# Patient Record
Sex: Male | Born: 1970 | Race: Black or African American | Hispanic: No | Marital: Married | Smoking: Current every day smoker
Health system: Southern US, Community
[De-identification: ages and names within clinical notes are randomized; demographics above are authoritative.]

## PROBLEM LIST (undated history)

## (undated) ENCOUNTER — Emergency Department (HOSPITAL_BASED_OUTPATIENT_CLINIC_OR_DEPARTMENT_OTHER): Admission: EM | Payer: BC Managed Care – PPO | Source: Home / Self Care

## (undated) DIAGNOSIS — E119 Type 2 diabetes mellitus without complications: Secondary | ICD-10-CM

## (undated) DIAGNOSIS — I10 Essential (primary) hypertension: Secondary | ICD-10-CM

## (undated) HISTORY — PX: TONSILLECTOMY: SUR1361

## (undated) HISTORY — PX: ANKLE SURGERY: SHX546

## (undated) HISTORY — PX: CHOLECYSTECTOMY: SHX55

---

## 2013-02-07 ENCOUNTER — Emergency Department (HOSPITAL_COMMUNITY)
Admission: EM | Admit: 2013-02-07 | Discharge: 2013-02-07 | Disposition: A | Discharge status: Home / Self Care | Payer: Self-pay | Attending: Emergency Medicine | Admitting: Emergency Medicine

## 2013-02-07 ENCOUNTER — Encounter (HOSPITAL_COMMUNITY): Payer: Self-pay | Admitting: Emergency Medicine

## 2013-02-07 DIAGNOSIS — L84 Corns and callosities: Secondary | ICD-10-CM | POA: Insufficient documentation

## 2013-02-07 DIAGNOSIS — L600 Ingrowing nail: Secondary | ICD-10-CM | POA: Insufficient documentation

## 2013-02-07 DIAGNOSIS — F17200 Nicotine dependence, unspecified, uncomplicated: Secondary | ICD-10-CM | POA: Insufficient documentation

## 2013-02-07 DIAGNOSIS — W57XXXA Bitten or stung by nonvenomous insect and other nonvenomous arthropods, initial encounter: Secondary | ICD-10-CM

## 2013-02-07 DIAGNOSIS — I10 Essential (primary) hypertension: Secondary | ICD-10-CM | POA: Insufficient documentation

## 2013-02-07 HISTORY — DX: Essential (primary) hypertension: I10

## 2013-02-07 NOTE — ED Provider Notes (Signed)
Medical screening examination/treatment/procedure(s) were performed by non-physician practitioner and as supervising physician I was immediately available for consultation/collaboration.  Keiran Sias M Aaryn Sermon, MD 02/07/13 0410 

## 2013-02-07 NOTE — ED Provider Notes (Signed)
History     CSN: [id number]  Arrival date & time 02/07/13  0151   First MD Initiated Contact with Patient 02/07/13 0242      Chief Complaint  Patient presents with  . Ingrown Toenail  . Insect Bite    (Consider location/radiation/quality/duration/timing/severity/associated sxs/prior treatment) HPI Comments: Patient presents to the emergency room tonight, at 3 AM because he has a callus on the lateral aspect of his right fifth toe.  That has been painful for the past 2, states she's tried soaking it and the callus.  Has not fallen off.  He has not trimmed or file.  This area at all nor cut his toenail because it is "too painful."  He states while he was in prison.  He had an ingrown, toenail on that toe, and they supposedly burned.  The nail off, but it has regrown.  He also has an area on his left flank.  There is a small, punctate area.  That is open.  He, states he had a pimple-like appearance, that he scratched.  Now the head is open.  There is no drainage, pus, or surrounding erythema  The history is provided by the patient.    Past Medical History  Diagnosis Date  . Hypertension     Past Surgical History  Procedure Laterality Date  . Ankle surgery      No family history on file.  History  Substance Use Topics  . Smoking status: Current Every Day Smoker  . Smokeless tobacco: Not on file  . Alcohol Use: Yes     Comment: occ      Review of Systems  Constitutional: Negative for fever.  Respiratory: Negative for shortness of breath.   Cardiovascular: Negative for chest pain.  Musculoskeletal: Negative for joint swelling.  Skin: Positive for wound.  All other systems reviewed and are negative.    Allergies  Review of patient's allergies indicates no known allergies.  Home Medications  No current outpatient prescriptions on file.  BP 149/95  Pulse 96  Temp(Src) 98.2 F (36.8 C) (Oral)  Resp 17  Ht 6\' 6"  (1.981 m)  Wt 346 lb (156.945 kg)  BMI 39.99 kg/m2   SpO2 97%  Physical Exam  Nursing note and vitals reviewed. Constitutional: He is oriented to person, place, and time. He appears well-developed and well-nourished.  Obese  HENT:  Head: Normocephalic and atraumatic.  Eyes: Pupils are equal, round, and reactive to light.  Cardiovascular: Normal rate.   Pulmonary/Chest: He is in respiratory distress.    Musculoskeletal: Normal range of motion. He exhibits tenderness. He exhibits no edema.       Feet:  Neurological: He is alert and oriented to person, place, and time.  Skin: Skin is warm and dry. No rash noted. No erythema. No pallor.    ED Course  Procedures (including critical care time)  Labs Reviewed - No data to display No results found.   1. Callus of foot   2. Insect bite       MDM   Patient was instructed in care of cutting his toenails and how to care for thickened callus on his toes and feet Is no indication of ingrown toenails.  There is no erythema, swelling, pus, in the cuticle area or around the toenail      Arman Filter, NP 02/07/13 [id number]  Arman Filter, NP 02/07/13 [phone]

## 2013-02-07 NOTE — ED Notes (Signed)
Pt c/o painful ingrown toenail to R 5th toe x 2 weeks and ?insect bite to L side.

## 2014-08-10 ENCOUNTER — Encounter (HOSPITAL_COMMUNITY): Payer: Self-pay | Admitting: Emergency Medicine

## 2014-08-10 ENCOUNTER — Emergency Department (HOSPITAL_COMMUNITY)
Admission: EM | Admit: 2014-08-10 | Discharge: 2014-08-11 | Disposition: A | Discharge status: Home / Self Care | Payer: Medicaid Other | Attending: Emergency Medicine | Admitting: Emergency Medicine

## 2014-08-10 DIAGNOSIS — Z720 Tobacco use: Secondary | ICD-10-CM | POA: Diagnosis not present

## 2014-08-10 DIAGNOSIS — E1165 Type 2 diabetes mellitus with hyperglycemia: Secondary | ICD-10-CM | POA: Diagnosis not present

## 2014-08-10 DIAGNOSIS — L739 Follicular disorder, unspecified: Secondary | ICD-10-CM | POA: Diagnosis not present

## 2014-08-10 DIAGNOSIS — L02412 Cutaneous abscess of left axilla: Secondary | ICD-10-CM | POA: Diagnosis present

## 2014-08-10 DIAGNOSIS — I10 Essential (primary) hypertension: Secondary | ICD-10-CM | POA: Diagnosis not present

## 2014-08-10 DIAGNOSIS — R739 Hyperglycemia, unspecified: Secondary | ICD-10-CM

## 2014-08-10 HISTORY — DX: Type 2 diabetes mellitus without complications: E11.9

## 2014-08-10 LAB — CBG MONITORING, ED: Glucose-Capillary: 267 mg/dL — ABNORMAL HIGH (ref 70–99)

## 2014-08-10 NOTE — ED Notes (Signed)
Pt c/o abscesses to abdomen, legs, back, axillary area. Some draining. Pt is diabetic has not had meds in over a month. Pt also states he has BM's 6-7 x a day.

## 2014-08-11 LAB — BASIC METABOLIC PANEL
Anion gap: 15 (ref 5–15)
BUN: 14 mg/dL (ref 6–23)
CHLORIDE: 96 meq/L (ref 96–112)
CO2: 23 meq/L (ref 19–32)
Calcium: 9.3 mg/dL (ref 8.4–10.5)
Creatinine, Ser: 1.05 mg/dL (ref 0.50–1.35)
GFR calc Af Amer: >90 mL/min (ref >90)
GFR calc non Af Amer: 86 mL/min — ABNORMAL LOW (ref >90)
GLUCOSE: 294 mg/dL — AB (ref 70–99)
POTASSIUM: 3.9 meq/L (ref 3.7–5.3)
Sodium: 134 mEq/L — ABNORMAL LOW (ref 137–147)

## 2014-08-11 MED ORDER — METFORMIN HCL 500 MG PO TABS: 500.0000 mg | ORAL_TABLET | Freq: Two times a day (BID) | ORAL | Status: DC

## 2014-08-11 MED ORDER — DOXYCYCLINE HYCLATE 100 MG PO TABS
100.0000 mg | ORAL_TABLET | Freq: Once | ORAL | Status: AC
  Administered 2014-08-11: 100 mg via ORAL
  Filled 2014-08-11: qty 1

## 2014-08-11 MED ORDER — OXYCODONE-ACETAMINOPHEN 5-325 MG PO TABS
1.0000 | ORAL_TABLET | Freq: Once | ORAL | Status: AC
  Administered 2014-08-11: 1 via ORAL
  Filled 2014-08-11: qty 1

## 2014-08-11 MED ORDER — DOXYCYCLINE HYCLATE 100 MG PO TABS: 100.0000 mg | ORAL_TABLET | Freq: Two times a day (BID) | ORAL | Status: DC

## 2014-08-11 MED ORDER — METFORMIN HCL 1000 MG PO TABS: 1000.0000 mg | ORAL_TABLET | Freq: Two times a day (BID) | ORAL | Status: DC

## 2014-08-11 MED ORDER — HYDROCODONE-ACETAMINOPHEN 5-325 MG PO TABS: 1.0000 | ORAL_TABLET | ORAL | Status: DC | PRN

## 2014-08-11 NOTE — ED Provider Notes (Signed)
CSN: [id number]     Arrival date & time 08/10/14  2322 History   First MD Initiated Contact with Patient 08/11/14 0007     Chief Complaint  Patient presents with  . Abscess     (Consider location/radiation/quality/duration/timing/severity/associated sxs/prior Treatment) Patient is a 43 y.o. male presenting with abscess. The history is provided by the patient and the spouse. No language interpreter was used.  Abscess Location:  Torso Torso abscess location:  L axilla and R axilla Red streaking: no   Duration:  1 week Associated symptoms: no fever   Associated symptoms comment:  Multiple painful, swollen areas to lateral torso bilaterally. No fever. He reports small blisters associated with some of the areas. He has had similar symptoms in the past. He reports a history of diabetes and that he has not had his Metformin in over one month. No nausea, vomiting. He does report thirst and urinary frequency, as well as multiple well formed stools throughout the day.   Past Medical History  Diagnosis Date  . Hypertension   . Diabetes mellitus without complication    Past Surgical History  Procedure Laterality Date  . Ankle surgery     No family history on file. History  Substance Use Topics  . Smoking status: Current Every Day Smoker  . Smokeless tobacco: Not on file  . Alcohol Use: Yes     Comment: occ    Review of Systems  Constitutional: Negative for fever and chills.  HENT: Negative.   Respiratory: Negative.   Cardiovascular: Negative.   Gastrointestinal: Negative.   Endocrine: Positive for polydipsia and polyuria.  Musculoskeletal: Negative.   Skin: Positive for rash.  Neurological: Negative.       Allergies  Review of patient's allergies indicates no known allergies.  Home Medications   Prior to Admission medications   Medication Sig Start Date End Date Taking? Authorizing Provider  ibuprofen (ADVIL,MOTRIN) 200 MG tablet Take 400 mg by mouth every 6 (six)  hours as needed for moderate pain.   Yes Historical Provider, MD   BP 157/96 mmHg  Pulse 100  Temp(Src) 97.8 F (36.6 C) (Oral)  Resp 20  Ht 6\' 7"  (2.007 m)  Wt 330 lb (149.687 kg)  BMI 37.16 kg/m2  SpO2 98% Physical Exam  Constitutional: He is oriented to person, place, and time. He appears well-developed and well-nourished.  Neck: Normal range of motion.  Pulmonary/Chest: Effort normal.  Musculoskeletal: Normal range of motion.  Neurological: He is alert and oriented to person, place, and time.  Skin: Skin is warm and dry.  Multiple red, tender, swollen areas, many with small pustules that are intact c/w folliculitis. No drainable abscesses.   Psychiatric: He has a normal mood and affect.    ED Course  Procedures (including critical care time) Labs Review Labs Reviewed  BASIC METABOLIC PANEL - Abnormal; Notable for the following:    Sodium 134 (*)    Glucose, Bld 294 (*)    GFR calc non Af Amer 86 (*)    All other components within normal limits  CBG MONITORING, ED - Abnormal; Notable for the following:    Glucose-Capillary 267 (*)    All other components within normal limits    Imaging Review No results found.   EKG Interpretation None      MDM   Final diagnoses:  None    1. Folliculitis 2. Uncontrolled DM w/o acidosis  He is very well appearing, NAD. No evidence of acidosis though hyperglycemia of 294.  He states he drank to regular sodas prior to arrival in the ED. He is stable for discharge home.     Arnoldo HookerShari A Shanvi Moyd, PA-C 08/11/14 [phone]  Ward GivensIva L Knapp, MD 08/11/14 [phone]

## 2014-08-11 NOTE — Discharge Instructions (Signed)
Hyperglycemia °Hyperglycemia occurs when the glucose (sugar) in your blood is too high. Hyperglycemia can happen for many reasons, but it most often happens to people who do not know they have diabetes or are not managing their diabetes properly.  °CAUSES  °Whether you have diabetes or not, there are other causes of hyperglycemia. Hyperglycemia can occur when you have diabetes, but it can also occur in other situations that you might not be as aware of, such as: °Diabetes °· If you have diabetes and are having problems controlling your blood glucose, hyperglycemia could occur because of some of the following reasons: °¨ Not following your meal plan. °¨ Not taking your diabetes medications or not taking it properly. °¨ Exercising less or doing less activity than you normally do. °¨ Being sick. °Pre-diabetes °· This cannot be ignored. Before people develop Type 2 diabetes, they almost always have "pre-diabetes." This is when your blood glucose levels are higher than normal, but not yet high enough to be diagnosed as diabetes. Research has shown that some long-term damage to the body, especially the heart and circulatory system, may already be occurring during pre-diabetes. If you take action to manage your blood glucose when you have pre-diabetes, you may delay or prevent Type 2 diabetes from developing. °Stress °· If you have diabetes, you may be "diet" controlled or on oral medications or insulin to control your diabetes. However, you may find that your blood glucose is higher than usual in the hospital whether you have diabetes or not. This is often referred to as "stress hyperglycemia." Stress can elevate your blood glucose. This happens because of hormones put out by the body during times of stress. If stress has been the cause of your high blood glucose, it can be followed regularly by your caregiver. That way he/she can make sure your hyperglycemia does not continue to get worse or progress to  diabetes. °Steroids °· Steroids are medications that act on the infection fighting system (immune system) to block inflammation or infection. One side effect can be a rise in blood glucose. Most people can produce enough extra insulin to allow for this rise, but for those who cannot, steroids make blood glucose levels go even higher. It is not unusual for steroid treatments to "uncover" diabetes that is developing. It is not always possible to determine if the hyperglycemia will go away after the steroids are stopped. A special blood test called an A1c is sometimes done to determine if your blood glucose was elevated before the steroids were started. °SYMPTOMS °· Thirsty. °· Frequent urination. °· Dry mouth. °· Blurred vision. °· Tired or fatigue. °· Weakness. °· Sleepy. °· Tingling in feet or leg. °DIAGNOSIS  °Diagnosis is made by monitoring blood glucose in one or all of the following ways: °· A1c test. This is a chemical found in your blood. °· Fingerstick blood glucose monitoring. °· Laboratory results. °TREATMENT  °First, knowing the cause of the hyperglycemia is important before the hyperglycemia can be treated. Treatment may include, but is not be limited to: °· Education. °· Change or adjustment in medications. °· Change or adjustment in meal plan. °· Treatment for an illness, infection, etc. °· More frequent blood glucose monitoring. °· Change in exercise plan. °· Decreasing or stopping steroids. °· Lifestyle changes. °HOME CARE INSTRUCTIONS  °· Test your blood glucose as directed. °· Exercise regularly. Your caregiver will give you instructions about exercise. Pre-diabetes or diabetes which comes on with stress is helped by exercising. °· Eat wholesome,   balanced meals. Eat often and at regular, fixed times. Your caregiver or nutritionist will give you a meal plan to guide your sugar intake.  Being at an ideal weight is important. If needed, losing as little as 10 to 15 pounds may help improve blood  glucose levels. SEEK MEDICAL CARE IF:   You have questions about medicine, activity, or diet.  You continue to have symptoms (problems such as increased thirst, urination, or weight gain). SEEK IMMEDIATE MEDICAL CARE IF:   You are vomiting or have diarrhea.  Your breath smells fruity.  You are breathing faster or slower.  You are very sleepy or incoherent.  You have numbness, tingling, or pain in your feet or hands.  You have chest pain.  Your symptoms get worse even though you have been following your caregiver's orders.  If you have any other questions or concerns. Document Released: 02/01/2001 Document Revised: 10/31/2011 Document Reviewed: 12/05/2011 [facility] Patient Information 2015 [place], [place]. This information is not intended to replace advice given to you by your health care provider. Make sure you discuss any questions you have with your health care provider. Folliculitis  Folliculitis is redness, soreness, and swelling (inflammation) of the hair follicles. This condition can occur anywhere on the body. People with weakened immune systems, diabetes, or obesity have a greater risk of getting folliculitis. CAUSES  Bacterial infection. This is the most common cause.  Fungal infection.  Viral infection.  Contact with certain chemicals, especially oils and tars. Long-term folliculitis can result from bacteria that live in the nostrils. The bacteria may trigger multiple outbreaks of folliculitis over time. SYMPTOMS Folliculitis most commonly occurs on the scalp, thighs, legs, back, buttocks, and areas where hair is shaved frequently. An early sign of folliculitis is a small, white or yellow, pus-filled, itchy lesion (pustule). These lesions appear on a red, inflamed follicle. They are usually less than 0.2 inches (5 mm) wide. When there is an infection of the follicle that goes deeper, it becomes a boil or furuncle. A group of closely packed boils creates a larger lesion  (carbuncle). Carbuncles tend to occur in hairy, sweaty areas of the body. DIAGNOSIS  Your caregiver can usually tell what is wrong by doing a physical exam. A sample may be taken from one of the lesions and tested in a lab. This can help determine what is causing your folliculitis. TREATMENT  Treatment may include:  Applying warm compresses to the affected areas.  Taking antibiotic medicines orally or applying them to the skin.  Draining the lesions if they contain a large amount of pus or fluid.  Laser hair removal for cases of long-lasting folliculitis. This helps to prevent regrowth of the hair. HOME CARE INSTRUCTIONS  Apply warm compresses to the affected areas as directed by your caregiver.  If antibiotics are prescribed, take them as directed. Finish them even if you start to feel better.  You may take over-the-counter medicines to relieve itching.  Do not shave irritated skin.  Follow up with your caregiver as directed. SEEK IMMEDIATE MEDICAL CARE IF:   You have increasing redness, swelling, or pain in the affected area.  You have a fever. MAKE SURE YOU:  Understand these instructions.  Will watch your condition.  Will get help right away if you are not doing well or get worse. Document Released: 10/17/2001 Document Revised: 02/07/2012 Document Reviewed: 11/08/2011 [facility] Patient Information 2015 [place], [place]. This information is not intended to replace advice given to you by your health care provider.  Make sure you discuss any questions you have with your health care provider. ° °

## 2014-09-18 ENCOUNTER — Encounter (HOSPITAL_COMMUNITY): Payer: Self-pay | Admitting: *Deleted

## 2014-09-18 ENCOUNTER — Emergency Department (HOSPITAL_COMMUNITY)
Admission: EM | Admit: 2014-09-18 | Discharge: 2014-09-18 | Disposition: A | Discharge status: Home / Self Care | Payer: Medicaid Other | Attending: Emergency Medicine | Admitting: Emergency Medicine

## 2014-09-18 DIAGNOSIS — E119 Type 2 diabetes mellitus without complications: Secondary | ICD-10-CM | POA: Insufficient documentation

## 2014-09-18 DIAGNOSIS — Z79899 Other long term (current) drug therapy: Secondary | ICD-10-CM | POA: Diagnosis not present

## 2014-09-18 DIAGNOSIS — Z792 Long term (current) use of antibiotics: Secondary | ICD-10-CM | POA: Diagnosis not present

## 2014-09-18 DIAGNOSIS — L739 Follicular disorder, unspecified: Secondary | ICD-10-CM | POA: Diagnosis not present

## 2014-09-18 DIAGNOSIS — I10 Essential (primary) hypertension: Secondary | ICD-10-CM | POA: Diagnosis not present

## 2014-09-18 DIAGNOSIS — Z720 Tobacco use: Secondary | ICD-10-CM | POA: Diagnosis not present

## 2014-09-18 DIAGNOSIS — L02412 Cutaneous abscess of left axilla: Secondary | ICD-10-CM | POA: Diagnosis present

## 2014-09-18 MED ORDER — HYDROCODONE-ACETAMINOPHEN 5-325 MG PO TABS: 1.0000 | ORAL_TABLET | Freq: Four times a day (QID) | ORAL | Status: DC | PRN

## 2014-09-18 MED ORDER — DOXYCYCLINE HYCLATE 100 MG PO TABS: 100.0000 mg | ORAL_TABLET | Freq: Two times a day (BID) | ORAL | Status: DC

## 2014-09-18 NOTE — ED Provider Notes (Signed)
CSN: [id number]     Arrival date & time 09/18/14  1810 History   None    This chart was scribed for non-physician practitioner, Fayrene HelperBowie Luv Mish PA-C working with Gwyneth SproutWhitney Plunkett, MD by Arlan OrganAshley Leger, ED Scribe. This patient was seen in room WTR6/WTR6 and the patient's care was started at 6:23 PM.   No chief complaint on file.  HPI  HPI Comments: Lawrence Fisher is a 44 y.o. male with a PMHx of HTN and DM who presents to the Emergency Department complaining of multiple, ongoing, painful boils below axilla bilaterally, hips, buttocks, and L lower leg x 1 month. Discomfort is worsened with palpation and with certain positioning. Pt was seen previously approximately 1 month ago for same and was prescribed a short course of an antibiotic. Mr. Pernell Dupredams states he completed all antibiotic prescribed at last visit without any improvement for symptoms. He admits to sitting in hot jacuzzis once a month. No recent fever or chills. Pt is taking Metformin daily and admits to new onset frequent urination and bowel movements since starting medication. He is not currently established with a PCP as he is having difficulty finding a provider that will accept his form of insurance. No known allergies to medications.  Past Medical History  Diagnosis Date  . Hypertension   . Diabetes mellitus without complication    Past Surgical History  Procedure Laterality Date  . Ankle surgery     No family history on file. History  Substance Use Topics  . Smoking status: Current Every Day Smoker  . Smokeless tobacco: Not on file  . Alcohol Use: Yes     Comment: occ    Review of Systems  Constitutional: Negative for fever and chills.  Skin: Positive for rash.      Allergies  Review of patient's allergies indicates no known allergies.  Home Medications   Prior to Admission medications   Medication Sig Start Date End Date Taking? Authorizing Provider  doxycycline (VIBRA-TABS) 100 MG tablet Take 1 tablet (100 mg total) by  mouth 2 (two) times daily. 08/11/14   Shari A Upstill, PA-C  HYDROcodone-acetaminophen (NORCO/VICODIN) 5-325 MG per tablet Take 1-2 tablets by mouth every 4 (four) hours as needed. 08/11/14   Shari A Upstill, PA-C  ibuprofen (ADVIL,MOTRIN) 200 MG tablet Take 400 mg by mouth every 6 (six) hours as needed for moderate pain.    Historical Provider, MD  metFORMIN (GLUCOPHAGE) 1000 MG tablet Take 1 tablet (1,000 mg total) by mouth 2 (two) times daily. 08/11/14   Shari A Upstill, PA-C  metFORMIN (GLUCOPHAGE) 500 MG tablet Take 1 tablet (500 mg total) by mouth 2 (two) times daily with a meal. 08/11/14   Shari A Upstill, PA-C   Triage Vitals: BP 155/96 mmHg  Pulse 108  Temp(Src) 97.9 F (36.6 C) (Oral)  Resp 18  SpO2 98%   Physical Exam  Constitutional: He is oriented to person, place, and time. He appears well-developed and well-nourished.  HENT:  Head: Normocephalic.  Mouth/Throat: Oropharynx is clear and moist.  Oral mucosa free of lesions  Eyes: EOM are normal.  Neck: Normal range of motion.  Cardiovascular: Normal rate, regular rhythm and normal heart sounds.   Pulmonary/Chest: Effort normal.  Abdominal: He exhibits no distension.  Musculoskeletal: Normal range of motion.  Neurological: He is alert and oriented to person, place, and time.  Skin: Rash noted.  Folliculitis rash noted bilateral axilla region, along trunk, chest, bilateral lower extremities, hips, and buttocks that are tenderness to palpation  Some lesion with pustular discharge  Psychiatric: He has a normal mood and affect.  Nursing note and vitals reviewed.   ED Course  Procedures (including critical care time)  DIAGNOSTIC STUDIES: Oxygen Saturation is 98% on RA, Normal by my interpretation.    COORDINATION OF CARE: 6:23 PM- evidence of folliculitis on exam, no discrete large abscess amenable for drainage. Will prescribe longer course of antibiotic and advised pt to establish with a PCP to manage DM and HTN.  Discussed treatment plan with pt at bedside and pt agreed to plan.     Labs Review Labs Reviewed - No data to display  Imaging Review No results found.   EKG Interpretation None      MDM   Final diagnoses:  Folliculitis    BP 155/96 mmHg  Pulse 103  Temp(Src) 98.5 F (36.9 C) (Oral)  Resp 18  SpO2 96%  I personally performed the services described in this documentation, which was scribed in my presence. The recorded information has been reviewed and is accurate.    [name], PA-C 09/18/14 [id number]  Gwyneth Sprout, MD 09/19/14 0002

## 2014-09-18 NOTE — ED Notes (Signed)
Pt reports abscesses in several areas in his body.  Reports one in bila axillary, L leg, and buttocks x 1 month.  Pt reports they are intermittent.  Pt is also diabetic.

## 2014-09-18 NOTE — Discharge Instructions (Signed)
Folliculitis  Folliculitis is redness, soreness, and swelling (inflammation) of the hair follicles. This condition can occur anywhere on the body. People with weakened immune systems, diabetes, or obesity have a greater risk of getting folliculitis. CAUSES  Bacterial infection. This is the most common cause.  Fungal infection.  Viral infection.  Contact with certain chemicals, especially oils and tars. Long-term folliculitis can result from bacteria that live in the nostrils. The bacteria may trigger multiple outbreaks of folliculitis over time. SYMPTOMS Folliculitis most commonly occurs on the scalp, thighs, legs, back, buttocks, and areas where hair is shaved frequently. An early sign of folliculitis is a small, white or yellow, pus-filled, itchy lesion (pustule). These lesions appear on a red, inflamed follicle. They are usually less than 0.2 inches (5 mm) wide. When there is an infection of the follicle that goes deeper, it becomes a boil or furuncle. A group of closely packed boils creates a larger lesion (carbuncle). Carbuncles tend to occur in hairy, sweaty areas of the body. DIAGNOSIS  Your caregiver can usually tell what is wrong by doing a physical exam. A sample may be taken from one of the lesions and tested in a lab. This can help determine what is causing your folliculitis. TREATMENT  Treatment may include:  Applying warm compresses to the affected areas.  Taking antibiotic medicines orally or applying them to the skin.  Draining the lesions if they contain a large amount of pus or fluid.  Laser hair removal for cases of long-lasting folliculitis. This helps to prevent regrowth of the hair. HOME CARE INSTRUCTIONS  Apply warm compresses to the affected areas as directed by your caregiver.  If antibiotics are prescribed, take them as directed. Finish them even if you start to feel better.  You may take over-the-counter medicines to relieve itching.  Do not shave  irritated skin.  Follow up with your caregiver as directed. SEEK IMMEDIATE MEDICAL CARE IF:   You have increasing redness, swelling, or pain in the affected area.  You have a fever. MAKE SURE YOU:  Understand these instructions.  Will watch your condition.  Will get help right away if you are not doing well or get worse. Document Released: 10/17/2001 Document Revised: 02/07/2012 Document Reviewed: 11/08/2011 [facility] Patient Information 2015 [place], [place]. This information is not intended to replace advice given to you by your health care provider. Make sure you discuss any questions you have with your health care provider.   Emergency Department Resource Guide 1) Find a Doctor and Pay Out of Pocket Although you won't have to find out who is covered by your insurance plan, it is a good idea to ask around and get recommendations. You will then need to call the office and see if the doctor you have chosen will accept you as a new patient and what types of options they offer for patients who are self-pay. Some doctors offer discounts or will set up payment plans for their patients who do not have insurance, but you will need to ask so you aren't surprised when you get to your appointment.  2) Contact Your Local Health Department Not all health departments have doctors that can see patients for sick visits, but many do, so it is worth a call to see if yours does. If you don't know where your local health department is, you can check in your phone book. The CDC also has a tool to help you locate your state's health department, and many state websites also have listings  of all of their local health departments.  3) Find a Walk-in Clinic If your illness is not likely to be very severe or complicated, you may want to try a walk in clinic. These are popping up all over the country in pharmacies, drugstores, and shopping centers. They're usually staffed by nurse practitioners or physician  assistants that have been trained to treat common illnesses and complaints. They're usually fairly quick and inexpensive. However, if you have serious medical issues or chronic medical problems, these are probably not your best option.  No Primary Care Doctor: - Call Health Connect at  [phone] - they can help you locate a primary care doctor that  accepts your insurance, provides certain services, etc. - Physician Referral Service- [phone]  Chronic Pain Problems: Organization         Address  Phone   Notes  [facility]  [phone] Patients need to be referred by their primary care doctor.   Medication Assistance: Organization         Address  Phone   Notes  [facility] Medication [facility] [address] [place], [place] [address] [phone]) [phone] --Must be a resident of [facility] -- Must have NO insurance coverage whatsoever (no Medicaid/ Medicare, etc.) -- The pt. MUST have a primary care doctor that directs their care regularly and follows them in the community   MedAssist  [phone]   [employer]  [phone]    Agencies that provide inexpensive medical care: Organization         Address  Phone   Notes  [facility]  [phone]   [facility]    [phone]   [facility] [address] [place], [place] [address] [phone]   [facility] [place] [address], [place] [phone]) [phone]   Planned Parenthood    [phone]   [facility]    [phone](336) [phone]   [facility]  [address], [place] Phone:  [phone], Fax:  [phone] Hours of Operation:  9 am - 6 pm, M-F.  Also accepts Medicaid/Medicare and self-pay.  [facility]  [address], [place] Phone: [phone], Fax: [phone]. Hours of Operation:  8:30 am - 5:30 pm, M-F.  Also accepts  Medicaid and self-pay.  [facility] [place] [address], [place] Phone: [phone]) [phone]   [facility] [address], [place], [place] [phone], Ext. [phone] Mondays & Thursdays: 7-9 AM.  First 15 patients are seen on a first come, first serve basis.    Medicaid-accepting [facility] Providers:  Organization         Address  Phone   Notes  [facility] [address], [place] [phone] Also accepts self-pay patients.  [facility] [address] [place], [place]  [phone]) [phone]   [facility] [address], [place] [phone]) [phone]   [facility] [address] [place] [address], [place] [phone](336) [phone]   Renaye RakersVeita Bland [address], [place]   [phone] Only accepts [place] Access [place]icaid patients after they have their name applied to their card.   Self-Pay (no insurance) in [facility] [place]:  Retail buyerrganization         Address  Phone   Notes  Sickle Cell Patients, [facility] [address], [place] [phone]) [phone]   [facility] [address], [place] [phone]   [facility] [place]  [address], [place] [phone]   [facility]/Dr. Osei-Bonsu  [address] [place] [address], [place] or [id number], [place] [phone] Phone number for both [place] and [place] locations is the same.  [facility] [address], [place] [phone]) [phone]   [facility] Anthem [address] [place] [address], [place] or [address] [phone](336) [phone]   [facility] [address], [place] [phone]) [phone], phone; [phone]) [phone], fax Sees patients 1st and 3rd Saturday of every month.  Must not qualify for public or private insurance (i.e. Medicaid, Medicare, [place] Health Choice, Veterans' Benefits)  Household  income should be no more than 200% of the poverty level The clinic cannot treat you if you are pregnant or think you are pregnant  Sexually transmitted diseases are not treated at the clinic.    Dental Care: Organization         Address  Phone  Notes  [facility] [address], [place] [phone] Accepts children up to age 43 who are enrolled in [place]icaid or [place] Health Choice; pregnant women with a Medicaid card; and children who have applied for Medicaid or [place] Health Choice, but were declined, whose parents can pay a reduced fee at time of service.  [facility] of [facility] [place]  [address], [place] [phone] Accepts children up to age 43 who are enrolled in [place]icaid or [place] Health Choice; pregnant women with a Medicaid card; and children who have applied for Medicaid or [place] Health Choice, but were declined, whose parents can pay a reduced fee at time of service.  [facility]  [address], [place] [phone]) [phone] Patients are seen by appointment only. Walk-ins are not accepted. [facility] will see patients 43 years of age and older. Monday - Tuesday (8am-5pm) Most Wednesdays (8:30-5pm) $30 per visit, cash only  [facility]  [address], [facility] [phone]) [phone] Patients are seen by appointment only. Walk-ins are not accepted. [facility] will see patients 43 years of age and older. One Wednesday Evening (Monthly: Volunteer Based).  $30 per visit, cash only  [employer] [facility] [facility]  [phone] for adults; Children under age 124, call Graduate Pediatric Dentistry at [phone]. Children aged 734-14, please call [phone] to request a pediatric application.  Dental services are provided in all areas of dental care including fillings, crowns and bridges, complete and partial dentures, implants, gum  treatment, root canals, and extractions. Preventive care is also provided. Treatment is provided to both adults and children. Patients are selected via a lottery and there is often a waiting list.   [facility] [address], [place]  [phone] [email or web address]   [facility] [address], [place], [place] [phone], Ext. [phone] Second and Fourth Thursday of each month, opens at 6:30 AM; Clinic ends at 9 AM.  Patients are seen on a first-come first-served basis, and a limited number are seen during each clinic.   [facility]  [address], [place], [place] [phone]) [phone]   Eligibility  Requirements You must have lived in [place], [place], or [place] counties for at least the last three months.   You cannot be eligible for state or federal sponsored National Cityhealthcare insurance, including CIGNAVeterans Administration, [place]icaid, or Harrah's EntertainmentMedicare.   You generally cannot be eligible for healthcare insurance through your employer.    How to apply: Eligibility screenings are held every Tuesday and Wednesday afternoon from 1:00 pm until 4:00 pm. You do not need an appointment for the interview!  [facility] [address], [place], [place] [phone]   [facility]  [phone]   [facility]  [phone]   [facility]  [phone]    Behavioral Health Resources in the Community: Intensive Outpatient Programs Organization         Address  Phone  Notes  [facility] [address], [place], [place] [phone]   [facility] [address], [place], [place] [phone]   [facility] [address], [place], [place]  [phone]   [facility] [address],  [place], [place] [phone] or [phone]   Substance Abuse Resources Organization         Address  Phone  Notes  Alcohol and  Drug Services  [phone]   [facility]  [phone]   [facility]  [phone]   [facility]  [phone]   Residential & Outpatient Substance Abuse Program  [phone]   Psychological Services Organization         Address  Phone  Notes  [facility]  [phone]- [phone]   [facility]  [phone]- [phone]   [facility] [address], [place] [phone] or [phone]    Mobile Crisis Teams Organization         Address  Phone  Notes  [facility], Mobile Crisis Care Unit  [phone]   [facility]  [address] [place], [place] [phone]   Doristine LocksSharon DeEsch [address] [place] [phone]    Self-Help/Support Groups Organization         Address  Phone             Notes  [facility] [place] - variety of support groups  [phone]- [phone] Call for more information  Narcotics Anonymous (NA), [facility] [address], [place]  2 meetings at this location   Statisticianesidential Treatment Programs Organization         Address  Phone  Notes  [facility] [address],    [place]  [phone]   [facility]  [address], [place] [phone], [place], [place] [phone]   [facility] [address], [place] [phone] Admissions: 8am-3pm M-F  [facility] [address]    [place], [place] [phone]   [facility] [address], [place], [place] [phone]   [facility] [address]  [place], [place] [phone]   [facility] - Intensive Outpatient [address], [place], [place] [phone]   [facility] ([facility] [address]  [place], [place] [phone] or [phone]   [facility] ([facility]) [address] [place], [place] [phone] Accepts Medicaid  Fellowship  [facility] [address]  [place] [phone] Substance Abuse/Addiction Treatment   [facility]al Health Resources Organization  Address  Phone  Notes  °[facility]  (888) [phone]   °Julie Brannon, PhD [address] [place], [place]   (336) [phone] or (336) 951-0000   °[facility]   [address] °[place], [place] (336) 349-4454   °[facility] [address], [place], [place] (336) [phone] Insurance/Medicaid/sponsorship through [facility]  °[facility] [address]                                    [place], [place] ([phone]) [phone] Therapy/tele-psych/case  °[facility] [address]  ° [place], [place] ([phone]) [phone]    °Dr. Arfeen  ([phone]) [phone]   °[facility] [place]  [employer] [place] [facility] 1) [address], [place] °2) [address], [place] °3)  [address], [place] (336) [phone] °(336) [phone] ° °(336) [phone]   °[place] Child Abuse Hotline (336) 342-1394 or (336) 342-3537 (After Hours)    ° ° ° °

## 2015-01-21 ENCOUNTER — Encounter (HOSPITAL_COMMUNITY): Payer: Self-pay | Admitting: Emergency Medicine

## 2015-01-21 ENCOUNTER — Emergency Department (HOSPITAL_COMMUNITY)
Admission: EM | Admit: 2015-01-21 | Discharge: 2015-01-21 | Discharge status: Against Medical Advice | Payer: Medicaid Other | Attending: Emergency Medicine | Admitting: Emergency Medicine

## 2015-01-21 DIAGNOSIS — R197 Diarrhea, unspecified: Secondary | ICD-10-CM | POA: Insufficient documentation

## 2015-01-21 DIAGNOSIS — Z720 Tobacco use: Secondary | ICD-10-CM | POA: Diagnosis not present

## 2015-01-21 DIAGNOSIS — E119 Type 2 diabetes mellitus without complications: Secondary | ICD-10-CM | POA: Insufficient documentation

## 2015-01-21 DIAGNOSIS — I10 Essential (primary) hypertension: Secondary | ICD-10-CM | POA: Diagnosis not present

## 2015-01-21 DIAGNOSIS — R1110 Vomiting, unspecified: Secondary | ICD-10-CM | POA: Diagnosis present

## 2015-01-21 LAB — CBC WITH DIFFERENTIAL/PLATELET
BASOS ABS: 0 10*3/uL (ref 0.0–0.1)
Basophils Relative: 0 % (ref 0–1)
Eosinophils Absolute: 0 10*3/uL (ref 0.0–0.7)
Eosinophils Relative: 0 % (ref 0–5)
HCT: 48.1 % (ref 39.0–52.0)
Hemoglobin: 16.2 g/dL (ref 13.0–17.0)
Lymphocytes Relative: 6 % — ABNORMAL LOW (ref 12–46)
Lymphs Abs: 0.6 10*3/uL — ABNORMAL LOW (ref 0.7–4.0)
MCH: 26.3 pg (ref 26.0–34.0)
MCHC: 33.7 g/dL (ref 30.0–36.0)
MCV: 78.1 fL (ref 78.0–100.0)
Monocytes Absolute: 0.4 10*3/uL (ref 0.1–1.0)
Monocytes Relative: 4 % (ref 3–12)
Neutro Abs: 9 10*3/uL — ABNORMAL HIGH (ref 1.7–7.7)
Neutrophils Relative %: 90 % — ABNORMAL HIGH (ref 43–77)
Platelets: 267 10*3/uL (ref 150–400)
RBC: 6.16 MIL/uL — AB (ref 4.22–5.81)
RDW: 15.6 % — ABNORMAL HIGH (ref 11.5–15.5)
WBC: 9.9 10*3/uL (ref 4.0–10.5)

## 2015-01-21 LAB — COMPREHENSIVE METABOLIC PANEL
ALT: 24 U/L (ref 17–63)
AST: 21 U/L (ref 15–41)
Albumin: 4.3 g/dL (ref 3.5–5.0)
Alkaline Phosphatase: 64 U/L (ref 38–126)
Anion gap: 9 (ref 5–15)
BUN: 14 mg/dL (ref 6–20)
CALCIUM: 8.9 mg/dL (ref 8.9–10.3)
CHLORIDE: 105 mmol/L (ref 101–111)
CO2: 22 mmol/L (ref 22–32)
Creatinine, Ser: 0.94 mg/dL (ref 0.61–1.24)
GFR calc Af Amer: >60 mL/min (ref >60)
GFR calc non Af Amer: >60 mL/min (ref >60)
Glucose, Bld: 155 mg/dL — ABNORMAL HIGH (ref 65–99)
Potassium: 3.9 mmol/L (ref 3.5–5.1)
Sodium: 136 mmol/L (ref 135–145)
Total Bilirubin: 0.9 mg/dL (ref 0.3–1.2)
Total Protein: 8.2 g/dL — ABNORMAL HIGH (ref 6.5–8.1)

## 2015-01-21 NOTE — ED Notes (Signed)
Per family member, vomiting and diarrhea since early this am-groin tenderness as well-probaby from vomiting

## 2015-01-21 NOTE — ED Notes (Signed)
Pt left AMA. Staff explained to pt risks of leaving AMA. Pt states "I am not waiting." Pt refuses to sign upon leaving.

## 2015-06-12 ENCOUNTER — Encounter (HOSPITAL_BASED_OUTPATIENT_CLINIC_OR_DEPARTMENT_OTHER): Payer: Self-pay

## 2015-06-12 ENCOUNTER — Emergency Department (HOSPITAL_BASED_OUTPATIENT_CLINIC_OR_DEPARTMENT_OTHER)
Admission: EM | Admit: 2015-06-12 | Discharge: 2015-06-12 | Disposition: A | Discharge status: Home / Self Care | Payer: BLUE CROSS/BLUE SHIELD | Attending: Emergency Medicine | Admitting: Emergency Medicine

## 2015-06-12 DIAGNOSIS — R5383 Other fatigue: Secondary | ICD-10-CM | POA: Diagnosis not present

## 2015-06-12 DIAGNOSIS — L02214 Cutaneous abscess of groin: Secondary | ICD-10-CM | POA: Diagnosis not present

## 2015-06-12 DIAGNOSIS — I10 Essential (primary) hypertension: Secondary | ICD-10-CM | POA: Insufficient documentation

## 2015-06-12 DIAGNOSIS — Z720 Tobacco use: Secondary | ICD-10-CM | POA: Insufficient documentation

## 2015-06-12 DIAGNOSIS — E1165 Type 2 diabetes mellitus with hyperglycemia: Secondary | ICD-10-CM | POA: Insufficient documentation

## 2015-06-12 DIAGNOSIS — R000 Tachycardia, unspecified: Secondary | ICD-10-CM | POA: Insufficient documentation

## 2015-06-12 DIAGNOSIS — R1011 Right upper quadrant pain: Secondary | ICD-10-CM | POA: Insufficient documentation

## 2015-06-12 DIAGNOSIS — R739 Hyperglycemia, unspecified: Secondary | ICD-10-CM

## 2015-06-12 DIAGNOSIS — R1012 Left upper quadrant pain: Secondary | ICD-10-CM | POA: Diagnosis not present

## 2015-06-12 DIAGNOSIS — H538 Other visual disturbances: Secondary | ICD-10-CM | POA: Insufficient documentation

## 2015-06-12 DIAGNOSIS — Z79899 Other long term (current) drug therapy: Secondary | ICD-10-CM | POA: Diagnosis not present

## 2015-06-12 DIAGNOSIS — E860 Dehydration: Secondary | ICD-10-CM | POA: Diagnosis not present

## 2015-06-12 DIAGNOSIS — Z792 Long term (current) use of antibiotics: Secondary | ICD-10-CM | POA: Insufficient documentation

## 2015-06-12 LAB — CBC WITH DIFFERENTIAL/PLATELET
BASOS PCT: 0 %
Basophils Absolute: 0 10*3/uL (ref 0.0–0.1)
EOS ABS: 0.2 10*3/uL (ref 0.0–0.7)
EOS PCT: 2 %
HCT: 45.9 % (ref 39.0–52.0)
Hemoglobin: 15.6 g/dL (ref 13.0–17.0)
Lymphocytes Relative: 21 %
Lymphs Abs: 1.9 10*3/uL (ref 0.7–4.0)
MCH: 26.1 pg (ref 26.0–34.0)
MCHC: 34 g/dL (ref 30.0–36.0)
MCV: 76.8 fL — AB (ref 78.0–100.0)
Monocytes Absolute: 0.7 10*3/uL (ref 0.1–1.0)
Monocytes Relative: 8 %
Neutro Abs: 6.4 10*3/uL (ref 1.7–7.7)
Neutrophils Relative %: 69 %
Platelets: 244 10*3/uL (ref 150–400)
RBC: 5.98 MIL/uL — ABNORMAL HIGH (ref 4.22–5.81)
RDW: 14.8 % (ref 11.5–15.5)
WBC: 9.1 10*3/uL (ref 4.0–10.5)

## 2015-06-12 LAB — BASIC METABOLIC PANEL
Anion gap: 5 (ref 5–15)
BUN: 17 mg/dL (ref 6–20)
CO2: 25 mmol/L (ref 22–32)
CREATININE: 1.3 mg/dL — AB (ref 0.61–1.24)
Calcium: 9 mg/dL (ref 8.9–10.3)
Chloride: 103 mmol/L (ref 101–111)
GFR calc non Af Amer: >60 mL/min (ref >60)
Glucose, Bld: 313 mg/dL — ABNORMAL HIGH (ref 65–99)
Potassium: 4.4 mmol/L (ref 3.5–5.1)
Sodium: 133 mmol/L — ABNORMAL LOW (ref 135–145)

## 2015-06-12 LAB — CBG MONITORING, ED: Glucose-Capillary: 328 mg/dL — ABNORMAL HIGH (ref 65–99)

## 2015-06-12 MED ORDER — SODIUM CHLORIDE 0.9 % IV BOLUS (SEPSIS)
500.0000 mL | Freq: Once | INTRAVENOUS | Status: AC
  Administered 2015-06-12: 500 mL via INTRAVENOUS

## 2015-06-12 MED ORDER — CLINDAMYCIN PHOSPHATE 600 MG/50ML IV SOLN
600.0000 mg | Freq: Once | INTRAVENOUS | Status: AC
  Administered 2015-06-12: 600 mg via INTRAVENOUS
  Filled 2015-06-12: qty 50

## 2015-06-12 MED ORDER — LISINOPRIL 20 MG PO TABS: 20.0000 mg | ORAL_TABLET | Freq: Every day | ORAL | Status: DC

## 2015-06-12 MED ORDER — SODIUM CHLORIDE 0.9 % IV BOLUS (SEPSIS)
1000.0000 mL | Freq: Once | INTRAVENOUS | Status: AC
  Administered 2015-06-12: 1000 mL via INTRAVENOUS

## 2015-06-12 MED ORDER — METFORMIN HCL 500 MG PO TABS: 500.0000 mg | ORAL_TABLET | Freq: Two times a day (BID) | ORAL | Status: DC

## 2015-06-12 MED ORDER — LISINOPRIL-HYDROCHLOROTHIAZIDE 20-25 MG PO TABS: 1.0000 | ORAL_TABLET | Freq: Every day | ORAL | Status: DC

## 2015-06-12 MED ORDER — GLIPIZIDE 5 MG PO TABS: 5.0000 mg | ORAL_TABLET | Freq: Every day | ORAL | Status: DC

## 2015-06-12 MED ORDER — CLINDAMYCIN HCL 300 MG PO CAPS: 150.0000 mg | ORAL_CAPSULE | Freq: Three times a day (TID) | ORAL | Status: DC

## 2015-06-12 MED ORDER — GLIPIZIDE 10 MG PO TABS: 10.0000 mg | ORAL_TABLET | Freq: Every day | ORAL | Status: DC

## 2015-06-12 NOTE — ED Provider Notes (Signed)
CSN: [id number]     Arrival date & time 06/12/15  1403 History   First MD Initiated Contact with Patient 06/12/15 1412     Chief Complaint  Patient presents with  . Hyperglycemia     (Consider location/radiation/quality/duration/timing/severity/associated sxs/prior Treatment) HPI Comments: [name] is 44 year old man with a history of DM (dx 1.5 years ago) who presents today with complaints of hyperglycemia, blurred vision, fatigue, polyuria and polydispia. Symptoms have been going on for the past 2 weeks. He reports he has been out of his diabetic medications for the past two months. He was seen at [facility] ED last night and left AMA as he was unhappy with the care. CBG was noted to be in the 300s there, treated with IVF and insulin prior to leaving. Today, his CBG was 328.   Patient also notes groin pain with areas draining pus.   Patient is a 44 y.o. male presenting with hyperglycemia. The history is provided by the patient.  Hyperglycemia Severity:  Moderate Chronicity:  Recurrent Diabetes status:  Controlled with oral medications Current diabetic therapy:  Metformin 500 mg bid, glipizide 10 mg daily Time since last antidiabetic medication:  2 months Context: noncompliance (insurance)   Associated symptoms: blurred vision, dehydration, dysuria, fatigue, increased thirst, malaise, polyuria and weakness   Associated symptoms: no abdominal pain, no altered mental status, no chest pain, no confusion, no diaphoresis, no dizziness, no fever, no nausea, no shortness of breath and no vomiting   Dysuria:    Duration:  2 weeks   Timing:  Constant   Progression:  Unchanged   Chronicity:  New Risk factors: family hx of diabetes and obesity   Risk factors: no recent steroid use     Past Medical History  Diagnosis Date  . Hypertension   . Diabetes mellitus without complication [facility](HCC)    Past Surgical History  Procedure Laterality Date  . Ankle surgery    . Tonsillectomy       History reviewed. No pertinent family history. Social History  Substance Use Topics  . Smoking status: Current Every Day Smoker  . Smokeless tobacco: None  . Alcohol Use: Yes     Comment: weekly    Review of Systems  Constitutional: Positive for fatigue. Negative for fever and diaphoresis.  Eyes: Positive for blurred vision and visual disturbance.  Respiratory: Negative for shortness of breath.   Cardiovascular: Negative for chest pain.  Gastrointestinal: Negative for nausea, vomiting and abdominal pain.  Endocrine: Positive for polydipsia and polyuria.  Genitourinary: Positive for dysuria.  Neurological: Positive for weakness. Negative for dizziness.  Psychiatric/Behavioral: Negative for confusion.      Allergies  Review of patient's allergies indicates no known allergies.  Home Medications   Prior to Admission medications   Medication Sig Start Date End Date Taking? Authorizing Provider  ibuprofen (ADVIL,MOTRIN) 200 MG tablet Take 400 mg by mouth every 6 (six) hours as needed for moderate pain.   Yes Historical Provider, MD  metFORMIN (GLUCOPHAGE) 1000 MG tablet Take 500 mg by mouth 2 (two) times daily with a meal.   Yes Historical Provider, MD  clindamycin (CLEOCIN) 300 MG capsule Take 1 capsule (300 mg total) by mouth 3 (three) times daily. 06/12/15   [name], MD  doxycycline (VIBRA-TABS) 100 MG tablet Take 1 tablet (100 mg total) by mouth 2 (two) times daily. 09/18/14   Fayrene HelperBowie Tran, PA-C  glipiZIDE (GLUCOTROL) 10 MG tablet Take 1 tablet (10 mg total) by mouth daily  before breakfast. 06/12/15   [name], MD  glipiZIDE (GLUCOTROL) 5 MG tablet Take 1 tablet (5 mg total) by mouth daily before breakfast. 06/12/15   [name], MD  HYDROcodone-acetaminophen (NORCO/VICODIN) 5-325 MG per tablet Take 1 tablet by mouth every 6 (six) hours as needed for moderate pain or severe pain. 09/18/14   [name], PA-C  lisinopril (PRINIVIL,ZESTRIL) 20 MG tablet Take 1 tablet  (20 mg total) by mouth daily. 06/12/15   [name], MD  lisinopril-hydrochlorothiazide (PRINZIDE,ZESTORETIC) 20-25 MG tablet Take 1 tablet by mouth daily. 06/12/15   [name], MD  metFORMIN (GLUCOPHAGE) 500 MG tablet Take 1 tablet (500 mg total) by mouth 2 (two) times daily with a meal. 06/12/15   [name], MD   BP 117/65 mmHg  Pulse 95  Temp(Src) 98.3 F (36.8 C) (Oral)  Resp 16  Ht  (1.981 m)  Wt 330 lb (149.687 kg)  BMI 38.14 kg/m2  SpO2 99% Physical Exam  Constitutional: He is oriented to person, place, and time. He appears well-developed and well-nourished. No distress.  HENT:  Head: Normocephalic and atraumatic.  Eyes: Conjunctivae and EOM are normal. Pupils are equal, round, and reactive to light.  Neck: Normal range of motion. Neck supple.  Cardiovascular: Regular rhythm, normal heart sounds and intact distal pulses.  Tachycardia present.  Exam reveals no gallop and no friction rub.   No murmur heard. Pulmonary/Chest: Effort normal and breath sounds normal. He has no wheezes.  Abdominal: Soft. Bowel sounds are normal. There is tenderness in the right upper quadrant and left upper quadrant. There is no rebound and no guarding.    Genitourinary:     Musculoskeletal: He exhibits no edema.  Neurological: He is alert and oriented to person, place, and time.  Skin: Skin is warm and dry. He is not diaphoretic.  Psychiatric: He has a normal mood and affect.    ED Course  .Marland KitchenIncision and Drainage Date/Time: 06/12/2015 3:58 PM Performed by: [name] Authorized by: [name] Risks and benefits: risks, benefits and alternatives were discussed Consent given by: patient Patient understanding: patient states understanding of the procedure being performed Patient consent: the patient's understanding of the procedure matches consent given Procedure consent: procedure consent matches procedure scheduled Relevant documents: relevant documents present  and verified Test results: test results available and properly labeled Site marked: the operative site was marked Imaging studies: imaging studies available Type: abscess Body area: lower extremity Location details: right leg Anesthesia: local infiltration Local anesthetic: co-phenylcaine spray Patient sedated: no Scalpel size: 11 Incision depth: dermal Complexity: simple Drainage: purulent and  serosanguinous Drainage amount: moderate Wound treatment: wound left open Packing material: Vaseline gauze Patient tolerance: Patient tolerated the procedure well with no immediate complications   (including critical care time) Labs Review Labs Reviewed  BASIC METABOLIC PANEL - Abnormal; Notable for the following:    Sodium 133 (*)    Glucose, Bld 313 (*)    Creatinine, Ser 1.30 (*)    All other components within normal limits  CBC WITH DIFFERENTIAL/PLATELET - Abnormal; Notable for the following:    RBC 5.98 (*)    MCV 76.8 (*)    All other components within normal limits  CBG MONITORING, ED - Abnormal; Notable for the following:    Glucose-Capillary 328 (*)    All other components within normal limits  WOUND CULTURE    Imaging Review No results found. I have personally reviewed and evaluated these images and lab results as part of  my medical decision-making.   EKG Interpretation None      MDM   Final diagnoses:  Groin abscess  Hyperglycemia  Dehydration   Mr. Haggard presented with hyperglycemia and dehydration secondary to medication non-compliance. He did have an AG of 19 last night at [facility] but gap had closed to 5 today. Did have a rise in his Cr from 1.08 last night to 1.33 today. Likely 2/2 volume depletion. Treated him with 1.5 L NS bolus in the ED. Will restart him on his home dose medications with prescriptions for 30 days and have him establish with a PCP for further care.   He also had a right groin abscess. I&D and sent for culture. Treated with one dose  IV clindamycin. Will send home with prescription of Clindamycin 300 mg PO tid for 7 days.    [name], MD 06/12/15 1606  [name], MD 06/18/15 [phone]

## 2015-06-12 NOTE — ED Notes (Signed)
Patient is ambulatory and stable.  Patient and wife verbalizes understanding of follow-up and medications.

## 2015-06-12 NOTE — Discharge Instructions (Signed)
Lawrence Fisher, your blood sugars have been elevated while you have been off your medications. You also had an abscess in your groin area that we drained which may have been contributing to your elevated sugars. You were a little dehydrated as well.   I am giving you a prescription for an antibiotic called Clindamycin. Please take it three (3) times a day until it runs out. I am also giving you a month supply of your Metformin, Glipizide and Lisinopril. It is very important that you establish with a primary care physician and follow up outpatient. I have given you a list of resources and physicians below.    Emergency Department Resource Guide 1) Find a Doctor and Pay Out of Pocket Although you won't have to find out who is covered by your insurance plan, it is a good idea to ask around and get recommendations. You will then need to call the office and see if the doctor you have chosen will accept you as a new patient and what types of options they offer for patients who are self-pay. Some doctors offer discounts or will set up payment plans for their patients who do not have insurance, but you will need to ask so you aren't surprised when you get to your appointment.  2) Contact Your Local Health Department Not all health departments have doctors that can see patients for sick visits, but many do, so it is worth a call to see if yours does. If you don't know where your local health department is, you can check in your phone book. The CDC also has a tool to help you locate your state's health department, and many state websites also have listings of all of their local health departments.  3) Find a Walk-in Clinic If your illness is not likely to be very severe or complicated, you may want to try a walk in clinic. These are popping up all over the country in pharmacies, drugstores, and shopping centers. They're usually staffed by nurse practitioners or physician assistants that have been trained to treat  common illnesses and complaints. They're usually fairly quick and inexpensive. However, if you have serious medical issues or chronic medical problems, these are probably not your best option.  No Primary Care Doctor: - Call Health Connect at  [phone] - they can help you locate a primary care doctor that  accepts your insurance, provides certain services, etc. - Physician Referral Service- [phone]  Chronic Pain Problems: Organization         Address  Phone   Notes  [facility]  [phone] Patients need to be referred by their primary care doctor.   Medication Assistance: Organization         Address  Phone   Notes  [facility] [address] [place] [place], [place] [address] [phone] --Must be a resident of [facility] -- Must have NO insurance coverage whatsoever (no Medicaid/ Medicare, etc.) -- The pt. MUST have a primary care doctor that directs their care regularly and follows them in the community   MedAssist  [phone]   [employer]  [phone]    Agencies that provide inexpensive medical care: Organization         Address  Phone   Notes  [facility]  [phone]   [facility]    [phone]   [facility] [address] [place], [place] [address] [phone]   [facility] [address], [place] [phone]   Planned Parenthood    [phone]   [facility]    [phone]   [facility]  [address], [place] Phone:  [phone], Fax:  [phone] Hours of Operation:  9 am - 6 pm, M-F.  Also accepts Medicaid/Medicare and self-pay.  [facility]  [address], [place] Phone: [phone], Fax: [phone]. Hours of Operation:  8:30 am - 5:30 pm, M-F.  Also accepts Medicaid and self-pay.  [facility] [place] [address] [place] Phone: [phone]   [facility] [address] Natasha Bence [place], [place] [phone], Ext. [phone] Mondays & Thursdays: 7-9 AM.  First 15 patients are seen on a first come, first serve basis.    Medicaid-accepting [facility] Providers:  Organization         Address  Phone   Notes  [facility] [address], [place] [phone] Also accepts self-pay patients.  [facility] [address] Laurell Josephs [place], [place]  [phone]   [facility] [address], [place] [phone]   [facility] [address], [place] [phone]   Renaye Rakers [address], [place]   [phone] Only accepts [place] Access [place] patients after they have their name applied to their card.   Self-Pay (no insurance) in [facility]:  Organization         Address  Phone   Notes  Sickle Cell Patients, [facility] [address] [place], [place] [phone]   [facility] [address] [place], [place] [phone]   [facility] [place]  [address], [place] [phone]   [facility]/Dr. Osei-Bonsu  [address] [place] or [address], [place] [phone] Phone number for both [place] and [place] locations is the same.  [facility] [address] [place] [phone]   [facility] [address] [place] or [address] [phone]   [facility] [address] [place] [phone], phone; [phone], fax Sees patients 1st and 3rd Saturday of every month.  Must not qualify for public or private insurance (i.e. Medicaid, Medicare, [place] Health Choice, Veterans' Benefits)  Household income should be no more than 200% of the  poverty level The clinic cannot treat you if you are pregnant or think you are pregnant  Sexually transmitted diseases are not treated at the clinic.    Dental Care: Organization         Address  Phone  Notes  [facility] Department of [facility] [address] [place], [place] [phone] Accepts children up to age 88 who are enrolled in [place] or [place] Health Choice; pregnant women with a Medicaid card; and children who have applied for Medicaid or [place] Health Choice, but were declined, whose parents can pay a reduced fee at time of service.  [facility] Department of [facility]  [address], [place] [phone] Accepts children up to age 58 who are enrolled in [place] or  Health Choice; pregnant women with  a Medicaid card; and children who have applied for Medicaid or [place] Health Choice, but were declined, whose parents can pay a reduced fee at time of service.  [facility]  [address], [place] [phone] Patients are seen by appointment only. Walk-ins are not accepted. [facility] will see patients 43 years of age and older. Monday - Tuesday (8am-5pm) Most Wednesdays (8:30-5pm) $30 per visit, cash only  [facility]  [address], [place] [facility] [place] [phone] Patients are seen by appointment only. Walk-ins are not accepted. [facility] will see patients 43 years of age and older. One Wednesday Evening (Monthly: Volunteer Based).  $30 per visit, cash only  [employer] [facility]  [phone] for adults; Children under age 444, call Graduate Pediatric Dentistry at [phone]. Children aged 444-14, please call [phone] to request a pediatric application.  Dental services are provided in all areas of dental care including fillings, crowns and bridges, complete and partial dentures, implants, gum treatment, root canals, and extractions.  Preventive care is also provided. Treatment is provided to both adults and children. Patients are selected via a lottery and there is often a waiting list.   [facility] [address], [place]  [phone] [email or web address]   [facility] [address], [place], [place] [phone], Ext. [phone] Second and Fourth Thursday of each month, opens at 6:30 AM; Clinic ends at 9 AM.  Patients are seen on a first-come first-served basis, and a limited number are seen during each clinic.   [facility]  [address], [place], [place] [phone]   Eligibility Requirements You must have lived in [place], [place], or [place] counties for at least the last three months.   You cannot be eligible for state or federal sponsored National Cityhealthcare insurance, including CIGNAVeterans Administration, [place]icaid, or [employer]icare.   You generally cannot be eligible for healthcare insurance through your employer.    How to apply: Eligibility screenings are held every Tuesday and Wednesday afternoon from 1:00 pm until 4:00 pm. You do not need an appointment for the interview!  [facility] [address], [place], [place] [phone]   [facility]  [phone]   [facility]  [phone]   [facility]  [phone]    Behavioral Health Resources in the Community: Intensive Outpatient Programs Organization         Address  Phone  Notes  [facility] [address], [place], [place] [phone]   [facility] [address], [place], [place] [phone]   [facility] [address], [place], [place]  [phone]   [facility] [address],  [place], [place] [phone] or [phone]   Substance Abuse Resources Organization         Address  Phone  Notes  Al[facility] Drug Services  [phone]   Addiction  [facility]  [phone]   [facility]  [phone]   [facility]  [phone]   Residential & Outpatient Substance Abuse Program  [phone]   Psychological Services Organization         Address  Phone  Notes  [facility]  [phone]- [phone]   [facility]  [phone]- [phone]   [facility] [address], [place] [phone] or [phone]    Mobile Crisis Teams Organization  Address  Phone  Notes  [facility], Mobile Crisis Care Unit  [phone]   [facility]otherapeutic [facility]  [address] [place], [place]   [facility] [address] [place] [phone]    Self-Help/Support Groups Organization         Address  Phone             Notes  [place]. of [place] - variety of support groups  [place] Call for more information  Narcotics Anonymous (NA), [facility] [address], [employer] [place]  2 meetings at this location   Special educational needs teacher         Address  Phone  Notes  ASAP Residential Treatment [place],    [place]  [phone]   [facility]  [address] [place] [address], [place], [place]   [facility] [place], [place] [phone] Admissions: 8am-3pm M-F  Incentives Substance [place] [address]    [place], [place] [phone]   [facility] [address] [place], [place], [place]   [facility] [address]  [place], [place]   Insight Programs - Intensive Outpatient [address], [place], [place]   [facility] ([place].,  [place], [place] [phone] or [phone]   [facility] Services (RTS) [address] [place], [place] Accepts Medicaid  [facility] [address]  [place]  [phone] Substance Abuse/Addiction Treatment   [facility] Organization         Address  Phone  Notes  [facility]  [phone]   Domenic Schwab, PhD [address] Arlis Porta [place], [place]   [phone] or [phone]   Sombrillo Hubbard [place], [place] [phone]   [facility] [address] [place], [place] [phone] Insurance/Medicaid/sponsorship through [facility] and Families [address]                                    [place], [place] [phone] [place] [address], [place] [phone]    Dr. Adele Schilder  [phone]   [facility] Dept. 1) [address], [place] 2) [place] 3)  [place] [address], [place] [phone]  [phone]   [place] [phone] or [phone] (After Hours)

## 2015-06-12 NOTE — ED Notes (Signed)
MD at bedside. 

## 2015-06-12 NOTE — ED Notes (Addendum)
Pt reports his blood sugar has been high - reports feeling dizzy, tired, generalized weakness. [facility] not [facility] with care. CBG 300 last night. Pt reports out of Metformin, Lisinopril, glipizide.

## 2015-06-15 LAB — WOUND CULTURE

## 2015-06-16 ENCOUNTER — Telehealth (HOSPITAL_COMMUNITY): Payer: Self-pay

## 2015-06-16 NOTE — Telephone Encounter (Signed)
Post ED Visit - Positive Culture Follow-up  Culture report reviewed by antimicrobial stewardship pharmacist:  []  Celedonio MiyamotoJeremy Frens, Pharm.D., BCPS [x]  Georgina PillionElizabeth Martin, Pharm.D., BCPS []  Storm LakeMinh Pham, [address] BoulevardPharm.D., BCPS, AAHIVP []  Estella HuskMichelle Turner, Pharm.D., BCPS, AAHIVP []  [place]y Reyes, [address] BoulevardPharm.D. []  Tennis Mustassie Stewart, Pharm.D.  Positive wound culture Treated with clindamycin, organism sensitive to the same and no further patient follow-up is required at this time.  Ashley JacobsFesterman, Densel Kronick C 06/16/2015, 11:55 AM

## 2015-06-30 ENCOUNTER — Emergency Department (HOSPITAL_BASED_OUTPATIENT_CLINIC_OR_DEPARTMENT_OTHER)
Admission: EM | Admit: 2015-06-30 | Discharge: 2015-07-01 | Discharge status: Against Medical Advice | Payer: BLUE CROSS/BLUE SHIELD | Attending: Emergency Medicine | Admitting: Emergency Medicine

## 2015-06-30 ENCOUNTER — Encounter (HOSPITAL_BASED_OUTPATIENT_CLINIC_OR_DEPARTMENT_OTHER): Payer: Self-pay | Admitting: *Deleted

## 2015-06-30 DIAGNOSIS — L02416 Cutaneous abscess of left lower limb: Secondary | ICD-10-CM | POA: Diagnosis not present

## 2015-06-30 DIAGNOSIS — F17200 Nicotine dependence, unspecified, uncomplicated: Secondary | ICD-10-CM | POA: Insufficient documentation

## 2015-06-30 DIAGNOSIS — E119 Type 2 diabetes mellitus without complications: Secondary | ICD-10-CM | POA: Insufficient documentation

## 2015-06-30 DIAGNOSIS — I10 Essential (primary) hypertension: Secondary | ICD-10-CM | POA: Insufficient documentation

## 2015-06-30 NOTE — ED Notes (Signed)
Pt called x1 no answer in the lobby

## 2015-06-30 NOTE — ED Notes (Signed)
Abscess left inner thigh.

## 2015-06-30 NOTE — ED Provider Notes (Signed)
Patient eloped from the emergency department before provider evaluation. I did not see or evaluate this patient. Triage note states abscess to left inner thigh.   Lawrence ClientHannah Daksh Coates, PA-C 06/30/15 [phone]  Leta BaptistEmily Roe Nguyen, MD 07/01/15 [phone]

## 2015-11-21 ENCOUNTER — Encounter (HOSPITAL_BASED_OUTPATIENT_CLINIC_OR_DEPARTMENT_OTHER): Payer: Self-pay | Admitting: *Deleted

## 2015-11-21 ENCOUNTER — Emergency Department (HOSPITAL_BASED_OUTPATIENT_CLINIC_OR_DEPARTMENT_OTHER)
Admission: EM | Admit: 2015-11-21 | Discharge: 2015-11-21 | Disposition: A | Discharge status: Home / Self Care | Payer: BLUE CROSS/BLUE SHIELD | Attending: Emergency Medicine | Admitting: Emergency Medicine

## 2015-11-21 DIAGNOSIS — F17210 Nicotine dependence, cigarettes, uncomplicated: Secondary | ICD-10-CM | POA: Diagnosis not present

## 2015-11-21 DIAGNOSIS — I10 Essential (primary) hypertension: Secondary | ICD-10-CM | POA: Insufficient documentation

## 2015-11-21 DIAGNOSIS — E1165 Type 2 diabetes mellitus with hyperglycemia: Secondary | ICD-10-CM | POA: Diagnosis not present

## 2015-11-21 DIAGNOSIS — Z79899 Other long term (current) drug therapy: Secondary | ICD-10-CM | POA: Insufficient documentation

## 2015-11-21 DIAGNOSIS — Z792 Long term (current) use of antibiotics: Secondary | ICD-10-CM | POA: Diagnosis not present

## 2015-11-21 DIAGNOSIS — Z7984 Long term (current) use of oral hypoglycemic drugs: Secondary | ICD-10-CM | POA: Diagnosis not present

## 2015-11-21 DIAGNOSIS — R739 Hyperglycemia, unspecified: Secondary | ICD-10-CM

## 2015-11-21 LAB — COMPREHENSIVE METABOLIC PANEL
ALK PHOS: 74 U/L (ref 38–126)
ALT: 46 U/L (ref 17–63)
ANION GAP: 11 (ref 5–15)
AST: 22 U/L (ref 15–41)
Albumin: 4.4 g/dL (ref 3.5–5.0)
BILIRUBIN TOTAL: 0.9 mg/dL (ref 0.3–1.2)
BUN: 16 mg/dL (ref 6–20)
CALCIUM: 9.4 mg/dL (ref 8.9–10.3)
CO2: 26 mmol/L (ref 22–32)
Chloride: 95 mmol/L — ABNORMAL LOW (ref 101–111)
Creatinine, Ser: 1.63 mg/dL — ABNORMAL HIGH (ref 0.61–1.24)
GFR calc Af Amer: 58 mL/min — ABNORMAL LOW (ref >60)
GFR, EST NON AFRICAN AMERICAN: 50 mL/min — AB (ref >60)
Glucose, Bld: 415 mg/dL — ABNORMAL HIGH (ref 65–99)
POTASSIUM: 3.9 mmol/L (ref 3.5–5.1)
Sodium: 132 mmol/L — ABNORMAL LOW (ref 135–145)
TOTAL PROTEIN: 8.2 g/dL — AB (ref 6.5–8.1)

## 2015-11-21 LAB — CBC WITH DIFFERENTIAL/PLATELET
Basophils Absolute: 0 10*3/uL (ref 0.0–0.1)
Basophils Relative: 0 %
Eosinophils Absolute: 0.2 10*3/uL (ref 0.0–0.7)
Eosinophils Relative: 2 %
HEMATOCRIT: 47.4 % (ref 39.0–52.0)
HEMOGLOBIN: 16.6 g/dL (ref 13.0–17.0)
LYMPHS ABS: 2.2 10*3/uL (ref 0.7–4.0)
LYMPHS PCT: 26 %
MCH: 26.8 pg (ref 26.0–34.0)
MCHC: 35 g/dL (ref 30.0–36.0)
MCV: 76.6 fL — AB (ref 78.0–100.0)
MONO ABS: 0.7 10*3/uL (ref 0.1–1.0)
MONOS PCT: 8 %
NEUTROS ABS: 5.3 10*3/uL (ref 1.7–7.7)
Neutrophils Relative %: 64 %
Platelets: 278 10*3/uL (ref 150–400)
RBC: 6.19 MIL/uL — ABNORMAL HIGH (ref 4.22–5.81)
RDW: 15 % (ref 11.5–15.5)
WBC: 8.4 10*3/uL (ref 4.0–10.5)

## 2015-11-21 LAB — I-STAT VENOUS BLOOD GAS, ED
ACID-BASE EXCESS: 1 mmol/L (ref 0.0–2.0)
BICARBONATE: 27.6 meq/L — AB (ref 20.0–24.0)
O2 SAT: 57 %
PH VEN: 7.369 — AB (ref 7.250–7.300)
TCO2: 29 mmol/L (ref 0–100)
pCO2, Ven: 48.1 mmHg (ref 45.0–50.0)
pO2, Ven: 31 mmHg (ref 31.0–45.0)

## 2015-11-21 LAB — URINALYSIS, ROUTINE W REFLEX MICROSCOPIC
BILIRUBIN URINE: NEGATIVE
Hgb urine dipstick: NEGATIVE
KETONES UR: 15 mg/dL — AB
Leukocytes, UA: NEGATIVE
Nitrite: NEGATIVE
PH: 6 (ref 5.0–8.0)
Protein, ur: NEGATIVE mg/dL
Specific Gravity, Urine: 1.028 (ref 1.005–1.030)

## 2015-11-21 LAB — CBG MONITORING, ED
Glucose-Capillary: 417 mg/dL — ABNORMAL HIGH (ref 65–99)
Glucose-Capillary: 297 mg/dL — ABNORMAL HIGH (ref 65–99)

## 2015-11-21 LAB — URINE MICROSCOPIC-ADD ON
Bacteria, UA: NONE SEEN
RBC / HPF: NONE SEEN RBC/hpf (ref 0–5)
Squamous Epithelial / LPF: NONE SEEN
WBC, UA: NONE SEEN WBC/hpf (ref 0–5)

## 2015-11-21 MED ORDER — INSULIN REGULAR HUMAN 100 UNIT/ML IJ SOLN
10.0000 [IU] | Freq: Once | INTRAMUSCULAR | Status: AC
  Administered 2015-11-21: 10 [IU] via INTRAVENOUS
  Filled 2015-11-21: qty 1

## 2015-11-21 MED ORDER — SODIUM CHLORIDE 0.9 % IV BOLUS (SEPSIS)
1000.0000 mL | Freq: Once | INTRAVENOUS | Status: AC
  Administered 2015-11-21: 1000 mL via INTRAVENOUS

## 2015-11-21 NOTE — ED Notes (Signed)
CBG >500 at home. Pt c/o fatigue

## 2015-11-21 NOTE — ED Notes (Signed)
Blood Glucose = 417

## 2015-11-21 NOTE — Discharge Instructions (Signed)
INCREASE YOUR METFORMIN TO  (2 pills) in the morning and  (1 pill) at night.  Follow up with your doctor this week.     Hyperglycemia Hyperglycemia occurs when the glucose (sugar) in your blood is too high. Hyperglycemia can happen for many reasons, but it most often happens to people who do not know they have diabetes or are not managing their diabetes properly.  CAUSES  Whether you have diabetes or not, there are other causes of hyperglycemia. Hyperglycemia can occur when you have diabetes, but it can also occur in other situations that you might not be as aware of, such as: Diabetes  If you have diabetes and are having problems controlling your blood glucose, hyperglycemia could occur because of some of the following reasons:  Not following your meal plan.  Not taking your diabetes medications or not taking it properly.  Exercising less or doing less activity than you normally do.  Being sick. Pre-diabetes  This cannot be ignored. Before people develop Type 2 diabetes, they almost always have "pre-diabetes." This is when your blood glucose levels are higher than normal, but not yet high enough to be diagnosed as diabetes. Research has shown that some long-term damage to the body, especially the heart and circulatory system, may already be occurring during pre-diabetes. If you take action to manage your blood glucose when you have pre-diabetes, you may delay or prevent Type 2 diabetes from developing. Stress  If you have diabetes, you may be "diet" controlled or on oral medications or insulin to control your diabetes. However, you may find that your blood glucose is higher than usual in the hospital whether you have diabetes or not. This is often referred to as "stress hyperglycemia." Stress can elevate your blood glucose. This happens because of hormones put out by the body during times of stress. If stress has been the cause of your high blood glucose, it can be followed  regularly by your caregiver. That way he/she can make sure your hyperglycemia does not continue to get worse or progress to diabetes. Steroids  Steroids are medications that act on the infection fighting system (immune system) to block inflammation or infection. One side effect can be a rise in blood glucose. Most people can produce enough extra insulin to allow for this rise, but for those who cannot, steroids make blood glucose levels go even higher. It is not unusual for steroid treatments to "uncover" diabetes that is developing. It is not always possible to determine if the hyperglycemia will go away after the steroids are stopped. A special blood test called an A1c is sometimes done to determine if your blood glucose was elevated before the steroids were started. SYMPTOMS  Thirsty.  Frequent urination.  Dry mouth.  Blurred vision.  Tired or fatigue.  Weakness.  Sleepy.  Tingling in feet or leg. DIAGNOSIS  Diagnosis is made by monitoring blood glucose in one or all of the following ways:  A1c test. This is a chemical found in your blood.  Fingerstick blood glucose monitoring.  Laboratory results. TREATMENT  First, knowing the cause of the hyperglycemia is important before the hyperglycemia can be treated. Treatment may include, but is not be limited to:  Education.  Change or adjustment in medications.  Change or adjustment in meal plan.  Treatment for an illness, infection, etc.  More frequent blood glucose monitoring.  Change in exercise plan.  Decreasing or stopping steroids.  Lifestyle changes. HOME CARE INSTRUCTIONS   Test your blood glucose  as directed.  Exercise regularly. Your caregiver will give you instructions about exercise. Pre-diabetes or diabetes which comes on with stress is helped by exercising.  Eat wholesome, balanced meals. Eat often and at regular, fixed times. Your caregiver or nutritionist will give you a meal plan to guide your sugar  intake.  Being at an ideal weight is important. If needed, losing as little as 10 to 15 pounds may help improve blood glucose levels. SEEK MEDICAL CARE IF:   You have questions about medicine, activity, or diet.  You continue to have symptoms (problems such as increased thirst, urination, or weight gain). SEEK IMMEDIATE MEDICAL CARE IF:   You are vomiting or have diarrhea.  Your breath smells fruity.  You are breathing faster or slower.  You are very sleepy or incoherent.  You have numbness, tingling, or pain in your feet or hands.  You have chest pain.  Your symptoms get worse even though you have been following your caregiver's orders.  If you have any other questions or concerns.   This information is not intended to replace advice given to you by your health care provider. Make sure you discuss any questions you have with your health care provider.   Document Released: 02/01/2001 Document Revised: 10/31/2011 Document Reviewed: 04/14/2015 Elsevier Interactive Patient Education [employer]2016 [employer] Inc.

## 2015-11-21 NOTE — ED Provider Notes (Signed)
CSN: [id number]     Arrival date & time 11/21/15  1441 History  By signing my name below, I, Lawrence Fisher, attest that this documentation has been prepared under the direction and in the presence of [name], MD. Electronically Signed: [name], ED Scribe. 11/21/2015. 3:51 PM.     Chief Complaint  Patient presents with  . Hyperglycemia   The history is provided by the patient. No language interpreter was used.   HPI Comments: Lawrence Fisher is a 45 y.o. male who presents to the Emergency Department complaining of sudden onset of fatigue today PTA. Pt states that his blood sugars have been elevated. Pt reports that for the last 2 weeks has has had urinary frequency and polydipsia. Pt also reports that he would get up several time throughout the night to use the restroom. Pt states that today when he tried to get up he felt very weak. Pt reports Hx of DM and states that he has been compliant with taking his medications. Denies fever, chills, cough, rhinorrhea, vomiting, nausea, diarrhea or abdominal pain.   Past Medical History  Diagnosis Date  . Hypertension   . Diabetes mellitus without complication [facility](HCC)    Past Surgical History  Procedure Laterality Date  . Ankle surgery    . Tonsillectomy     No family history on file. Social History  Substance Use Topics  . Smoking status: Current Every Day Smoker    Types: Cigarettes  . Smokeless tobacco: None  . Alcohol Use: No     Comment: denies current use    Review of Systems  Constitutional: Positive for fatigue. Negative for fever and chills.  HENT: Negative for rhinorrhea and sore throat.   Eyes: Negative for visual disturbance.  Respiratory: Negative for cough and shortness of breath.   Cardiovascular: Negative for chest pain.  Gastrointestinal: Negative for nausea, vomiting, abdominal pain and diarrhea.  Endocrine: Positive for polydipsia.  Genitourinary: Positive for frequency. Negative for difficulty urinating.   Musculoskeletal: Negative for back pain and neck stiffness.  Skin: Negative for rash.  Neurological: Negative for syncope and headaches.  All other systems reviewed and are negative.     Allergies  Review of patient's allergies indicates no known allergies.  Home Medications   Prior to Admission medications   Medication Sig Start Date End Date Taking? Authorizing Provider  buPROPion (WELLBUTRIN XL) 300 MG 24 hr tablet Take 300 mg by mouth daily.   Yes Historical Provider, MD  cyclobenzaprine (FLEXERIL) 10 MG tablet Take 10 mg by mouth 3 (three) times daily as needed for muscle spasms.   Yes Historical Provider, MD  Exenatide (BYDUREON Peter) Inject into the skin.   Yes Historical Provider, MD  gabapentin (NEURONTIN) 300 MG capsule Take 300 mg by mouth 3 (three) times daily.   Yes Historical Provider, MD  ibuprofen (ADVIL,MOTRIN) 200 MG tablet Take 400 mg by mouth every 6 (six) hours as needed for moderate pain.   Yes Historical Provider, MD  lisinopril-hydrochlorothiazide (PRINZIDE,ZESTORETIC) 20-25 MG tablet Take 1 tablet by mouth daily. 06/12/15  Yes Valentino NoseNathan Boswell, MD  metFORMIN (GLUCOPHAGE) 500 MG tablet Take 1 tablet (500 mg total) by mouth 2 (two) times daily with a meal. 06/12/15  Yes [name], MD  Multiple Vitamin (MULTIVITAMIN) capsule Take 1 capsule by mouth daily.   Yes Historical Provider, MD  oxyCODONE-acetaminophen (PERCOCET/ROXICET) 5-325 MG tablet Take by mouth every 4 (four) hours as needed for severe pain.   Yes Historical Provider, MD  ZOLPIDEM TARTRATE  PO Take by mouth.   Yes Historical Provider, MD  clindamycin (CLEOCIN) 300 MG capsule Take 1 capsule (300 mg total) by mouth 3 (three) times daily. 06/12/15   [name], MD  doxycycline (VIBRA-TABS) 100 MG tablet Take 1 tablet (100 mg total) by mouth 2 (two) times daily. 09/18/14   [name], PA-C  glipiZIDE (GLUCOTROL) 10 MG tablet Take 1 tablet (10 mg total) by mouth daily before breakfast. 06/12/15   [name], MD  glipiZIDE (GLUCOTROL) 5 MG tablet Take 1 tablet (5 mg total) by mouth daily before breakfast. 06/12/15   [name], MD  HYDROcodone-acetaminophen (NORCO/VICODIN) 5-325 MG per tablet Take 1 tablet by mouth every 6 (six) hours as needed for moderate pain or severe pain. 09/18/14   [name], PA-C  lisinopril (PRINIVIL,ZESTRIL) 20 MG tablet Take 1 tablet (20 mg total) by mouth daily. 06/12/15   [name], MD  metFORMIN (GLUCOPHAGE) 1000 MG tablet Take 500 mg by mouth 2 (two) times daily with a meal.    Historical Provider, MD   BP 110/70 mmHg  Pulse 80  Temp(Src) 98.3 F (36.8 C) (Oral)  Resp 16  Ht  (1.981 m)  Wt 320 lb (145.151 kg)  BMI 36.99 kg/m2  SpO2 99%   Physical Exam  Constitutional: He is oriented to person, place, and time. He appears well-developed and well-nourished. No distress.  HENT:  Head: Normocephalic and atraumatic.  Eyes: Conjunctivae and EOM are normal. Pupils are equal, round, and reactive to light.  Neck: Normal range of motion. Neck supple. No tracheal deviation present.  Cardiovascular: Normal rate, regular rhythm, normal heart sounds and intact distal pulses.  Exam reveals no gallop and no friction rub.   No murmur heard. Pulmonary/Chest: Effort normal and breath sounds normal. No respiratory distress. He has no wheezes. He has no rales.  Abdominal: Soft. He exhibits no distension. There is no tenderness. There is no guarding.  Musculoskeletal: Normal range of motion. He exhibits no edema.  Neurological: He is alert and oriented to person, place, and time.  Skin: Skin is warm and dry. He is not diaphoretic.  Psychiatric: He has a normal mood and affect. His behavior is normal.  Nursing note and vitals reviewed.   ED Course  Procedures (including critical care time) DIAGNOSTIC STUDIES: Oxygen Saturation is 100% on RA, normal by my interpretation.    COORDINATION OF CARE: 3:48 PM-Discussed treatment plan with pt at bedside and pt  agreed to plan.     Labs Review Labs Reviewed  CBC WITH DIFFERENTIAL/PLATELET - Abnormal; Notable for the following:    RBC 6.19 (*)    MCV 76.6 (*)    All other components within normal limits  COMPREHENSIVE METABOLIC PANEL - Abnormal; Notable for the following:    Sodium 132 (*)    Chloride 95 (*)    Glucose, Bld 415 (*)    Creatinine, Ser 1.63 (*)    Total Protein 8.2 (*)    GFR calc non Af Amer 50 (*)    GFR calc Af Amer 58 (*)    All other components within normal limits  URINALYSIS, ROUTINE W REFLEX MICROSCOPIC (NOT AT [facility]) - Abnormal; Notable for the following:    Glucose, UA >1000 (*)    Ketones, ur 15 (*)    All other components within normal limits  CBG MONITORING, ED - Abnormal; Notable for the following:    Glucose-Capillary 417 (*)    All other components within normal limits  I-STAT VENOUS BLOOD GAS, ED - Abnormal; Notable for the following:    pH, Ven 7.369 (*)    Bicarbonate 27.6 (*)    All other components within normal limits  CBG MONITORING, ED - Abnormal; Notable for the following:    Glucose-Capillary 297 (*)    All other components within normal limits  URINE MICROSCOPIC-ADD ON    Imaging Review No results found.    EKG Interpretation None      MDM   Final diagnoses:  Hyperglycemia    45 year old male with history of hypertension and diabetes presents with concern for polyuria and polydipsia.  Patient with signs of hyperglycemia, however no signs of DKA on laboratory evaluation.  Unclear trigger for worsening hyperglycemia.  Patient reports she's been taking his medications as prescribed. Denies any infectious signs or symptoms. Recommend close PCP follow-up regarding management of diabetes.  He was given IV fluids and insulin with improvement of his glucose from 417 to 297.  Unclear what trigger is for worsening hyperglycemia, and per pt last hgA1C was also elevated--will increase metformin to  in AM and continue  at night and  have patient follow up with PCP this week. Patient discharged in stable condition with understanding of reasons to return. `  I personally performed the services described in this documentation, which was scribed in my presence. The recorded information has been reviewed and is accurate.    Alvira Monday, MD 11/22/15 1119

## 2015-12-29 ENCOUNTER — Ambulatory Visit (INDEPENDENT_AMBULATORY_CARE_PROVIDER_SITE_OTHER): Payer: BLUE CROSS/BLUE SHIELD | Admitting: Endocrinology

## 2015-12-29 ENCOUNTER — Encounter: Payer: BLUE CROSS/BLUE SHIELD | Attending: Endocrinology | Admitting: Nutrition

## 2015-12-29 ENCOUNTER — Encounter: Payer: Self-pay | Admitting: Endocrinology

## 2015-12-29 ENCOUNTER — Other Ambulatory Visit: Payer: Self-pay | Admitting: *Deleted

## 2015-12-29 VITALS — BP 149/98 | HR 90 | Resp 16 | Ht 78.0 in | Wt 298.6 lb

## 2015-12-29 DIAGNOSIS — E1142 Type 2 diabetes mellitus with diabetic polyneuropathy: Secondary | ICD-10-CM | POA: Diagnosis not present

## 2015-12-29 DIAGNOSIS — E1165 Type 2 diabetes mellitus with hyperglycemia: Secondary | ICD-10-CM | POA: Insufficient documentation

## 2015-12-29 DIAGNOSIS — I10 Essential (primary) hypertension: Secondary | ICD-10-CM | POA: Diagnosis not present

## 2015-12-29 DIAGNOSIS — IMO0002 Reserved for concepts with insufficient information to code with codable children: Secondary | ICD-10-CM

## 2015-12-29 DIAGNOSIS — E118 Type 2 diabetes mellitus with unspecified complications: Secondary | ICD-10-CM | POA: Diagnosis not present

## 2015-12-29 DIAGNOSIS — IMO0001 Reserved for inherently not codable concepts without codable children: Secondary | ICD-10-CM

## 2015-12-29 HISTORY — DX: Type 2 diabetes mellitus with hyperglycemia: E11.65

## 2015-12-29 LAB — POCT GLUCOSE (DEVICE FOR HOME USE): Glucose Fasting, POC: 370 mg/dL — AB (ref 70–99)

## 2015-12-29 LAB — POCT GLYCOSYLATED HEMOGLOBIN (HGB A1C): HEMOGLOBIN A1C: HIGH

## 2015-12-29 MED ORDER — GLUCOSE BLOOD VI STRP: ORAL_STRIP | Status: DC

## 2015-12-29 MED ORDER — INSULIN LISPRO PROT & LISPRO (75-25 MIX) 100 UNIT/ML KWIKPEN: PEN_INJECTOR | SUBCUTANEOUS | Status: DC

## 2015-12-29 MED ORDER — BAYER MICROLET LANCETS MISC: Status: AC

## 2015-12-29 MED ORDER — INSULIN PEN NEEDLE 31G X 5 MM MISC: Status: DC

## 2015-12-29 NOTE — Progress Notes (Signed)
Lawrence Fisher and his wife were instructed on how to take the Humalog 75/25 pen.  He was given samples of Nano needles to use, and written instructions were given to take 20u  5-10 min. Before breakfast, and 15u before supper. He does not usually eat lunch, and was strongly encouraged to take 10 min. And eat a sandwich and piece of fruit.  He agreed to do this.  He was encouraged to drink diet drinks, or anything that has 0 calories.   We also reviewed low blood sugars--symptoms and treatment.  He reported good understanding of this.   He was given a bayer meter and shown how to use it.  He was intructed to test per Dr. Remus BlakeKumar's instruction, or at least  Before breakfast and supper.  He agreed to do this.  Goals for readings: less than 140.  They had no final questions.

## 2015-12-29 NOTE — Patient Instructions (Signed)
Check blood sugars on waking up at least every other day  Check blood sugar before lunch and supper by rotation  Also check blood sugars about 2 hours after evening meal at least every other day  Please bring your blood sugar monitor to each visit, thank you  INSULIN: This has to be taken before breakfast and supper, 5-10 minutes before eating preferably  Start taking 20 units before breakfast and 15 units before supper  Cut back on fried foods  GABAPENTIN: Take 1 capsule with each meal and 1-2 capsules at bedtime for pains  Start OTC MAGNESIUM oxide 400 mg or equivalent twice a day with food for cramps.  Please ask pharmacist for the best preparation available

## 2015-12-29 NOTE — Patient Instructions (Signed)
Take insulin-20u before breakfast, and 15u before supper--5-10 min. Before meals. Call if blood sugars drop below 70.

## 2015-12-29 NOTE — Progress Notes (Signed)
Patient ID: Lawrence Fisher, male   DOB: 11/17/1973, 45 y.o.   MRN: [id number]           Reason for Appointment: Consultation for Type 2 Diabetes  Referring physician: None   History of Present Illness:          Date of diagnosis of type 2 diabetes mellitus: ?  2015        Background history:   In 2015 or so he was having symptoms of weakness and increased thirst and was found to have glucose of 500 or so in an emergency room. He was initially treated with metformin 500 mg twice a day only Although his blood sugars improved significantly initially they were higher subsequently and have been persistently high He was taking Bydureon also for probably one year but his last A1c was 11% in 12/16  Recent history:   He is coming here as a self-referral for significantly high blood sugars  Current management, blood sugar patterns and problems identified:  He has been using a family member's monitor to check his blood sugars but not regularly and these have been ranging from about 270 up to 540 more recently  He has been feeling excessively thirsty and he drinks a case of water a day.  He gets up at night about every hour to go to the bathroom  Also at times is getting blurred vision  He has more recently has been losing weight  He has not taken his Bydureon this month because of lack of insurance coverage for this     Non-insulin hypoglycemic drugs the patient is taking are: Metformin 3     Side effects from medications have been:  Compliance with the medical regimen: Fair  Glucose monitoring:  done 0-1  times a day         Glucometer: One Touch.     Readings as above    Self-care: The diet that the patient has been following is: tries to limit sugars, Carb .     Typical meal intake: Breakfast is cereal, eggs, sausage.  Lunch is a sandwich.  Dinner is meat or fried food, vegetables and salad.  For snacks he will have half sandwich, yogurt or granola bar Eating out about 3 times a  week                Dietician visit, most recent: Never               Exercise:  none   Weight history:  Wt Readings from Last 3 Encounters:  12/29/15 298 lb 9.6 oz (135.444 kg)    Glycemic control:    Lab Results  Component Value Date   HGBA1C HIGH 12/29/2015   No results found for: GLUF, MICROALBUR, LDLCALC, CREATININE No results found for: MICRALBCREAT        Medication List       This list is accurate as of: 12/29/15  3:34 PM.  Always use your most recent med list.               BAYER MICROLET LANCETS lancets  Use as instructed to check blood sugar 3 times per day dx code E11.65     glucose blood test strip  Commonly known as:  BAYER CONTOUR NEXT TEST  Use as instructed to check blood sugar 3 times per day Dx code E11.65     Insulin Lispro Prot & Lispro (75-25) 100 UNIT/ML Kwikpen  Commonly known as:  HUMALOG MIX 75/25  KWIKPEN  Inject 20 units in the morning and 15 units in the evening.     Insulin Pen Needle 31G X 5 MM Misc  Use 2 per day to inject insulin     lisinopril 10 MG tablet  Commonly known as:  PRINIVIL,ZESTRIL  Take 10 mg by mouth daily.     metFORMIN 500 MG 24 hr tablet  Commonly known as:  GLUCOPHAGE-XR  Take 500 mg by mouth daily with breakfast. Takes 2 tablets in am and 1 tablet in pm        Allergies: No Known Allergies  No past medical history on file.  No past surgical history on file.  No family history on file.  Social History:  reports that he has been smoking.  He has never used smokeless tobacco. His alcohol and drug histories are not on file.    Review of Systems    Lipid history: Unknown   No results found for: CHOL, HDL, LDLCALC, LDLDIRECT, TRIG, CHOLHDL         Hypertension:Treated for 10-15 years  Most recent eye exam was: None recently   Most recent foot exam: 5/17  Review of Systems  Constitutional: Positive for weight loss and malaise.  HENT: Negative for trouble swallowing.   Eyes: Positive for  blurred vision.  Respiratory: Negative for shortness of breath.   Cardiovascular: Negative for leg swelling and claudication.  Gastrointestinal: Negative for nausea.  Endocrine: Positive for polydipsia and erectile dysfunction.       He has had erectile dysfunction previously, not as much recently and he thinks it was from a previous medication  Musculoskeletal: Positive for muscle cramps.       He has cramps, mostly at night in his legs  Skin: Negative for itching.  Neurological: Positive for weakness, numbness and balance difficulty.       His legs have sharp pains and pins and needles especially lower leg and thighs.  The small 2 toes of his right foot get numb periodically     LABS:  Office Visit on 12/29/2015  Component Date Value Ref Range Status  . Hemoglobin A1C 12/29/2015 HIGH   Final  . Glucose Fasting, POC 12/29/2015 370* 70 - 99 mg/dL Final    Physical Examination:  BP 149/98 mmHg  Pulse 90  Resp 16  Ht  (1.981 m)  Wt 298 lb 9.6 oz (135.444 kg)  BMI 34.51 kg/m2  SpO2 99%  GENERAL:    He is heavyset, mild obesity HEENT:         Eye exam shows normal external appearance. Fundus exam shows no retinopathy.  Oral exam shows normal mucosa without dryness .  NECK:   There is no lymphadenopathy Thyroid is not enlarged and no nodules felt.  Carotids are normal to palpation and no bruit heard LUNGS:         Chest is symmetrical. Lungs are clear to auscultation.Marland Kitchen   HEART:         Heart sounds:  S1 and S2 are normal. No murmur or click heard., no S3 or S4.   ABDOMEN:   There is no distention present. Liver and spleen are not palpable. No other mass or tenderness present.   NEUROLOGICAL:   Ankle and biceps jerks are absent bilaterally.    Diabetic Foot Exam - Simple   Simple Foot Form  Diabetic Foot exam was performed with the following findings:  Yes   Visual Inspection  No deformities, no ulcerations, no other skin  breakdown bilaterally:  Yes  Sensation Testing    See comments:  Yes  Pulse Check  Posterior Tibialis and Dorsalis pulse intact bilaterally:  Yes  Comments  Decreased monofilament sensation in toes distally             Vibration sense is markedly reduced in distal first toes especially right. MUSCULOSKELETAL:  There is no swelling or deformity of the peripheral joints. Spine is normal to inspection.   EXTREMITIES:     There is no edema. No skin lesions present.Marland Kitchen. SKIN:       No rash or lesions of concern.        ASSESSMENT:  Diabetes type 2, uncontrolled    He has had significant hyperglycemia since his diagnosis was made in 2015 and appears to have had persistently poor control He had been managed with Bydureon and metformin until recently and now on metformin alone However his blood sugars from the current records available at been poorly controlled for several months A1c appears to be higher at over 14% compared to 11% about 5 months ago indicating progressive insulin deficiency  He is significantly symptomatic from hyperglycemia and will need to start on insulin  Complications of diabetes: Peripheral neuropathy, symptomatic  Muscle cramps: May be related to hypomagnesemia  Hypertension: Long-standing and blood pressure is relatively high today  PLAN:     Start insulin.  With his reluctance to take multiple injections and for simplicity will start him on premixed insulin twice a day at breakfast and supper.  He will be instructed on insulin injection technique and use of the insulin pen by the nurse educator in detail today.  Discussed timing of insulin injection, injection sites, actions of short and long-acting insulins.  He will start using the Bayer Contour meter to check his blood sugars at least twice a day at various times.  Discussed blood sugar targets  Since he has had significant hyperglycemia and asymptomatic we'll try to get his blood sugars down with progressive titration to at least under 200 in the next 2-3  weeks  Will also check his urine ketones today along with baseline renal function  Advised him to follow a low-fat diet with some protein at each meal  He will continue metformin 1500 mg a day for now  Most likely will need formal consultation with dietitian for meal planning  Empirically start magnesium supplements for muscle cramps  For his neuropathy can start using gabapentin that he has had before but increase the dose to 4-5 capsules a day as needed  Follow-up in 2 weeks  Patient Instructions  Check blood sugars on waking up at least every other day  Check blood sugar before lunch and supper by rotation  Also check blood sugars about 2 hours after evening meal at least every other day  Please bring your blood sugar monitor to each visit, thank you  INSULIN: This has to be taken before breakfast and supper, 5-10 minutes before eating preferably  Start taking 20 units before breakfast and 15 units before supper  Cut back on fried foods  GABAPENTIN: Take 1 capsule with each meal and 1-2 capsules at bedtime for pains  Start OTC MAGNESIUM oxide 400 mg or equivalent twice a day with food for cramps.  Please ask pharmacist for the best preparation available   Counseling time on subjects discussed above is over 50% of today's 60 minute visit  Calaya Gildner 12/29/2015, 3:34 PM   Note: This office note was prepared with Reubin [employer]ragon  voice recognition system technology. Any transcriptional errors that result from this process are unintentional.

## 2015-12-31 ENCOUNTER — Telehealth: Payer: Self-pay | Admitting: Endocrinology

## 2015-12-31 NOTE — Telephone Encounter (Signed)
PT wife is extremely concerned wanting to talk to you or the Doctor ASAP because PT sugar was 480 this morning, she gave him his insulin as directed and now his sugar is 588.  He is not feeling well and she just needs to speak to someone as to know what to do.

## 2015-12-31 NOTE — Telephone Encounter (Signed)
Patients sugar this morning and it was 420, she gave him his insulin, she checked 15 minutes ago and it was 588, she said he had just eaten and drank a soda.  She wants to know what she should do, should she take him to the ED, she's very concerned.

## 2015-12-31 NOTE — Telephone Encounter (Signed)
Noted, patients wife is aware. 

## 2015-12-31 NOTE — Telephone Encounter (Signed)
He needs to stop drinking regular soft drinks Increase INSULIN dose to 30 units twice a day before breakfast and supper

## 2016-01-15 ENCOUNTER — Ambulatory Visit: Payer: BLUE CROSS/BLUE SHIELD | Admitting: Endocrinology

## 2016-01-22 ENCOUNTER — Ambulatory Visit: Payer: BLUE CROSS/BLUE SHIELD

## 2016-01-22 ENCOUNTER — Other Ambulatory Visit: Payer: BLUE CROSS/BLUE SHIELD

## 2016-01-22 ENCOUNTER — Other Ambulatory Visit: Payer: Self-pay | Admitting: *Deleted

## 2016-01-22 DIAGNOSIS — R739 Hyperglycemia, unspecified: Secondary | ICD-10-CM

## 2016-01-22 LAB — COMPREHENSIVE METABOLIC PANEL
ALBUMIN: 4.1 g/dL (ref 3.5–5.2)
ALT: 22 U/L (ref 0–53)
AST: 13 U/L (ref 0–37)
Alkaline Phosphatase: 62 U/L (ref 39–117)
BUN: 15 mg/dL (ref 6–23)
CHLORIDE: 101 meq/L (ref 96–112)
CO2: 30 mEq/L (ref 19–32)
Calcium: 9.7 mg/dL (ref 8.4–10.5)
Creatinine, Ser: 1.07 mg/dL (ref 0.40–1.50)
GFR: 96.59 mL/min (ref >60.00)
Glucose, Bld: 256 mg/dL — ABNORMAL HIGH (ref 70–99)
POTASSIUM: 4.2 meq/L (ref 3.5–5.1)
SODIUM: 138 meq/L (ref 135–145)
Total Bilirubin: 0.4 mg/dL (ref 0.2–1.2)
Total Protein: 7.5 g/dL (ref 6.0–8.3)

## 2016-01-22 LAB — HEMOGLOBIN A1C
HEMOGLOBIN A1C: 13.1 % — AB (ref <5.7)
Mean Plasma Glucose: 329 mg/dL

## 2016-01-25 ENCOUNTER — Ambulatory Visit: Payer: BLUE CROSS/BLUE SHIELD | Admitting: Endocrinology

## 2016-01-25 DIAGNOSIS — Z0289 Encounter for other administrative examinations: Secondary | ICD-10-CM

## 2016-01-25 NOTE — Progress Notes (Signed)
Quick Note:  Please let patient know that blood sugars are poorly controlled, needs to be seen as soon as possible ______

## 2016-02-01 ENCOUNTER — Telehealth: Payer: Self-pay | Admitting: General Practice

## 2016-02-01 ENCOUNTER — Telehealth: Payer: Self-pay | Admitting: Endocrinology

## 2016-02-01 NOTE — Telephone Encounter (Signed)
Patient need a PA for medication Humalog pens.

## 2016-02-01 NOTE — Telephone Encounter (Signed)
Patient's wife said the pharmacist will not fill the patient's Homolog prescription.  They said they need a prior authorization, and he has been out of insulin for two days now.

## 2016-02-02 ENCOUNTER — Other Ambulatory Visit: Payer: Self-pay

## 2016-02-02 ENCOUNTER — Encounter: Payer: Self-pay | Admitting: Endocrinology

## 2016-02-02 ENCOUNTER — Encounter (HOSPITAL_BASED_OUTPATIENT_CLINIC_OR_DEPARTMENT_OTHER): Payer: Self-pay | Admitting: *Deleted

## 2016-02-02 ENCOUNTER — Ambulatory Visit: Payer: BLUE CROSS/BLUE SHIELD | Admitting: Endocrinology

## 2016-02-02 ENCOUNTER — Ambulatory Visit (INDEPENDENT_AMBULATORY_CARE_PROVIDER_SITE_OTHER): Payer: BLUE CROSS/BLUE SHIELD | Admitting: Endocrinology

## 2016-02-02 VITALS — BP 132/88 | HR 95 | Ht 78.0 in | Wt 302.0 lb

## 2016-02-02 DIAGNOSIS — E1165 Type 2 diabetes mellitus with hyperglycemia: Secondary | ICD-10-CM

## 2016-02-02 DIAGNOSIS — Z794 Long term (current) use of insulin: Secondary | ICD-10-CM | POA: Diagnosis not present

## 2016-02-02 DIAGNOSIS — L739 Follicular disorder, unspecified: Secondary | ICD-10-CM

## 2016-02-02 MED ORDER — CANAGLIFLOZIN 100 MG PO TABS: ORAL_TABLET | ORAL | Status: DC

## 2016-02-02 MED ORDER — INSULIN ASPART PROT & ASPART (70-30 MIX) 100 UNIT/ML PEN: PEN_INJECTOR | SUBCUTANEOUS | Status: DC

## 2016-02-02 MED ORDER — CEPHALEXIN 500 MG PO CAPS: 500.0000 mg | ORAL_CAPSULE | Freq: Two times a day (BID) | ORAL | Status: DC

## 2016-02-02 NOTE — Telephone Encounter (Signed)
See note below and please advise. I could not locate the medication in the med list.  

## 2016-02-02 NOTE — Patient Instructions (Signed)
Check blood sugars on waking up 3-4  times a week Also check blood sugars about 2 hours after a meal and do this after different meals by rotation  Recommended blood sugar levels on waking up is 90-130 and about 2 hours after meal is 130-160  Please bring your blood sugar monitor to each visit, thank you  Invokana in am  Insulin 36 at supper and 30 in am

## 2016-02-02 NOTE — Progress Notes (Signed)
Patient ID: Lawrence Fisher, male   DOB: 11/17/1973, 45 y.o.   MRN: [id number]           Reason for Appointment: Follow-up for Type 2 Diabetes  Referring physician: None   History of Present Illness:          Date of diagnosis of type 2 diabetes mellitus: ?  2015        Background history:   In 2015 or so he was having symptoms of weakness and increased thirst and was found to have glucose of 500 or so in an emergency room. He was initially treated with metformin 500 mg twice a day only Although his blood sugars improved significantly initially they were higher subsequently and have been persistently high He was taking Bydureon also for probably one year but his last A1c was 11% in 12/16  Recent history:   INSULIN regimen: Humalog mix, 30 units bid  Current management, blood sugar patterns and problems identified:  He has been using insulin since his initial consultation in 5/17 when his blood sugars were going up to as much as 540  He was started on 30 units of day but he has now been taking 30 units twice a day  He is checking his blood sugars probably mostly in the morning and did not bring any records for review; his lab glucose was 256 recently  Subjectively he is feeling much better with less fatigue and thirst  He has not been following any particular  diet and may sometimes eat high fat meats especially breakfast     Non-insulin hypoglycemic drugs the patient is taking are: Metformin 1000 mg a.m., 500 mg p.m.      Side effects from medications have been: None  Compliance with the medical regimen: Fair  Glucose monitoring:  done 0-1  times a day         Glucometer:  Contour Readings from recall:   Mean values apply above for all meters except median for One Touch  PRE-MEAL Fasting Lunch Dinner Bedtime Overall  Glucose range: 219-300  190-200    Mean/median:           Self-care: The diet that the patient has been following is: tries to limit sugars, Carb .       Typical meal intake: Breakfast is cereal, eggs, sausage.  Lunch is a sandwich.  Dinner is meat or fried food, vegetables and salad.  For snacks he will have half sandwich, yogurt or granola bar Eating out about 3 times a week                Dietician visit, most recent: Never               Exercise:  trying to do a little walking   Weight history:  Wt Readings from Last 3 Encounters:  02/02/16 302 lb (136.986 kg)  12/29/15 298 lb 9.6 oz (135.444 kg)    Glycemic control:    Lab Results  Component Value Date   HGBA1C HIGH 12/29/2015   No results found for: GLUF, MICROALBUR, LDLCALC, CREATININE No results found for: MICRALBCREAT        Medication List       This list is accurate as of: 02/02/16 11:54 AM.  Always use your most recent med list.               BAYER MICROLET LANCETS lancets  Use as instructed to check blood sugar 3 times per day dx code  E11.65     canagliflozin 100 MG Tabs tablet  Commonly known as:  INVOKANA  1 tablet before breakfast     cephALEXin 500 MG capsule  Commonly known as:  KEFLEX  Take 1 capsule (500 mg total) by mouth 2 (two) times daily.     glucose blood test strip  Commonly known as:  BAYER CONTOUR NEXT TEST  Use as instructed to check blood sugar 3 times per day Dx code E11.65     insulin aspart protamine - aspart (70-30) 100 UNIT/ML FlexPen  Commonly known as:  NOVOLOG MIX 70/30 FLEXPEN  Inject 30 units in the morning and 36 units in the evening.     Insulin Pen Needle 31G X 5 MM Misc  Use 2 per day to inject insulin     lisinopril 10 MG tablet  Commonly known as:  PRINIVIL,ZESTRIL  Take 10 mg by mouth daily.     metFORMIN 500 MG 24 hr tablet  Commonly known as:  GLUCOPHAGE-XR  Take 500 mg by mouth daily with breakfast. Takes 2 tablets in am and 1 tablet in pm        Allergies: No Known Allergies  No past medical history on file.  No past surgical history on file.  No family history on file.  Social History:   reports that he has been smoking.  He has never used smokeless tobacco. His alcohol and drug histories are not on file.    Review of Systems    Lipid history: Unknown   No results found for: CHOL, HDL, LDLCALC, LDLDIRECT, TRIG, CHOLHDL         Hypertension:Treated for 10-15 years, now on lisinopril  Most recent eye exam was: None recently   Most recent foot exam: 5/17  Review of Systems  Skin:       He gets boils in his legs and arms, recently having more in the back of his left upper chest, previously this had resolved with antibiotics given by PCP but no records available  Neurological:       Pains in his legs and feet, controlled with gabapentin    LABS:  No visits with results within 1 Week(s) from this visit. Latest known visit with results is:  Office Visit on 12/29/2015  Component Date Value Ref Range Status  . Hemoglobin A1C 12/29/2015 HIGH   Final  . Glucose Fasting, POC 12/29/2015 370* 70 - 99 mg/dL Final    Physical Examination:  BP 132/88 mmHg  Pulse 95  Ht 6\' 6"  (1.981 m)  Wt 302 lb (136.986 kg)  BMI 34.91 kg/m2  SpO2 96%  He has areas of folliculitis on his left upper chest posteriorly without any significant redness or discharge  ASSESSMENT:  Diabetes type 2, uncontrolled    See history of present illness for detailed discussion of current diabetes management, blood sugar patterns and problems identified Currently he has mild improvement in his blood sugars and not a symptomatic when his blood sugars were completely out of control on metformin alone His regimen of premixed insulin 60 units a day is not controlling his blood sugars with readings usually over 200 although not checking after meals as much Still needs significant amount of diabetes education especially meal planning  Hypertension: Long-standing and blood pressure is still high today despite taking lisinopril  PLAN:    Start Invokana 100 mg daily. Discussed action of SGLT 2 drugs on  lowering glucose by decreasing kidney absorption of glucose, benefits of weight loss  and lower blood pressure, possible side effects including candidiasis, potential for dehydration in special situations and discussed morning dosage  Start checking blood sugars more consistently including after meals especially supper  Bring blood sugar monitor for download on each visit  Given information on blood sugar targets at various times  May switch to Novolog Mix since Humalog may not be covered.  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, may try to stop after 5 days  Patient Instructions  Check blood sugars on waking up 3-4  times a week Also check blood sugars about 2 hours after a meal and do this after different meals by rotation  Recommended blood sugar levels on waking up is 90-130 and about 2 hours after meal is 130-160  Please bring your blood sugar monitor to each visit, thank you  Invokana in am  Insulin 36 at supper and 30 in am   Counseling time on subjects discussed above is over 50% of today's 25 minute visit    Omarius Grantham 02/02/2016, 11:54 AM   Note: This office note was prepared with Insurance underwriter. Any transcriptional errors that result from this process are unintentional.

## 2016-02-02 NOTE — Telephone Encounter (Signed)
See note below and please advise. I could not locate the medication in the med list.

## 2016-03-08 ENCOUNTER — Encounter: Payer: Self-pay | Admitting: Endocrinology

## 2016-03-08 ENCOUNTER — Ambulatory Visit (INDEPENDENT_AMBULATORY_CARE_PROVIDER_SITE_OTHER): Payer: BLUE CROSS/BLUE SHIELD | Admitting: Endocrinology

## 2016-03-08 VITALS — BP 126/84 | HR 82 | Ht 78.0 in | Wt 311.0 lb

## 2016-03-08 DIAGNOSIS — E1165 Type 2 diabetes mellitus with hyperglycemia: Secondary | ICD-10-CM

## 2016-03-08 DIAGNOSIS — Z794 Long term (current) use of insulin: Secondary | ICD-10-CM

## 2016-03-08 LAB — BASIC METABOLIC PANEL
BUN: 18 mg/dL (ref 6–23)
CALCIUM: 10.2 mg/dL (ref 8.4–10.5)
CO2: 30 meq/L (ref 19–32)
CREATININE: 1.23 mg/dL (ref 0.40–1.50)
Chloride: 103 mEq/L (ref 96–112)
GFR: 82.19 mL/min (ref >60.00)
GLUCOSE: 110 mg/dL — AB (ref 70–99)
Potassium: 4.5 mEq/L (ref 3.5–5.1)
SODIUM: 140 meq/L (ref 135–145)

## 2016-03-08 LAB — MAGNESIUM: Magnesium: 2.2 mg/dL (ref 1.5–2.5)

## 2016-03-08 LAB — LIPID PANEL
CHOL/HDL RATIO: 3
Cholesterol: 162 mg/dL (ref 0–200)
HDL: 50 mg/dL (ref >39.00)
LDL Cholesterol: 89 mg/dL (ref 0–99)
NONHDL: 111.98
Triglycerides: 115 mg/dL (ref 0.0–149.0)
VLDL: 23 mg/dL (ref 0.0–40.0)

## 2016-03-08 MED ORDER — CANAGLIFLOZIN-METFORMIN HCL ER 150-1000 MG PO TB24: 2.0000 | ORAL_TABLET | Freq: Every day | ORAL | Status: DC

## 2016-03-08 NOTE — Addendum Note (Signed)
Addended by: Reather LittlerKUMAR, Ellis Mehaffey on: 03/08/2016 05:30 PM   Modules accepted: Orders, Medications

## 2016-03-08 NOTE — Patient Instructions (Signed)
Please follow the instructions  Please check with your insurance company about the preferred brand of the glucose monitor and we can send in a prescription for this accordingly  Alternate checking your blood sugar before and after dinner and sometimes check midday also  Change Invokana to Invokamet.  This will be a combination of a higher dose of Invokana with metformin extended release and to be taken 2 tablets at breakfast daily  INSULIN: Continue the same doses for now.  If the blood sugars are below 100 reduce the insulin as follows:  If the blood sugar is below 100 at lunch or dinner time reduce the morning dose by 4 units If the blood sugar is low waking up below 100 may reduce evening dose by 4 units  Limit portions of high-fat foods and carbohydrates  Walk 10 miles a week at least

## 2016-03-08 NOTE — Progress Notes (Signed)
Patient ID: Lawrence Fisher, male   DOB: 1970/10/16, 45 y.o.   MRN: [id number]           Reason for Appointment: Follow-up for Type 2 Diabetes  Referring physician: None   History of Present Illness:          Date of diagnosis of type 2 diabetes mellitus: ?  2015        Background history:   In 2015 or so he was having symptoms of weakness and increased thirst and was found to have glucose of 500 or so in an emergency room. He was initially treated with metformin 500 mg twice a day only Although his blood sugars improved significantly initially they were higher subsequently and have been persistently high He was taking Bydureon also for probably one year but his last A1c was 11% in 12/16  Recent history:   INSULIN regimen: Humalog mix, 20 am,  15 units pm Non-insulin hypoglycemic drugs the patient is taking are Invokana 100 mg daily  Current management, blood sugar patterns and problems identified:  He has been using insulin since his initial consultation in 5/17 when his blood sugars were going up to as much as 540  He was started on Invokana on his last visit because of inadequate control even with 60 units of insulin a day and need for weight loss  He claims that his blood sugars started getting low after starting Invokana but these are not documented.  He is using 2 different glucose monitors and he thinks his blood sugar has been as low as 56 before he started reducing insulin on his own  Previously was taking 30 units twice a day and now has cut back the dose to about 45 units daily  He is not checking blood sugars on the current monitor recently he thinks his blood sugars are near normal both before breakfast and supper  Because of tendency to low sugars he stopped metformin on his own and did not notify us.  Previously on 5900 mg a day       Side effects from medications have been: None  Compliance with the medical regimen: Fair  Glucose monitoring:  done 0-1  times a  day         Glucometer:  Contour Readings from recall:   Mean values apply above for all meters except median for One Touch  PRE-MEAL Fasting Lunch Dinner Bedtime Overall  Glucose range: 112-120  130    Mean/median:           Self-care: The diet that the patient has been following is: tries to limit sugars, Carb .     Typical meal intake: Breakfast is cereal, eggs, sausage.  Lunch is a sandwich.  Dinner is 7 pm,  meat or fried food, vegetables and salad.  For snacks he will have half sandwich, yogurt or granola bar Eating out about 3 times a week                Dietician visit, most recent: Never               Exercise:  trying to do more walking   Weight history:  Wt Readings from Last 3 Encounters:  03/08/16 311 lb (141.069 kg)  02/02/16 302 lb (136.986 kg)  12/29/15 298 lb 9.6 oz (135.444 kg)    Glycemic control:    Lab Results  Component Value Date   HGBA1C 13.1* 01/22/2016   HGBA1C HIGH 12/29/2015   Lab  Results  Component Value Date   LDLCALC 89 03/08/2016   CREATININE 1.23 03/08/2016   No results found for: MICRALBCREAT        Medication List       This list is accurate as of: 03/08/16  4:55 PM.  Always use your most recent med list.               BAYER MICROLET LANCETS lancets  Use as instructed to check blood sugar 3 times per day dx code E11.65     buPROPion 300 MG 24 hr tablet  Commonly known as:  WELLBUTRIN XL  Take 300 mg by mouth daily.     BYDUREON [place]  Inject into the skin. Reported on 03/08/2016     canagliflozin 100 MG Tabs tablet  Commonly known as:  INVOKANA  1 tablet before breakfast     cyclobenzaprine 10 MG tablet  Commonly known as:  FLEXERIL  Take 10 mg by mouth 3 (three) times daily as needed for muscle spasms. Reported on 03/08/2016     gabapentin 300 MG capsule  Commonly known as:  NEURONTIN  Take 300 mg by mouth 3 (three) times daily.     glipiZIDE 5 MG tablet  Commonly known as:  GLUCOTROL  Take 1 tablet (5 mg  total) by mouth daily before breakfast.     glipiZIDE 10 MG tablet  Commonly known as:  GLUCOTROL  Take 1 tablet (10 mg total) by mouth daily before breakfast.     glucose blood test strip  Commonly known as:  BAYER CONTOUR NEXT TEST  Use as instructed to check blood sugar 3 times per day Dx code E11.65     HYDROcodone-acetaminophen 5-325 MG tablet  Commonly known as:  NORCO/VICODIN  Take 1 tablet by mouth every 6 (six) hours as needed for moderate pain or severe pain.     ibuprofen 200 MG tablet  Commonly known as:  ADVIL,MOTRIN  Take 400 mg by mouth every 6 (six) hours as needed for moderate pain.     insulin aspart protamine - aspart (70-30) 100 UNIT/ML FlexPen  Commonly known as:  NOVOLOG MIX 70/30 FLEXPEN  Inject 30 units in the morning and 36 units in the evening.     Insulin Pen Needle 31G X 5 MM Misc  Use 2 per day to inject insulin     lisinopril 10 MG tablet  Commonly known as:  PRINIVIL,ZESTRIL  Take 10 mg by mouth daily. Reported on 03/08/2016     lisinopril 20 MG tablet  Commonly known as:  PRINIVIL,ZESTRIL  Take 1 tablet (20 mg total) by mouth daily.     lisinopril-hydrochlorothiazide 20-25 MG tablet  Commonly known as:  PRINZIDE,ZESTORETIC  Take 1 tablet by mouth daily.     metFORMIN 1000 MG tablet  Commonly known as:  GLUCOPHAGE  Take 500 mg by mouth 2 (two) times daily with a meal. Reported on 03/08/2016     metFORMIN 500 MG 24 hr tablet  Commonly known as:  GLUCOPHAGE-XR  Take 500 mg by mouth daily with breakfast. Takes 2 tablets in am and 1 tablet in pm     metFORMIN 500 MG tablet  Commonly known as:  GLUCOPHAGE  Take 1 tablet (500 mg total) by mouth 2 (two) times daily with a meal.     multivitamin capsule  Take 1 capsule by mouth daily.     oxyCODONE-acetaminophen 5-325 MG tablet  Commonly known as:  PERCOCET/ROXICET  Take by mouth every 4 (  four) hours as needed for severe pain. Reported on 03/08/2016     ZOLPIDEM TARTRATE PO  Take by mouth.          Allergies: No Known Allergies  Past Medical History  Diagnosis Date  . Hypertension   . Diabetes mellitus without complication [facility])     Past Surgical History  Procedure Laterality Date  . Ankle surgery    . Tonsillectomy      No family history on file.  Social History:  reports that he has been smoking Cigarettes.  He has never used smokeless tobacco. He reports that he does not drink alcohol or use illicit drugs.    Review of Systems    Lipid history: Unknown    Lab Results  Component Value Date   CHOL 162 03/08/2016   HDL 50.00 03/08/2016   LDLCALC 89 03/08/2016   TRIG 115.0 03/08/2016   CHOLHDL 3 03/08/2016           Hypertension:Treated for 10-15 years, now on lisinopril  Most recent eye exam was: None recently   Most recent foot exam: 5/17  Review of Systems  Skin:       He gets boils in his legs and arms, recently less with around of Keflex given on his last visit  Neurological:       Pains in his legs and feet, controlled with gabapentin    LABS:  Office Visit on 03/08/2016  Component Date Value Ref Range Status  . Magnesium 03/08/2016 2.2  1.5 - 2.5 mg/dL Final  . Sodium 16/05/9603 140  135 - 145 mEq/L Final  . Potassium 03/08/2016 4.5  3.5 - 5.1 mEq/L Final  . Chloride 03/08/2016 103  96 - 112 mEq/L Final  . CO2 03/08/2016 30  19 - 32 mEq/L Final  . Glucose, Bld 03/08/2016 110* 70 - 99 mg/dL Final  . BUN 54/04/8118 18  6 - 23 mg/dL Final  . Creatinine, Ser 03/08/2016 1.23  0.40 - 1.50 mg/dL Final  . Calcium 14/78/2956 10.2  8.4 - 10.5 mg/dL Final  . GFR 21/30/8657 82.19  >60.00 mL/min Final  . Cholesterol 03/08/2016 162  0 - 200 mg/dL Final   ATP III Classification       Desirable:  < 200 mg/dL               Borderline High:  200 - 239 mg/dL          High:  > = 846 mg/dL  . Triglycerides 03/08/2016 115.0  0.0 - 149.0 mg/dL Final   Normal:  <962 mg/dLBorderline High:  150 - 199 mg/dL  . HDL 03/08/2016 50.00  >39.00 mg/dL Final  .  VLDL 95/28/4132 23.0  0.0 - 40.0 mg/dL Final  . LDL Cholesterol 03/08/2016 89  0 - 99 mg/dL Final  . Total CHOL/HDL Ratio 03/08/2016 3   Final                  Men          Women1/2 Average Risk     3.4          3.3Average Risk          5.0          4.42X Average Risk          9.6          7.13X Average Risk          15.0  11.0                      . NonHDL 03/08/2016 111.98   Final   NOTE:  Non-HDL goal should be 30 mg/dL higher than patient's LDL goal (i.e. LDL goal of < 70 mg/dL, would have non-HDL goal of < 100 mg/dL)    Physical Examination:  BP 126/84 mmHg  Pulse 82  Ht 6\' 6"  (1.981 m)  Wt 311 lb (141.069 kg)  BMI 35.95 kg/m2  SpO2 95%  He has areas of Healed folliculitis on his left upper chest posteriorly without any  redness or discharge  ASSESSMENT:  Diabetes type 2, uncontrolled    See history of present illness for detailed discussion of current diabetes management, blood sugar patterns and problems identified Currently he has marked improvement in his blood sugars with adding Invokana to his insulin regimen He has reduced his insulin by 15 units in the evening and 10 units in the morning He reports relatively good readings but not able to correlate his recall with actual numbers He is not clear what monitor will be covered by his insurance and is not able to use a brand name meter  Also instead of losing weight but Invokana he has gained weight He is going to see the dietitian this week Does need to increase exercise also  Hypertension: Long-standing and blood pressure is slightly better with adding Invokana  PLAN:     Start Invokamet instead of Invokana and metformin separately, will also increase the dose to 300 mg Invokana  Need to check labs today for renal function/potassium  For now he will continue the same doses of insulin  Discussed adjusting morning insulin based on evening blood sugars and evening glucose based on morning blood sugars  He  will check with his insurance about the coverage for preferred monitor  Increase walking  Discussed timing and targets of glucose testing  Follow-up with dietitian, does need to moderate high-fat intake  Discussed need for weight loss  Patient Instructions  Please follow the instructions  Please check with your insurance company about the preferred brand of the glucose monitor and we can send in a prescription for this accordingly  Alternate checking your blood sugar before and after dinner and sometimes check midday also  Change Invokana to Invokamet.  This will be a combination of a higher dose of Invokana with metformin extended release and to be taken 2 tablets at breakfast daily  INSULIN: Continue the same doses for now.  If the blood sugars are below 100 reduce the insulin as follows:  If the blood sugar is below 100 at lunch or dinner time reduce the morning dose by 4 units If the blood sugar is low waking up below 100 may reduce evening dose by 4 units  Limit portions of high-fat foods and carbohydrates  Walk 10 miles a week at least     Counseling time on subjects discussed above is over 50% of today's 25 minute visit    Darenda Fike 03/08/2016, 4:55 PM   Note: This office note was prepared with Dragon voice recognition system technology. Any transcriptional errors that result from this process are unintentional.

## 2016-03-09 LAB — FRUCTOSAMINE: Fructosamine: 287 umol/L — ABNORMAL HIGH (ref 0–285)

## 2016-03-11 ENCOUNTER — Ambulatory Visit: Payer: Self-pay | Admitting: Dietician

## 2016-03-17 ENCOUNTER — Ambulatory Visit: Payer: Self-pay | Admitting: Dietician

## 2016-04-14 ENCOUNTER — Ambulatory Visit: Payer: BLUE CROSS/BLUE SHIELD | Admitting: Endocrinology

## 2016-04-20 ENCOUNTER — Ambulatory Visit (INDEPENDENT_AMBULATORY_CARE_PROVIDER_SITE_OTHER): Payer: BLUE CROSS/BLUE SHIELD | Admitting: Endocrinology

## 2016-04-20 ENCOUNTER — Encounter: Payer: Self-pay | Admitting: Endocrinology

## 2016-04-20 VITALS — BP 114/80 | HR 100 | Ht 78.0 in | Wt 316.0 lb

## 2016-04-20 DIAGNOSIS — E1165 Type 2 diabetes mellitus with hyperglycemia: Secondary | ICD-10-CM

## 2016-04-20 DIAGNOSIS — Z794 Long term (current) use of insulin: Secondary | ICD-10-CM

## 2016-04-20 MED ORDER — EXENATIDE ER 2 MG ~~LOC~~ PEN: PEN_INJECTOR | SUBCUTANEOUS | 2 refills | Status: DC

## 2016-04-20 MED ORDER — CANAGLIFLOZIN 300 MG PO TABS: 300.0000 mg | ORAL_TABLET | Freq: Every day | ORAL | 3 refills | Status: DC

## 2016-04-20 MED ORDER — METFORMIN HCL ER 500 MG PO TB24: 1000.0000 mg | ORAL_TABLET | Freq: Every day | ORAL | 3 refills | Status: DC

## 2016-04-20 NOTE — Patient Instructions (Addendum)
Check blood sugars on waking up  3x per week  Also check blood sugars about 2 hours after a meal and do this after different meals by rotation  Recommended blood sugar levels on waking up is 90-130 and about 2 hours after meal is 130-160  Please bring your blood sugar monitor to each visit, thank you  Stop Invokamet and glipizide  Start Bydureon again once a week  After 1 week of the Bydureon reduce the morning insulin to 15 units  Start Invokana 1 tablet up 300 mg in the morning and metformin ER 500 mg, 2 tablets at dinnertime daily  Avoid high-fat foods, fried foods and a lot of carbohydrates and sugar

## 2016-04-20 NOTE — Progress Notes (Signed)
Patient ID: Lawrence Fisher, male   DOB: 08-15-1971, 45 y.o.   MRN: [id number]           Reason for Appointment: Follow-up for Type 2 Diabetes  Referring physician: None   History of Present Illness:          Date of diagnosis of type 2 diabetes mellitus: ?  2015        Background history:   In 2015 or so he was having symptoms of weakness and increased thirst and was found to have glucose of 500 or so in an emergency room. He was initially treated with metformin 500 mg twice a day only Although his blood sugars improved significantly initially they were higher subsequently and have been persistently high He was taking Bydureon also for probably one year but his last A1c was 11% in 12/16  Recent history:   INSULIN regimen: Humalog mix, 20 am,  15 units pm  Non-insulin hypoglycemic drugs the patient is taking are Invokamet 150/1000, 2 tablets daily   Current management, blood sugar patterns and problems identified:  He has been using insulin since his initial consultation in 5/17 when his blood sugars were going up to as much as 540  He was started on Invokana and this was changed to Invokamet maximum doses on the last visit  Again he did not bring his blood sugar monitor for download  He has gained some more weight  He claims that his blood sugars may sometimes be in the 80s and 90s and he will feel weak and tired at that time and this happens randomly about 2 or 3 times a week  He is checking readings mostly in the mornings and before supper  He thinks his fasting readings are occasionally high but he is usually eating a meal late at night.  Also does not check readings after supper  He is starting to get diarrhea with using high dose metformin  Still has not seen a dietitian as requested, not always cutting back on high-fat foods       Side effects from medications have been: None  Compliance with the medical regimen: Fair  Glucose monitoring:  done 0-1  times a day          Glucometer:  Contour Readings from recall:   Mean values apply above for all meters except median for One Touch  PRE-MEAL Fasting Lunch Dinner Bedtime Overall  Glucose range: 110-174 90 140    Mean/median:           Self-care: The diet that the patient has been following is: tries to limit sugars, Carb .     Typical meal intake: Breakfast is cereal, eggs, sausage.  Lunch is a sandwich.  Dinner is 7 pm,  meat or fried food, vegetables and salad.  For snacks he will have half sandwich, yogurt or granola bar Eating out about 3 times a week                Dietician visit, most recent: Never               Exercise: Has been walking   Weight history:  Wt Readings from Last 3 Encounters:  04/20/16 (!) 316 lb (143.3 kg)  03/08/16 (!) 311 lb (141.1 kg)  02/02/16 (!) 302 lb (137 kg)    Glycemic control:    Lab Results  Component Value Date   HGBA1C 13.1 (H) 01/22/2016   HGBA1C HIGH 12/29/2015   Lab Results  Component Value Date   LDLCALC 89 03/08/2016   CREATININE 1.23 03/08/2016   No results found for: [facility]        Medication List       Accurate as of 04/20/16  5:07 PM. Always use your most recent med list.          BAYER MICROLET LANCETS lancets Use as instructed to check blood sugar 3 times per day dx code E11.65   buPROPion 300 MG 24 hr tablet Commonly known as:  WELLBUTRIN XL Take 300 mg by mouth daily.   canagliflozin 300 MG Tabs tablet Commonly known as:  INVOKANA Take 1 tablet (300 mg total) by mouth daily before breakfast.   cyclobenzaprine 10 MG tablet Commonly known as:  FLEXERIL Take 10 mg by mouth 3 (three) times daily as needed for muscle spasms. Reported on 03/08/2016   Exenatide ER 2 MG Pen Commonly known as:  BYDUREON Inject weekly   gabapentin 300 MG capsule Commonly known as:  NEURONTIN Take 300 mg by mouth 3 (three) times daily.   glucose blood test strip Commonly known as:  BAYER CONTOUR NEXT TEST Use as instructed  to check blood sugar 3 times per day Dx code E11.65   HYDROcodone-acetaminophen 5-325 MG tablet Commonly known as:  NORCO/VICODIN Take 1 tablet by mouth every 6 (six) hours as needed for moderate pain or severe pain.   ibuprofen 200 MG tablet Commonly known as:  ADVIL,MOTRIN Take 400 mg by mouth every 6 (six) hours as needed for moderate pain.   insulin aspart protamine - aspart (70-30) 100 UNIT/ML FlexPen Commonly known as:  NOVOLOG MIX 70/30 FLEXPEN Inject 30 units in the morning and 36 units in the evening.   Insulin Pen Needle 31G X 5 MM Misc Use 2 per day to inject insulin   lisinopril 10 MG tablet Commonly known as:  PRINIVIL,ZESTRIL Take 10 mg by mouth daily. Reported on 03/08/2016   lisinopril 20 MG tablet Commonly known as:  PRINIVIL,ZESTRIL Take 1 tablet (20 mg total) by mouth daily.   lisinopril-hydrochlorothiazide 20-25 MG tablet Commonly known as:  PRINZIDE,ZESTORETIC Take 1 tablet by mouth daily.   metFORMIN 500 MG 24 hr tablet Commonly known as:  GLUCOPHAGE-XR Take 2 tablets (1,000 mg total) by mouth daily with supper.   multivitamin capsule Take 1 capsule by mouth daily.   oxyCODONE-acetaminophen 5-325 MG tablet Commonly known as:  PERCOCET/ROXICET Take by mouth every 4 (four) hours as needed for severe pain. Reported on 03/08/2016   ZOLPIDEM TARTRATE PO Take by mouth.       Allergies: No Known Allergies  Past Medical History:  Diagnosis Date  . Diabetes mellitus without complication (HCC)   . Hypertension     Past Surgical History:  Procedure Laterality Date  . ANKLE SURGERY    . TONSILLECTOMY      No family history on file.  Social History:  reports that he has been smoking Cigarettes.  He has never used smokeless tobacco. He reports that he does not drink alcohol or use drugs.    Review of Systems    Lipid history: Not on treatment    Lab Results  Component Value Date   CHOL 162 03/08/2016   HDL 50.00 03/08/2016   LDLCALC 89  03/08/2016   TRIG 115.0 03/08/2016   CHOLHDL 3 03/08/2016           Hypertension:Treated for 10-15 years,  on lisinopril  Most recent eye exam was: None recently   Most  recent foot exam: 5/17  Review of Systems    He still thinks he is getting problems with the folliculitis Taking gabapentin with relief of neuropathic pain   LABS:  No visits with results within 1 Week(s) from this visit.  Latest known visit with results is:  Office Visit on 03/08/2016  Component Date Value Ref Range Status  . Magnesium 03/08/2016 2.2  1.5 - 2.5 mg/dL Final  . Sodium 16/05/9603 140  135 - 145 mEq/L Final  . Potassium 03/08/2016 4.5  3.5 - 5.1 mEq/L Final  . Chloride 03/08/2016 103  96 - 112 mEq/L Final  . CO2 03/08/2016 30  19 - 32 mEq/L Final  . Glucose, Bld 03/08/2016 110* 70 - 99 mg/dL Final  . BUN 54/04/8118 18  6 - 23 mg/dL Final  . Creatinine, Ser 03/08/2016 1.23  0.40 - 1.50 mg/dL Final  . Calcium 14/78/2956 10.2  8.4 - 10.5 mg/dL Final  . GFR 21/30/8657 82.19  >60.00 mL/min Final  . Fructosamine 03/09/2016 287* 0 - 285 umol/L Final   Comment: Published reference interval for apparently healthy subjects between age 30 and 44 is 48 - 285 umol/L and in a poorly controlled diabetic population is 228 - 563 umol/L with a mean of 396 umol/L.   Marland Kitchen Cholesterol 03/08/2016 162  0 - 200 mg/dL Final  . Triglycerides 03/08/2016 115.0  0.0 - 149.0 mg/dL Final  . HDL 84/69/6295 50.00  >39.00 mg/dL Final  . VLDL 28/41/3244 23.0  0.0 - 40.0 mg/dL Final  . LDL Cholesterol 03/08/2016 89  0 - 99 mg/dL Final  . Total CHOL/HDL Ratio 03/08/2016 3   Final  . NonHDL 03/08/2016 111.98   Final    Physical Examination:  BP 114/80   Pulse 100   Ht 6\' 6"  (1.981 m)   Wt (!) 316 lb (143.3 kg)   BMI 36.52 kg/m      ASSESSMENT:  Diabetes type 2, uncontrolled    See history of present illness for detailed discussion of current diabetes management, blood sugar patterns and problems  identified Currently His blood sugars are appearing to be improved However without his glucose monitor not clear what his blood sugar patterns are Also not checking readings after his evening meal Higher readings fasting may be related to high fat meals He does need to see the dietitian and has not still done this yet  Despite using 300 mg Invokana he is gaining weight even with taking smaller doses of insulin He did previously take Bydureon although this was not effective without insulin, he knows how to do this and for simplicity will try this again  Hypertension: Long-standing and blood pressure is controlled  PLAN:     Start metformin ER 1000 mg and Invokana 300 mg separately  Need to check blood sugars more consistently including after meals and bring monitor for download on each visit  Trial of Bydureon again.  This may enable him to lose weight and possibly reduce or eliminate insulin  Scheduled consultation with dietitian  Increase walking  Check urine microalbumin, no reports available currently  He can try to reduce his morning insulin by 5 units after 1 week of taking Bydureon, discussed that this may take up to 6 weeks to produce maximum effect.  Co-pay card given for Bydureon  Discussed need for weight loss  Patient Instructions  Check blood sugars on waking up  3x per week  Also check blood sugars about 2 hours after a meal and do  this after different meals by rotation  Recommended blood sugar levels on waking up is 90-130 and about 2 hours after meal is 130-160  Please bring your blood sugar monitor to each visit, thank you  Stop Invokamet and glipizide  Start Bydureon again once a week  After 1 week of the Bydureon reduce the morning insulin to 15 units  Start Invokana 1 tablet up 300 mg in the morning and metformin ER 500 mg, 2 tablets at dinnertime daily  Avoid high-fat foods, fried foods and a lot of carbohydrates and sugar       Counseling time  on subjects discussed above is over 50% of today's 25 minute visit    Zyliah Schier 04/20/2016, 5:07 PM   Note: This office note was prepared with Insurance underwriter. Any transcriptional errors that result from this process are unintentional.

## 2016-04-21 LAB — URINALYSIS, ROUTINE W REFLEX MICROSCOPIC
BILIRUBIN URINE: NEGATIVE
Ketones, ur: NEGATIVE
LEUKOCYTES UA: NEGATIVE
NITRITE: NEGATIVE
Specific Gravity, Urine: 1.02 (ref 1.000–1.030)
Total Protein, Urine: NEGATIVE
Urine Glucose: >=1000 — AB
Urobilinogen, UA: 0.2 (ref 0.0–1.0)
pH: 5 (ref 5.0–8.0)

## 2016-04-21 LAB — MICROALBUMIN / CREATININE URINE RATIO
Creatinine,U: 88.1 mg/dL
Microalb Creat Ratio: 5.7 mg/g (ref 0.0–30.0)
Microalb, Ur: 5 mg/dL — ABNORMAL HIGH (ref 0.0–1.9)

## 2016-04-22 ENCOUNTER — Encounter: Payer: BLUE CROSS/BLUE SHIELD | Admitting: Dietician

## 2016-04-22 ENCOUNTER — Ambulatory Visit: Payer: BLUE CROSS/BLUE SHIELD | Admitting: Dietician

## 2016-05-19 ENCOUNTER — Emergency Department (HOSPITAL_BASED_OUTPATIENT_CLINIC_OR_DEPARTMENT_OTHER)
Admission: EM | Admit: 2016-05-19 | Discharge: 2016-05-19 | Disposition: A | Discharge status: Home / Self Care | Payer: BLUE CROSS/BLUE SHIELD | Attending: Dermatology | Admitting: Dermatology

## 2016-05-19 ENCOUNTER — Encounter (HOSPITAL_BASED_OUTPATIENT_CLINIC_OR_DEPARTMENT_OTHER): Payer: Self-pay

## 2016-05-19 DIAGNOSIS — Z5321 Procedure and treatment not carried out due to patient leaving prior to being seen by health care provider: Secondary | ICD-10-CM | POA: Insufficient documentation

## 2016-05-19 DIAGNOSIS — L02411 Cutaneous abscess of right axilla: Secondary | ICD-10-CM | POA: Diagnosis not present

## 2016-05-19 DIAGNOSIS — I10 Essential (primary) hypertension: Secondary | ICD-10-CM | POA: Diagnosis not present

## 2016-05-19 DIAGNOSIS — E119 Type 2 diabetes mellitus without complications: Secondary | ICD-10-CM | POA: Diagnosis not present

## 2016-05-19 DIAGNOSIS — Z7984 Long term (current) use of oral hypoglycemic drugs: Secondary | ICD-10-CM | POA: Insufficient documentation

## 2016-05-19 DIAGNOSIS — Z794 Long term (current) use of insulin: Secondary | ICD-10-CM | POA: Diagnosis not present

## 2016-05-19 DIAGNOSIS — F17210 Nicotine dependence, cigarettes, uncomplicated: Secondary | ICD-10-CM | POA: Diagnosis not present

## 2016-05-19 NOTE — ED Triage Notes (Signed)
Abscess to right axilla x 3 days-NAD-steady gait

## 2016-05-19 NOTE — ED Triage Notes (Signed)
Before triage complete, pt asked/advised of wait to be seen-states he is leaving and will follow up with PCP in the am-NAD

## 2016-05-20 ENCOUNTER — Emergency Department (HOSPITAL_BASED_OUTPATIENT_CLINIC_OR_DEPARTMENT_OTHER): Payer: BLUE CROSS/BLUE SHIELD

## 2016-05-20 ENCOUNTER — Emergency Department (HOSPITAL_BASED_OUTPATIENT_CLINIC_OR_DEPARTMENT_OTHER)
Admission: EM | Admit: 2016-05-20 | Discharge: 2016-05-20 | Disposition: A | Discharge status: Home / Self Care | Payer: BLUE CROSS/BLUE SHIELD | Attending: Emergency Medicine | Admitting: Emergency Medicine

## 2016-05-20 ENCOUNTER — Encounter (HOSPITAL_BASED_OUTPATIENT_CLINIC_OR_DEPARTMENT_OTHER): Payer: Self-pay | Admitting: *Deleted

## 2016-05-20 DIAGNOSIS — Y929 Unspecified place or not applicable: Secondary | ICD-10-CM | POA: Diagnosis not present

## 2016-05-20 DIAGNOSIS — Z79899 Other long term (current) drug therapy: Secondary | ICD-10-CM | POA: Diagnosis not present

## 2016-05-20 DIAGNOSIS — Z7984 Long term (current) use of oral hypoglycemic drugs: Secondary | ICD-10-CM | POA: Insufficient documentation

## 2016-05-20 DIAGNOSIS — S62314A Displaced fracture of base of fourth metacarpal bone, right hand, initial encounter for closed fracture: Secondary | ICD-10-CM | POA: Insufficient documentation

## 2016-05-20 DIAGNOSIS — Y939 Activity, unspecified: Secondary | ICD-10-CM | POA: Insufficient documentation

## 2016-05-20 DIAGNOSIS — Y999 Unspecified external cause status: Secondary | ICD-10-CM | POA: Insufficient documentation

## 2016-05-20 DIAGNOSIS — F17210 Nicotine dependence, cigarettes, uncomplicated: Secondary | ICD-10-CM | POA: Diagnosis not present

## 2016-05-20 DIAGNOSIS — S6991XA Unspecified injury of right wrist, hand and finger(s), initial encounter: Secondary | ICD-10-CM | POA: Diagnosis present

## 2016-05-20 DIAGNOSIS — L02411 Cutaneous abscess of right axilla: Secondary | ICD-10-CM | POA: Insufficient documentation

## 2016-05-20 DIAGNOSIS — Z794 Long term (current) use of insulin: Secondary | ICD-10-CM | POA: Insufficient documentation

## 2016-05-20 DIAGNOSIS — E119 Type 2 diabetes mellitus without complications: Secondary | ICD-10-CM | POA: Insufficient documentation

## 2016-05-20 DIAGNOSIS — I10 Essential (primary) hypertension: Secondary | ICD-10-CM | POA: Insufficient documentation

## 2016-05-20 DIAGNOSIS — S6291XA Unspecified fracture of right wrist and hand, initial encounter for closed fracture: Secondary | ICD-10-CM

## 2016-05-20 MED ORDER — OXYCODONE-ACETAMINOPHEN 5-325 MG PO TABS: 1.0000 | ORAL_TABLET | ORAL | 0 refills | Status: DC | PRN

## 2016-05-20 MED ORDER — OXYCODONE-ACETAMINOPHEN 5-325 MG PO TABS
2.0000 | ORAL_TABLET | Freq: Once | ORAL | Status: AC
  Administered 2016-05-20: 2 via ORAL
  Filled 2016-05-20: qty 2

## 2016-05-20 MED ORDER — LIDOCAINE-EPINEPHRINE (PF) 2 %-1:200000 IJ SOLN
10.0000 mL | Freq: Once | INTRAMUSCULAR | Status: AC
  Administered 2016-05-20: 10 mL
  Filled 2016-05-20: qty 10

## 2016-05-20 MED ORDER — SULFAMETHOXAZOLE-TRIMETHOPRIM 800-160 MG PO TABS: 1.0000 | ORAL_TABLET | Freq: Two times a day (BID) | ORAL | 0 refills | Status: AC

## 2016-05-20 NOTE — ED Triage Notes (Signed)
Pt reports that he fell onto his hand x one hour ago presents with swelling and pain to right hand.

## 2016-05-20 NOTE — ED Notes (Signed)
Pt has large abscess under his right arm pit along with other small abscesses all over his body. Pt states the abscess is tender to the touch.

## 2016-05-20 NOTE — ED Notes (Signed)
Pt stating he wants to leave because "nobody's doing nothing for me." PA made aware and to room to talk to pt now.
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
## 2016-05-20 NOTE — ED Notes (Signed)
MD performed I&D of abscess under right armpit. Wound cleaned and dry dressing applied.

## 2016-05-20 NOTE — Discharge Instructions (Signed)
Medications: Bactrim, Percocet  Treatment: Take Bactrim as prescribed for 7 days. Take 1-2 Percocet every 4-6 hours as needed for your pain. Leave bandage on until tomorrow and wash wound with warm soapy water. Apply clean bandage. Do this daily. Wear your splint at all times and keep clean and dry.   Follow-up: Please follow-up with Dr. Merlyn LotKuzma by calling his office on Monday for further evaluation of your hand fracture. Please follow-up with your primary care provider and 2-3 days for wound check. Please return to emergency department if you develop any worsening signs of infection including fever, increasing pain, redness, swelling, streaking from the wound that was drained today.

## 2016-05-20 NOTE — ED Notes (Signed)
PA at bedside at this time.  

## 2016-05-20 NOTE — ED Notes (Signed)
Patient transported to CT 

## 2016-05-26 ENCOUNTER — Other Ambulatory Visit: Payer: BLUE CROSS/BLUE SHIELD

## 2016-05-27 ENCOUNTER — Other Ambulatory Visit: Payer: Self-pay | Admitting: *Deleted

## 2016-05-27 MED ORDER — LIRAGLUTIDE 18 MG/3ML ~~LOC~~ SOPN: PEN_INJECTOR | SUBCUTANEOUS | 1 refills | Status: DC

## 2016-05-30 ENCOUNTER — Other Ambulatory Visit: Payer: Self-pay | Admitting: Orthopedic Surgery

## 2016-05-31 ENCOUNTER — Ambulatory Visit: Payer: BLUE CROSS/BLUE SHIELD | Admitting: Endocrinology

## 2016-05-31 ENCOUNTER — Encounter (HOSPITAL_BASED_OUTPATIENT_CLINIC_OR_DEPARTMENT_OTHER): Payer: Self-pay | Admitting: *Deleted

## 2016-06-02 ENCOUNTER — Encounter (HOSPITAL_BASED_OUTPATIENT_CLINIC_OR_DEPARTMENT_OTHER): Payer: Self-pay | Admitting: Certified Registered"

## 2016-06-02 ENCOUNTER — Ambulatory Visit (HOSPITAL_BASED_OUTPATIENT_CLINIC_OR_DEPARTMENT_OTHER): Payer: BLUE CROSS/BLUE SHIELD | Admitting: Anesthesiology

## 2016-06-02 ENCOUNTER — Ambulatory Visit (HOSPITAL_BASED_OUTPATIENT_CLINIC_OR_DEPARTMENT_OTHER)
Admission: RE | Admit: 2016-06-02 | Discharge: 2016-06-02 | Disposition: A | Discharge status: Home / Self Care | Payer: BLUE CROSS/BLUE SHIELD | Source: Ambulatory Visit | Attending: Orthopedic Surgery | Admitting: Orthopedic Surgery

## 2016-06-02 ENCOUNTER — Encounter (HOSPITAL_BASED_OUTPATIENT_CLINIC_OR_DEPARTMENT_OTHER)
Admission: RE | Disposition: A | Discharge status: Home / Self Care | Payer: Self-pay | Source: Ambulatory Visit | Attending: Orthopedic Surgery

## 2016-06-02 DIAGNOSIS — Z79899 Other long term (current) drug therapy: Secondary | ICD-10-CM | POA: Diagnosis not present

## 2016-06-02 DIAGNOSIS — I10 Essential (primary) hypertension: Secondary | ICD-10-CM | POA: Insufficient documentation

## 2016-06-02 DIAGNOSIS — S62394A Other fracture of fourth metacarpal bone, right hand, initial encounter for closed fracture: Secondary | ICD-10-CM | POA: Insufficient documentation

## 2016-06-02 DIAGNOSIS — E119 Type 2 diabetes mellitus without complications: Secondary | ICD-10-CM | POA: Diagnosis not present

## 2016-06-02 DIAGNOSIS — S62614A Displaced fracture of proximal phalanx of right ring finger, initial encounter for closed fracture: Secondary | ICD-10-CM | POA: Insufficient documentation

## 2016-06-02 DIAGNOSIS — Z794 Long term (current) use of insulin: Secondary | ICD-10-CM | POA: Insufficient documentation

## 2016-06-02 DIAGNOSIS — Z6836 Body mass index (BMI) 36.0-36.9, adult: Secondary | ICD-10-CM | POA: Insufficient documentation

## 2016-06-02 DIAGNOSIS — F17210 Nicotine dependence, cigarettes, uncomplicated: Secondary | ICD-10-CM | POA: Diagnosis not present

## 2016-06-02 HISTORY — PX: OPEN REDUCTION INTERNAL FIXATION (ORIF) METACARPAL: SHX6234

## 2016-06-02 LAB — POCT I-STAT, CHEM 8
BUN: 14 mg/dL (ref 6–20)
CHLORIDE: 102 mmol/L (ref 101–111)
Calcium, Ion: 1.24 mmol/L (ref 1.15–1.40)
Creatinine, Ser: 0.9 mg/dL (ref 0.61–1.24)
Glucose, Bld: 145 mg/dL — ABNORMAL HIGH (ref 65–99)
HEMATOCRIT: 54 % — AB (ref 39.0–52.0)
Hemoglobin: 18.4 g/dL — ABNORMAL HIGH (ref 13.0–17.0)
POTASSIUM: 4.1 mmol/L (ref 3.5–5.1)
Sodium: 140 mmol/L (ref 135–145)
TCO2: 22 mmol/L (ref 0–100)

## 2016-06-02 LAB — GLUCOSE, CAPILLARY: Glucose-Capillary: 151 mg/dL — ABNORMAL HIGH (ref 65–99)

## 2016-06-02 SURGERY — OPEN REDUCTION INTERNAL FIXATION (ORIF) METACARPAL
Anesthesia: Regional | Site: Hand | Laterality: Right

## 2016-06-02 MED ORDER — LIDOCAINE 2% (20 MG/ML) 5 ML SYRINGE
INTRAMUSCULAR | Status: AC
Start: 2016-06-02 — End: 2016-06-02
  Filled 2016-06-02: qty 5

## 2016-06-02 MED ORDER — OXYCODONE HCL 5 MG/5ML PO SOLN: 5.0000 mg | Freq: Once | ORAL | Status: AC | PRN

## 2016-06-02 MED ORDER — OXYCODONE HCL 5 MG PO TABS
5.0000 mg | ORAL_TABLET | Freq: Once | ORAL | Status: AC | PRN
  Administered 2016-06-02: 5 mg via ORAL

## 2016-06-02 MED ORDER — ONDANSETRON HCL 4 MG/2ML IJ SOLN
INTRAMUSCULAR | Status: DC | PRN
  Administered 2016-06-02: 4 mg via INTRAVENOUS

## 2016-06-02 MED ORDER — MIDAZOLAM HCL 2 MG/2ML IJ SOLN
1.0000 mg | INTRAMUSCULAR | Status: DC | PRN
  Administered 2016-06-02 (×2): 2 mg via INTRAVENOUS

## 2016-06-02 MED ORDER — LACTATED RINGERS IV SOLN
INTRAVENOUS | Status: DC
  Administered 2016-06-02: 11:00:00 via INTRAVENOUS

## 2016-06-02 MED ORDER — OXYCODONE HCL 5 MG PO TABS
ORAL_TABLET | ORAL | Status: AC
  Filled 2016-06-02: qty 1

## 2016-06-02 MED ORDER — DEXAMETHASONE SODIUM PHOSPHATE 10 MG/ML IJ SOLN
INTRAMUSCULAR | Status: AC
  Filled 2016-06-02: qty 1

## 2016-06-02 MED ORDER — MIDAZOLAM HCL 2 MG/2ML IJ SOLN
INTRAMUSCULAR | Status: AC
  Filled 2016-06-02: qty 2

## 2016-06-02 MED ORDER — SCOPOLAMINE 1 MG/3DAYS TD PT72
1.0000 | MEDICATED_PATCH | Freq: Once | TRANSDERMAL | Status: DC | PRN
Start: 2016-06-02 — End: 2016-06-02

## 2016-06-02 MED ORDER — PROPOFOL 500 MG/50ML IV EMUL
INTRAVENOUS | Status: AC
Start: 2016-06-02 — End: 2016-06-02
  Filled 2016-06-02: qty 50

## 2016-06-02 MED ORDER — HYDROMORPHONE HCL 1 MG/ML IJ SOLN
INTRAMUSCULAR | Status: AC
  Filled 2016-06-02: qty 1

## 2016-06-02 MED ORDER — BUPIVACAINE-EPINEPHRINE (PF) 0.5% -1:200000 IJ SOLN
INTRAMUSCULAR | Status: DC | PRN
  Administered 2016-06-02: 30 mL via PERINEURAL

## 2016-06-02 MED ORDER — FENTANYL CITRATE (PF) 100 MCG/2ML IJ SOLN
50.0000 ug | INTRAMUSCULAR | Status: AC | PRN
  Administered 2016-06-02: 50 ug via INTRAVENOUS
  Administered 2016-06-02: 100 ug via INTRAVENOUS
  Administered 2016-06-02: 50 ug via INTRAVENOUS
  Administered 2016-06-02: 100 ug via INTRAVENOUS

## 2016-06-02 MED ORDER — FENTANYL CITRATE (PF) 100 MCG/2ML IJ SOLN
INTRAMUSCULAR | Status: AC
  Filled 2016-06-02: qty 2

## 2016-06-02 MED ORDER — LIDOCAINE 2% (20 MG/ML) 5 ML SYRINGE
INTRAMUSCULAR | Status: DC | PRN
  Administered 2016-06-02: 100 mg via INTRAVENOUS

## 2016-06-02 MED ORDER — CHLORHEXIDINE GLUCONATE 4 % EX LIQD: 60.0000 mL | Freq: Once | CUTANEOUS | Status: DC

## 2016-06-02 MED ORDER — HYDROMORPHONE HCL 1 MG/ML IJ SOLN
0.2500 mg | INTRAMUSCULAR | Status: DC | PRN
  Administered 2016-06-02 (×4): 0.5 mg via INTRAVENOUS

## 2016-06-02 MED ORDER — MEPERIDINE HCL 25 MG/ML IJ SOLN: 6.2500 mg | INTRAMUSCULAR | Status: DC | PRN

## 2016-06-02 MED ORDER — PROPOFOL 10 MG/ML IV BOLUS
INTRAVENOUS | Status: DC | PRN
  Administered 2016-06-02: 300 mg via INTRAVENOUS

## 2016-06-02 MED ORDER — OXYCODONE-ACETAMINOPHEN 5-325 MG PO TABS: ORAL_TABLET | ORAL | 0 refills | Status: DC

## 2016-06-02 MED ORDER — GLYCOPYRROLATE 0.2 MG/ML IJ SOLN: 0.2000 mg | Freq: Once | INTRAMUSCULAR | Status: DC | PRN

## 2016-06-02 MED ORDER — ONDANSETRON HCL 4 MG/2ML IJ SOLN: 4.0000 mg | Freq: Once | INTRAMUSCULAR | Status: DC | PRN

## 2016-06-02 MED ORDER — CEFAZOLIN SODIUM-DEXTROSE 2-4 GM/100ML-% IV SOLN
INTRAVENOUS | Status: AC
  Filled 2016-06-02: qty 100

## 2016-06-02 MED ORDER — ONDANSETRON HCL 4 MG/2ML IJ SOLN
INTRAMUSCULAR | Status: AC
  Filled 2016-06-02: qty 2

## 2016-06-02 MED ORDER — CEFAZOLIN IN D5W 1 GM/50ML IV SOLN
INTRAVENOUS | Status: AC
  Filled 2016-06-02: qty 50

## 2016-06-02 MED ORDER — CEFAZOLIN SODIUM-DEXTROSE 2-4 GM/100ML-% IV SOLN
2.0000 g | INTRAVENOUS | Status: AC
  Administered 2016-06-02: 3 g via INTRAVENOUS

## 2016-06-02 SURGICAL SUPPLY — 47 items
BANDAGE ACE 3X5.8 VEL STRL LF (GAUZE/BANDAGES/DRESSINGS) ×3 IMPLANT
BLADE SURG 15 STRL LF DISP TIS (BLADE) ×2 IMPLANT
BLADE SURG 15 STRL SS (BLADE) ×4
BNDG ESMARK 4X9 LF (GAUZE/BANDAGES/DRESSINGS) ×3 IMPLANT
BNDG GAUZE ELAST 4 BULKY (GAUZE/BANDAGES/DRESSINGS) ×3 IMPLANT
CHLORAPREP W/TINT 26ML (MISCELLANEOUS) ×3 IMPLANT
CORDS BIPOLAR (ELECTRODE) ×3 IMPLANT
COVER BACK TABLE 60X90IN (DRAPES) ×3 IMPLANT
COVER MAYO STAND STRL (DRAPES) ×3 IMPLANT
CUFF TOURNIQUET SINGLE 18IN (TOURNIQUET CUFF) ×3 IMPLANT
DRAPE EXTREMITY T 121X128X90 (DRAPE) ×3 IMPLANT
DRAPE OEC MINIVIEW 54X84 (DRAPES) ×3 IMPLANT
DRAPE SURG 17X23 STRL (DRAPES) ×3 IMPLANT
GAUZE SPONGE 4X4 12PLY STRL (GAUZE/BANDAGES/DRESSINGS) ×3 IMPLANT
GAUZE XEROFORM 1X8 LF (GAUZE/BANDAGES/DRESSINGS) ×3 IMPLANT
GLOVE BIO SURGEON STRL SZ7.5 (GLOVE) ×3 IMPLANT
GLOVE BIOGEL PI IND STRL 8 (GLOVE) ×1 IMPLANT
GLOVE BIOGEL PI INDICATOR 8 (GLOVE) ×2
GOWN STRL REUS W/ TWL LRG LVL3 (GOWN DISPOSABLE) ×1 IMPLANT
GOWN STRL REUS W/TWL LRG LVL3 (GOWN DISPOSABLE) ×2
GOWN STRL REUS W/TWL XL LVL3 (GOWN DISPOSABLE) ×3 IMPLANT
GUIDEWIRE ORTH 6X062XTROC NS (WIRE) ×1 IMPLANT
K-WIRE .035X4 (WIRE) ×6 IMPLANT
K-WIRE .062 (WIRE) ×2
NEEDLE HYPO 25X1 1.5 SAFETY (NEEDLE) IMPLANT
NS IRRIG 1000ML POUR BTL (IV SOLUTION) ×3 IMPLANT
PACK BASIN DAY SURGERY FS (CUSTOM PROCEDURE TRAY) ×3 IMPLANT
PAD CAST 4YDX4 CTTN HI CHSV (CAST SUPPLIES) ×1 IMPLANT
PADDING CAST COTTON 4X4 STRL (CAST SUPPLIES) ×2
SLEEVE SCD COMPRESS KNEE MED (MISCELLANEOUS) IMPLANT
SPLINT PLASTER CAST XFAST 3X15 (CAST SUPPLIES) IMPLANT
SPLINT PLASTER CAST XFAST 4X15 (CAST SUPPLIES) IMPLANT
SPLINT PLASTER XTRA FAST SET 4 (CAST SUPPLIES)
SPLINT PLASTER XTRA FASTSET 3X (CAST SUPPLIES)
STOCKINETTE 4X48 STRL (DRAPES) ×3 IMPLANT
SUT CHROMIC 3 0 PS 2 (SUTURE) ×3 IMPLANT
SUT CHROMIC 4 0 PS 2 18 (SUTURE) ×3 IMPLANT
SUT ETHILON 3 0 PS 1 (SUTURE) IMPLANT
SUT ETHILON 4 0 PS 2 18 (SUTURE) ×3 IMPLANT
SUT MERSILENE 4 0 P 3 (SUTURE) ×3 IMPLANT
SUT VIC AB 3-0 PS1 18 (SUTURE)
SUT VIC AB 3-0 PS1 18XBRD (SUTURE) IMPLANT
SUT VICRYL 4-0 PS2 18IN ABS (SUTURE) ×3 IMPLANT
SYR BULB 3OZ (MISCELLANEOUS) ×3 IMPLANT
SYR CONTROL 10ML LL (SYRINGE) IMPLANT
TOWEL OR 17X24 6PK STRL BLUE (TOWEL DISPOSABLE) ×6 IMPLANT
UNDERPAD 30X30 (UNDERPADS AND DIAPERS) ×3 IMPLANT
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
## 2016-06-02 NOTE — Anesthesia Procedure Notes (Signed)
Procedure Name: LMA Insertion Date/Time: 06/02/2016 11:28 AM Performed by: Curly ShoresRAFT, Mace Weinberg W Pre-anesthesia Checklist: Patient identified, Emergency Drugs available, Suction available and Patient being monitored Patient Re-evaluated:Patient Re-evaluated prior to inductionOxygen Delivery Method: Circle system utilized Preoxygenation: Pre-oxygenation with 100% oxygen Intubation Type: IV induction Ventilation: Mask ventilation without difficulty LMA: LMA inserted LMA Size: 5.0 Number of attempts: 1 Airway Equipment and Method: Bite block Placement Confirmation: positive ETCO2 and breath sounds checked- equal and bilateral Tube secured with: Tape Dental Injury: Teeth and Oropharynx as per pre-operative assessment

## 2016-06-02 NOTE — Anesthesia Procedure Notes (Addendum)
Anesthesia Regional Block:  Infraclavicular brachial plexus block  Pre-Anesthetic Checklist: ,, timeout performed, Correct Patient, Correct Site, Correct Laterality, Correct Procedure, Correct Position, site marked, Risks and benefits discussed,  Surgical consent,  Pre-op evaluation,  At surgeon's request and post-op pain management  Laterality: Right and Upper  Prep: chloraprep       Needles:  Injection technique: Single-shot  Needle Type: Echogenic Stimulator Needle     Needle Length: 5cm 5 cm Needle Gauge: 21 and 21 G    Additional Needles:  Procedures: ultrasound guided (picture in chart) Infraclavicular brachial plexus block Narrative:  Start time: 06/02/2016 11:08 AM End time: 06/02/2016 11:13 AM Injection made incrementally with aspirations every 5 mL.  Performed by: Personally  Anesthesiologist: Talissa Apple      Right infraclavicular block image

## 2016-06-02 NOTE — Progress Notes (Signed)
Assisted Dr. Crews with right, ultrasound guided, infraclavicular block. Side rails up, monitors on throughout procedure. See vital signs in flow sheet. Tolerated Procedure well. 

## 2016-06-02 NOTE — Anesthesia Preprocedure Evaluation (Addendum)
Anesthesia Evaluation  Patient identified by MRN, date of birth, ID band Patient awake    Reviewed: Allergy & Precautions, NPO status , Patient's Chart, lab work & pertinent test results  Airway Mallampati: I  TM Distance: >3 FB Neck ROM: Full    Dental  (+) Teeth Intact, Dental Advisory Given   Pulmonary Current Smoker,    breath sounds clear to auscultation       Cardiovascular hypertension, Pt. on medications  Rhythm:Regular Rate:Normal     Neuro/Psych    GI/Hepatic   Endo/Other  diabetes, Well Controlled, Type 2, Insulin DependentMorbid obesity  Renal/GU      Musculoskeletal   Abdominal   Peds  Hematology   Anesthesia Other Findings   Reproductive/Obstetrics                            Anesthesia Physical Anesthesia Plan  ASA: III  Anesthesia Plan: General   Post-op Pain Management: GA combined w/ Regional for post-op pain   Induction: Intravenous  Airway Management Planned: LMA  Additional Equipment:   Intra-op Plan:   Post-operative Plan: Extubation in OR  Informed Consent: I have reviewed the patients History and Physical, chart, labs and discussed the procedure including the risks, benefits and alternatives for the proposed anesthesia with the patient or authorized representative who has indicated his/her understanding and acceptance.   Dental advisory given  Plan Discussed with: CRNA and Anesthesiologist  Anesthesia Plan Comments:         Anesthesia Quick Evaluation

## 2016-06-02 NOTE — Brief Op Note (Signed)
06/02/2016  12:29 PM  PATIENT:  Lawrence Fisher  45 y.o. male  PRE-OPERATIVE DIAGNOSIS:  right ring phalanx and metacarpal fractures  POST-OPERATIVE DIAGNOSIS:  right ring phalanx and metacarpal fractures  PROCEDURE:  Procedure(s) with comments: OPEN REDUCTION INTERNAL FIXATION (ORIF) right ring phalanx and  METACARPAL fractures (Right) - OPEN REDUCTION INTERNAL FIXATION (ORIF) right ring phalanx and  METACARPAL fractures  SURGEON:  Surgeon(s) and Role:    * Betha LoaKevin Chadwin Fury, MD - Primary  PHYSICIAN ASSISTANT:   ASSISTANTS: Cindee SaltGary Ross Bender, MD   ANESTHESIA:   regional and general  EBL:  Total I/O In: 800 [I.V.:800] Out: 5 [Blood:5]  BLOOD ADMINISTERED:none  DRAINS: none   LOCAL MEDICATIONS USED:  NONE  SPECIMEN:  No Specimen  DISPOSITION OF SPECIMEN:  N/A  COUNTS:  YES  TOURNIQUET:  * Missing tourniquet times found for documented tourniquets in log:  [id number] *  DICTATION: .Other Dictation: Dictation Number no confirmation number  PLAN OF CARE: Discharge to home after PACU  PATIENT DISPOSITION:  PACU - hemodynamically stable.

## 2016-06-02 NOTE — Transfer of Care (Signed)
Immediate Anesthesia Transfer of Care Note  Patient: Lawrence Fisher  Procedure(s) Performed: Procedure(s) with comments: OPEN REDUCTION INTERNAL FIXATION (ORIF) right ring phalanx and  METACARPAL fractures (Right) - OPEN REDUCTION INTERNAL FIXATION (ORIF) right ring phalanx and  METACARPAL fractures  Patient Location: PACU  Anesthesia Type:GA combined with regional for post-op pain  Level of Consciousness: awake, alert  and oriented  Airway & Oxygen Therapy: Patient Spontanous Breathing and Patient connected to face mask oxygen  Post-op Assessment: Report given to RN, Post -op Vital signs reviewed and stable and Patient moving all extremities  Post vital signs: Reviewed and stable  Last Vitals:  Vitals:   06/02/16 1114 06/02/16 1115  BP:    Pulse: 74 78  Resp: 13 12  Temp:      Last Pain:  Vitals:   06/02/16 1016  TempSrc: Oral  PainSc: 8       Patients Stated Pain Goal: 4 (06/02/16 1016)  Complications: No apparent anesthesia complications
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
## 2016-06-02 NOTE — Discharge Instructions (Addendum)

## 2016-06-03 ENCOUNTER — Encounter (HOSPITAL_BASED_OUTPATIENT_CLINIC_OR_DEPARTMENT_OTHER): Payer: Self-pay | Admitting: Orthopedic Surgery

## 2016-06-03 NOTE — Anesthesia Postprocedure Evaluation (Signed)
Anesthesia Post Note  Patient: Lawrence Fisher  Procedure(s) Performed: Procedure(s) (LRB): OPEN REDUCTION INTERNAL FIXATION (ORIF) right ring phalanx and  METACARPAL fractures (Right)  Patient location during evaluation: PACU Anesthesia Type: General Level of consciousness: awake and alert Pain management: pain level controlled Vital Signs Assessment: post-procedure vital signs reviewed and stable Respiratory status: spontaneous breathing, nonlabored ventilation and respiratory function stable Cardiovascular status: blood pressure returned to baseline and stable Postop Assessment: no signs of nausea or vomiting Anesthetic complications: no    Last Vitals:  Vitals:   06/02/16 1345 06/02/16 1445  BP: (!) 163/105 (!) 160/89  Pulse: 74 87  Resp: (!) 21 16  Temp:  36.6 C    Last Pain:  Vitals:   06/03/16 0935  TempSrc:   PainSc: 10-Worst pain ever                 Janmarie Smoot A
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
## 2016-06-06 ENCOUNTER — Ambulatory Visit: Payer: BLUE CROSS/BLUE SHIELD | Admitting: Endocrinology

## 2016-06-06 DIAGNOSIS — Z0289 Encounter for other administrative examinations: Secondary | ICD-10-CM

## 2016-06-09 ENCOUNTER — Encounter: Payer: Self-pay | Admitting: *Deleted

## 2016-06-22 DIAGNOSIS — S62334D Displaced fracture of neck of fourth metacarpal bone, right hand, subsequent encounter for fracture with routine healing: Secondary | ICD-10-CM | POA: Insufficient documentation

## 2016-06-22 DIAGNOSIS — S62614D Displaced fracture of proximal phalanx of right ring finger, subsequent encounter for fracture with routine healing: Secondary | ICD-10-CM | POA: Insufficient documentation

## 2016-06-22 HISTORY — DX: Displaced fracture of neck of fourth metacarpal bone, right hand, subsequent encounter for fracture with routine healing: S62.334D

## 2016-06-22 HISTORY — DX: Displaced fracture of proximal phalanx of right ring finger, subsequent encounter for fracture with routine healing: S62.614D

## 2016-08-01 DIAGNOSIS — F17200 Nicotine dependence, unspecified, uncomplicated: Secondary | ICD-10-CM

## 2016-08-01 DIAGNOSIS — F331 Major depressive disorder, recurrent, moderate: Secondary | ICD-10-CM | POA: Insufficient documentation

## 2016-08-01 DIAGNOSIS — I152 Hypertension secondary to endocrine disorders: Secondary | ICD-10-CM

## 2016-08-01 DIAGNOSIS — K529 Noninfective gastroenteritis and colitis, unspecified: Secondary | ICD-10-CM | POA: Insufficient documentation

## 2016-08-01 DIAGNOSIS — E1159 Type 2 diabetes mellitus with other circulatory complications: Secondary | ICD-10-CM | POA: Insufficient documentation

## 2016-08-01 DIAGNOSIS — R1084 Generalized abdominal pain: Secondary | ICD-10-CM

## 2016-08-01 DIAGNOSIS — R159 Full incontinence of feces: Secondary | ICD-10-CM | POA: Insufficient documentation

## 2016-08-01 HISTORY — DX: Major depressive disorder, recurrent, moderate: F33.1

## 2016-08-01 HISTORY — DX: Generalized abdominal pain: R10.84

## 2016-08-01 HISTORY — DX: Nicotine dependence, unspecified, uncomplicated: F17.200

## 2016-08-01 HISTORY — DX: Full incontinence of feces: R15.9

## 2016-08-01 HISTORY — DX: Noninfective gastroenteritis and colitis, unspecified: K52.9

## 2016-08-01 HISTORY — DX: Hypertension secondary to endocrine disorders: I15.2

## 2016-08-01 HISTORY — DX: Type 2 diabetes mellitus with other circulatory complications: E11.59

## 2016-09-13 ENCOUNTER — Other Ambulatory Visit: Payer: BLUE CROSS/BLUE SHIELD

## 2016-09-15 ENCOUNTER — Other Ambulatory Visit: Payer: BLUE CROSS/BLUE SHIELD

## 2016-09-16 ENCOUNTER — Ambulatory Visit: Payer: BLUE CROSS/BLUE SHIELD | Admitting: Endocrinology

## 2016-09-19 ENCOUNTER — Telehealth: Payer: Self-pay | Admitting: Endocrinology

## 2016-09-19 ENCOUNTER — Encounter: Payer: Self-pay | Admitting: Endocrinology

## 2016-09-19 NOTE — Telephone Encounter (Signed)
He has had 3 no shows, will need to let him know that if he cannot keep his appointments we will need to dismiss him, needs to be seen for diabetes

## 2016-09-19 NOTE — Telephone Encounter (Signed)
Patient no showed today's appt. Please advise on how to follow up. °A. No follow up necessary. °B. Follow up urgent. Contact patient immediately. °C. Follow up necessary. Contact patient and schedule visit in ___ days. °D. Follow up advised. Contact patient and schedule visit in ____weeks. ° °

## 2016-10-21 ENCOUNTER — Other Ambulatory Visit: Payer: BLUE CROSS/BLUE SHIELD

## 2016-10-27 ENCOUNTER — Ambulatory Visit: Payer: BLUE CROSS/BLUE SHIELD | Admitting: Endocrinology

## 2016-10-27 ENCOUNTER — Telehealth: Payer: Self-pay | Admitting: Endocrinology

## 2016-10-27 NOTE — Telephone Encounter (Signed)
Patient no showed today's appt. Please advise on how to follow up. °A. No follow up necessary. °B. Follow up urgent. Contact patient immediately. °C. Follow up necessary. Contact patient and schedule visit in ___ days. °D. Follow up advised. Contact patient and schedule visit in ____weeks. ° °

## 2016-10-27 NOTE — Telephone Encounter (Signed)
This is the third no-show, will need to process dismissal

## 2016-11-02 NOTE — Telephone Encounter (Signed)
Letter and form to be completed please

## 2016-11-08 ENCOUNTER — Other Ambulatory Visit (INDEPENDENT_AMBULATORY_CARE_PROVIDER_SITE_OTHER): Payer: BLUE CROSS/BLUE SHIELD

## 2016-11-08 ENCOUNTER — Other Ambulatory Visit: Payer: BLUE CROSS/BLUE SHIELD

## 2016-11-08 DIAGNOSIS — Z794 Long term (current) use of insulin: Secondary | ICD-10-CM | POA: Diagnosis not present

## 2016-11-08 DIAGNOSIS — E1165 Type 2 diabetes mellitus with hyperglycemia: Secondary | ICD-10-CM

## 2016-11-08 DIAGNOSIS — E1169 Type 2 diabetes mellitus with other specified complication: Principal | ICD-10-CM

## 2016-11-08 DIAGNOSIS — IMO0002 Reserved for concepts with insufficient information to code with codable children: Secondary | ICD-10-CM

## 2016-11-08 LAB — COMPREHENSIVE METABOLIC PANEL
ALBUMIN: 4.5 g/dL (ref 3.5–5.2)
ALT: 28 U/L (ref 0–53)
AST: 15 U/L (ref 0–37)
Alkaline Phosphatase: 69 U/L (ref 39–117)
BUN: 14 mg/dL (ref 6–23)
CHLORIDE: 100 meq/L (ref 96–112)
CO2: 26 meq/L (ref 19–32)
Calcium: 10.2 mg/dL (ref 8.4–10.5)
Creatinine, Ser: 1.14 mg/dL (ref 0.40–1.50)
GFR: 89.45 mL/min (ref >60.00)
Glucose, Bld: 365 mg/dL — ABNORMAL HIGH (ref 70–99)
Potassium: 4.2 mEq/L (ref 3.5–5.1)
SODIUM: 134 meq/L — AB (ref 135–145)
Total Bilirubin: 0.4 mg/dL (ref 0.2–1.2)
Total Protein: 8 g/dL (ref 6.0–8.3)

## 2016-11-09 LAB — HEMOGLOBIN A1C
Hgb A1c MFr Bld: 11.5 % — ABNORMAL HIGH (ref <5.7)
MEAN PLASMA GLUCOSE: 283 mg/dL

## 2016-11-09 NOTE — Telephone Encounter (Signed)
Appt 3/22

## 2016-11-10 ENCOUNTER — Ambulatory Visit (INDEPENDENT_AMBULATORY_CARE_PROVIDER_SITE_OTHER): Payer: BLUE CROSS/BLUE SHIELD | Admitting: Endocrinology

## 2016-11-10 ENCOUNTER — Other Ambulatory Visit: Payer: Self-pay

## 2016-11-10 ENCOUNTER — Encounter: Payer: Self-pay | Admitting: Endocrinology

## 2016-11-10 VITALS — BP 130/86 | HR 104 | Ht 78.0 in | Wt 315.0 lb

## 2016-11-10 DIAGNOSIS — Z794 Long term (current) use of insulin: Secondary | ICD-10-CM

## 2016-11-10 DIAGNOSIS — E1165 Type 2 diabetes mellitus with hyperglycemia: Secondary | ICD-10-CM | POA: Diagnosis not present

## 2016-11-10 DIAGNOSIS — I10 Essential (primary) hypertension: Secondary | ICD-10-CM

## 2016-11-10 MED ORDER — INSULIN PEN NEEDLE 31G X 5 MM MISC: 3 refills | Status: DC

## 2016-11-10 MED ORDER — GLUCOSE BLOOD VI STRP: ORAL_STRIP | 3 refills | Status: DC

## 2016-11-10 MED ORDER — CANAGLIFLOZIN 300 MG PO TABS: 300.0000 mg | ORAL_TABLET | Freq: Every day | ORAL | 3 refills | Status: DC

## 2016-11-10 NOTE — Progress Notes (Signed)
Patient ID: Lawrence MessierJerry P Fisher, male   DOB: [birth year], 46 y.o.   MRN: [id number]           Reason for Appointment: Follow-up for Type 2 Diabetes  Referring physician: None   History of Present Illness:          Date of diagnosis of type 2 diabetes mellitus: ?  2015        Background history:   In 2015 or so Lawrence Fisher was having symptoms of weakness and increased thirst and was found to have glucose of 500 or so in an emergency room. Lawrence Fisher was initially treated with metformin 500 mg twice a day only Although his blood sugars improved significantly initially they were higher subsequently and have been persistently high Lawrence Fisher was taking Bydureon also for probably one year but his last A1c was 11% in 12/16  Recent history:   INSULIN regimen: Humalog mix, 20 am,  15 units pm  Non-insulin hypoglycemic drugs the patient is taking are none  Lawrence Fisher has not been seen in follow-up since 03/2016 His A1c is still significantly high at 11.5   Current management, blood sugar patterns and problems identified:  Lawrence Fisher previously was taking Invokana but Lawrence Fisher ran out of this a few months ago and until recently has been taking metformin  Lawrence Fisher thinks that his blood sugars were much better when Lawrence Fisher was taking Invokana and Lawrence Fisher was told to gradually cut back on insulin also  Lawrence Fisher stopped metformin  also about a month ago because of diarrhea  Lawrence Fisher was asked to try Bydureon on his previous visit because of poor control and Lawrence Fisher had taken this previously but apparently did not get this possibly because of insurance difficulties  Lawrence Fisher was told to try Victoza instead and was told to increase the dose up to 1.2 mg daily; however Lawrence Fisher and his wife did not understand the instructions and Lawrence Fisher is taking only 0.6 mg weekly  Because of his right hand injury has not checked her sugars much except in the last few days and last Sunday with most readings over 300  Lawrence Fisher is not able to do his insulin injection consistently since Lawrence Fisher is not able to use his right  hand so Lawrence Fisher will usually miss the shot at suppertime when his wife is not home  Has not been very active recently  Still has not seen a dietitian as requested, not always cutting back on high-fat foods       Side effects from medications have been: ?  Diarrhea from metformin  Compliance with the medical regimen: Fair  Glucose monitoring:  done 0-1  times a day         Glucometer:  Contour Readings from download show recent average 336, range 187-402 readings mostly the last week in February     Self-care: The diet that the patient has been following is: tries to limit sugars .     Typical meal intake: Breakfast is cereal, eggs, sausage.  Lunch is a sandwich.  Dinner is 7 pm,  meat or fried food, vegetables and salad.  For snacks Lawrence Fisher will have half sandwich, yogurt or granola bar Eating out about 3 times a week                Dietician visit, most recent: Never               Exercise: Has Not been walking   Weight history:  Wt Readings from Last 3 Encounters:  11/10/16 Marland Kitchen(!)  315 lb (142.9 kg)  06/02/16 (!) 315 lb (142.9 kg)  05/20/16 300 lb (136.1 kg)    Glycemic control:    Lab Results  Component Value Date   HGBA1C 11.5 (H) 11/08/2016   HGBA1C 13.1 (H) 01/22/2016   HGBA1C HIGH 12/29/2015   Lab Results  Component Value Date   MICROALBUR 5.0 (H) 04/20/2016   LDLCALC 89 03/08/2016   CREATININE 1.14 11/08/2016   Lab Results  Component Value Date   MICRALBCREAT 5.7 04/20/2016    Lab on 11/08/2016  Component Date Value Ref Range Status  . Hgb A1c MFr Bld 11/08/2016 11.5* <5.7 % Final   Comment:   For someone without known diabetes, a hemoglobin A1c value of 6.5% or greater indicates that they may have diabetes and this should be confirmed with a follow-up test.   For someone with known diabetes, a value <7% indicates that their diabetes is well controlled and a value greater than or equal to 7% indicates suboptimal control. A1c targets should be  individualized based on duration of diabetes, age, comorbid conditions, and other considerations.   Currently, no consensus exists for use of hemoglobin A1c for diagnosis of diabetes for children.     . Mean Plasma Glucose 11/08/2016 283  mg/dL Final  Lab on 40/98/1191  Component Date Value Ref Range Status  . Sodium 11/08/2016 134* 135 - 145 mEq/L Final  . Potassium 11/08/2016 4.2  3.5 - 5.1 mEq/L Final  . Chloride 11/08/2016 100  96 - 112 mEq/L Final  . CO2 11/08/2016 26  19 - 32 mEq/L Final  . Glucose, Bld 11/08/2016 365* 70 - 99 mg/dL Final  . BUN 47/82/9562 14  6 - 23 mg/dL Final  . Creatinine, Ser 11/08/2016 1.14  0.40 - 1.50 mg/dL Final  . Total Bilirubin 11/08/2016 0.4  0.2 - 1.2 mg/dL Final  . Alkaline Phosphatase 11/08/2016 69  39 - 117 U/L Final  . AST 11/08/2016 15  0 - 37 U/L Final  . ALT 11/08/2016 28  0 - 53 U/L Final  . Total Protein 11/08/2016 8.0  6.0 - 8.3 g/dL Final  . Albumin 13/03/6577 4.5  3.5 - 5.2 g/dL Final  . Calcium 46/96/2952 10.2  8.4 - 10.5 mg/dL Final  . GFR 84/13/2440 89.45  >60.00 mL/min Final       Allergies as of 11/10/2016   No Known Allergies     Medication List       Accurate as of 11/10/16  4:10 PM. Always use your most recent med list.          BAYER MICROLET LANCETS lancets Use as instructed to check blood sugar 3 times per day dx code E11.65   buPROPion 300 MG 24 hr tablet Commonly known as:  WELLBUTRIN XL Take 300 mg by mouth daily.   gabapentin 300 MG capsule Commonly known as:  NEURONTIN Take 300 mg by mouth 3 (three) times daily.   glucose blood test strip Commonly known as:  BAYER CONTOUR NEXT TEST Use as instructed to check blood sugar 3 times per day Dx code E11.65   insulin aspart protamine - aspart (70-30) 100 UNIT/ML FlexPen Commonly known as:  NOVOLOG MIX 70/30 FLEXPEN Inject 30 units in the morning and 36 units in the evening.   Insulin Pen Needle 31G X 5 MM Misc Use 3 per day to inject insulin    liraglutide 18 MG/3ML Sopn Commonly known as:  VICTOZA Inject 0.6 mg for the first week, then increase  to 1.2 mg the second week.   lisinopril 10 MG tablet Commonly known as:  PRINIVIL,ZESTRIL Take 10 mg by mouth daily. Reported on 03/08/2016   metFORMIN 500 MG 24 hr tablet Commonly known as:  GLUCOPHAGE-XR Take 2 tablets (1,000 mg total) by mouth daily with supper.   multivitamin capsule Take 1 capsule by mouth daily.   oxyCODONE-acetaminophen 5-325 MG tablet Commonly known as:  PERCOCET/ROXICET Take 1-2 tablets by mouth every 4 (four) hours as needed for severe pain.   oxyCODONE-acetaminophen 5-325 MG tablet Commonly known as:  PERCOCET 1-2 tabs po q6 hours prn pain       Allergies: No Known Allergies  Past Medical History:  Diagnosis Date  . Diabetes mellitus without complication (HCC)   . Hypertension     Past Surgical History:  Procedure Laterality Date  . ANKLE SURGERY    . OPEN REDUCTION INTERNAL FIXATION (ORIF) METACARPAL Right 06/02/2016   Procedure: OPEN REDUCTION INTERNAL FIXATION (ORIF) right ring phalanx and  METACARPAL fractures;  Surgeon: Betha Loa, MD;  Location: [facility];  Service: Orthopedics;  Laterality: Right;  OPEN REDUCTION INTERNAL FIXATION (ORIF) right ring phalanx and  METACARPAL fractures  . TONSILLECTOMY      No family history on file.  Social History:  reports that Lawrence Fisher has been smoking Cigarettes.  Lawrence Fisher has been smoking about 0.50 packs per day. Lawrence Fisher has never used smokeless tobacco. Lawrence Fisher reports that Lawrence Fisher drinks alcohol. Lawrence Fisher reports that Lawrence Fisher uses drugs, including Marijuana.    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 Hgb A1c MFr Bld 11/08/2016 11.5* <5.7 % Final   Comment:   For someone without known diabetes, a hemoglobin A1c value of 6.5% or greater indicates that they may have diabetes and this should be confirmed with a follow-up test.   For someone with known diabetes, a value <7% indicates that their diabetes is well controlled and a value greater than or equal to 7% indicates suboptimal control. A1c targets should be individualized based on duration of diabetes, age, comorbid conditions, and other considerations.   Currently, no consensus exists for use of hemoglobin A1c for diagnosis of diabetes for children.     . Mean Plasma Glucose 11/08/2016 283  mg/dL Final  Lab on 16/05/9603  Component Date Value Ref Range Status  . Sodium 11/08/2016 134* 135 - 145 mEq/L Final  . Potassium 11/08/2016 4.2  3.5 - 5.1 mEq/L Final  . Chloride 11/08/2016 100  96 - 112 mEq/L Final  . CO2 11/08/2016 26  19 - 32 mEq/L Final  . Glucose, Bld 11/08/2016 365* 70 - 99 mg/dL Final  . BUN 54/04/8118 14  6 - 23 mg/dL Final  . Creatinine, Ser 11/08/2016 1.14  0.40 - 1.50 mg/dL Final  . Total Bilirubin 11/08/2016 0.4  0.2 - 1.2 mg/dL Final  . Alkaline Phosphatase 11/08/2016 69  39 - 117 U/L Final  . AST 11/08/2016 15  0 - 37 U/L Final  . ALT 11/08/2016 28  0 - 53 U/L Final  . Total Protein 11/08/2016 8.0  6.0 - 8.3 g/dL Final  . Albumin 11/91/4782 4.5  3.5 - 5.2 g/dL Final  . Calcium 95/62/1308 10.2  8.4 - 10.5 mg/dL Final  . GFR 65/78/4696 89.45  >60.00 mL/min Final    Physical Examination:  BP 130/86   Pulse (!) 104   Ht 6\' 6"  (1.981 m)   Wt (!) 315 lb (142.9 kg)   SpO2 98%   BMI 36.40 kg/m      ASSESSMENT:  Diabetes type 2, uncontrolled    See history of present illness for detailed  discussion of current diabetes management, blood sugar patterns and problems identified  A1c is 11.5 Although previously had done well with Invokana and requiring only low-dose insulin his blood sugars are controlled now Also has not been taking Novolog Mix insulin irregularly Although Lawrence Fisher has had diarrhea since last year not clear if this is from metformin alone since Lawrence Fisher has persistent diarrhea even a month after stopping this Lawrence Fisher has had some limitations of his ability to check his sugar and give insulin because of right hand injury  Hypertension: Blood pressure relatively high today, may have been better previously when taking Invokana also   PLAN:     Start Invokana 300 mg again  Need to check blood sugars  consistently including after meals and bring monitor for download on each visit  Discussed how to use the Victoza pen and the dosage titration  She had taken Bydureon without side effects previously can go up to 1.2 mg now and take it daily morning this daily, his wife can do this  Also discussed with his wife the need for taking insulin twice a day consistently before eating  May be able to taper down his insulin dose and this was discussed  Start walking for exercise  Discussed need for weight loss, consider consultation with dietitian.  Lawrence Fisher needs to follow-up with his gastroenterologist for diarrhea  Patient Instructions  Take Victoza 1.2mg  daily  Check blood sugars on waking up 3x weekly   Also check blood sugars about 2 hours after a meal and do this after different meals by rotation  Recommended blood sugar levels on waking up is 90-130 and about 2 hours after meal is 130-160  Please bring your blood sugar monitor to each visit, thank you  When sugars below 120 reduce insulin to 15 twice daily, now 20 U before meals      Counseling time on subjects discussed above is over 50% of today's 25 minute visit    Aalyah Mansouri 11/10/2016, 4:10 PM   Note: This office  note was prepared with Dragon voice recognition system technology. Any transcriptional errors that result from this process are unintentional.

## 2016-11-10 NOTE — Patient Instructions (Addendum)
Take Victoza 1.2mg  daily  Check blood sugars on waking up 3x weekly   Also check blood sugars about 2 hours after a meal and do this after different meals by rotation  Recommended blood sugar levels on waking up is 90-130 and about 2 hours after meal is 130-160  Please bring your blood sugar monitor to each visit, thank you  When sugars below 120 reduce insulin to 15 twice daily, now 20 U before meals

## 2016-12-08 ENCOUNTER — Ambulatory Visit: Payer: BLUE CROSS/BLUE SHIELD | Admitting: Endocrinology

## 2016-12-08 ENCOUNTER — Other Ambulatory Visit: Payer: Self-pay | Admitting: Endocrinology

## 2016-12-08 DIAGNOSIS — Z794 Long term (current) use of insulin: Principal | ICD-10-CM

## 2016-12-08 DIAGNOSIS — E1165 Type 2 diabetes mellitus with hyperglycemia: Secondary | ICD-10-CM

## 2016-12-12 ENCOUNTER — Other Ambulatory Visit: Payer: BLUE CROSS/BLUE SHIELD

## 2016-12-14 ENCOUNTER — Ambulatory Visit: Payer: BLUE CROSS/BLUE SHIELD | Admitting: Endocrinology

## 2016-12-14 ENCOUNTER — Telehealth: Payer: Self-pay | Admitting: Endocrinology

## 2016-12-14 NOTE — Telephone Encounter (Signed)
Follow-up necessary as well as possible, please inform patient of no-show policy

## 2016-12-14 NOTE — Telephone Encounter (Signed)
Patient no showed today's appt. Please advise on how to follow up. °A. No follow up necessary. °B. Follow up urgent. Contact patient immediately. °C. Follow up necessary. Contact patient and schedule visit in ___ days. °D. Follow up advised. Contact patient and schedule visit in ____weeks. ° °

## 2016-12-15 ENCOUNTER — Encounter: Payer: Self-pay | Admitting: Endocrinology

## 2016-12-15 NOTE — Telephone Encounter (Signed)
Attempted to call the pt to reschedule the appt, no answer and no voicemail  Mailed letter

## 2016-12-29 ENCOUNTER — Other Ambulatory Visit: Payer: Self-pay | Admitting: Endocrinology

## 2017-01-19 ENCOUNTER — Other Ambulatory Visit (INDEPENDENT_AMBULATORY_CARE_PROVIDER_SITE_OTHER): Payer: BLUE CROSS/BLUE SHIELD

## 2017-01-19 DIAGNOSIS — E1165 Type 2 diabetes mellitus with hyperglycemia: Secondary | ICD-10-CM | POA: Diagnosis not present

## 2017-01-19 DIAGNOSIS — Z794 Long term (current) use of insulin: Secondary | ICD-10-CM | POA: Diagnosis not present

## 2017-01-19 LAB — GLUCOSE, RANDOM: Glucose, Bld: 122 mg/dL — ABNORMAL HIGH (ref 70–99)

## 2017-01-20 LAB — FRUCTOSAMINE: Fructosamine: 253 umol/L (ref 0–285)

## 2017-01-23 ENCOUNTER — Ambulatory Visit (INDEPENDENT_AMBULATORY_CARE_PROVIDER_SITE_OTHER): Payer: BLUE CROSS/BLUE SHIELD | Admitting: Endocrinology

## 2017-01-23 ENCOUNTER — Encounter: Payer: Self-pay | Admitting: Endocrinology

## 2017-01-23 VITALS — BP 142/98 | HR 94 | Ht 78.0 in | Wt 320.6 lb

## 2017-01-23 DIAGNOSIS — I10 Essential (primary) hypertension: Secondary | ICD-10-CM | POA: Diagnosis not present

## 2017-01-23 DIAGNOSIS — Z794 Long term (current) use of insulin: Secondary | ICD-10-CM

## 2017-01-23 DIAGNOSIS — K529 Noninfective gastroenteritis and colitis, unspecified: Secondary | ICD-10-CM | POA: Diagnosis not present

## 2017-01-23 DIAGNOSIS — E1165 Type 2 diabetes mellitus with hyperglycemia: Secondary | ICD-10-CM

## 2017-01-23 NOTE — Progress Notes (Signed)
Patient ID: Lawrence Fisher, male   DOB: November 21, 1970, 46 y.o.   MRN: [id number]           Reason for Appointment: Follow-up for Type 2 Diabetes  Referring physician: None   History of Present Illness:          Date of diagnosis of type 2 diabetes mellitus: ?  2015        Background history:   In 2015 or so he was having symptoms of weakness and increased thirst and was found to have glucose of 500 or so in an emergency room. He was initially treated with metformin 500 mg twice a day only Although his blood sugars improved significantly initially they were higher subsequently and have been persistently high He was taking Bydureon also for probably one year but his last A1c was 11% in 12/16  Recent history:   INSULIN regimen: Novolog mix, 20 am,  15 units ac/pc  Non-insulin hypoglycemic drugs the patient is taking are Victoza, Invokana 300 mg daily  His A1c in March was 11.5 However fructosamine now is 253  Current management, blood sugar patterns and problems identified:  He has been started back on Invokana on his last visit in March  However he is checking his blood sugars very sporadically and mostly before meals  Also has gained 5 pounds  He takes his Victoza and insulin in the morning before breakfast although may or may not eat anything in the morning  He says that he gets diarrhea frequently especially in the mornings and he thinks this is 15 minutes after he takes his morning injections; also however he has had diarrhea on and off for 2 years  Also he now says that he is usually eating a meal like a sandwich around 6 PM and then the full meal at bedtime  However he is taking his evening insulin right before or sometimes after eating his evening meal; he says his wife is helping him with his medications and he is not aware of what he is doing much  He has been recommended consultation with dietitian but he does not want to do this       Side effects from medications  have been: ?  Diarrhea from metformin  Compliance with the medical regimen: Fair  Glucose monitoring:  done 0-1  times a day         Glucometer:  Contour  Readings from download show  Mean values apply above for all meters except median for One Touch  PRE-MEAL Fasting Lunch Dinner 6 PM  Overall  Glucose range: 147-201   108-145  171, 181    Mean/median:     151       Self-care: The diet that the patient has been following is: tries to limit sugars .     Typical meal intake: Breakfast is cereal, eggs, sausage.  Lunch may be a sandwich.  Dinner is 7 pm,  meat or fried food, vegetables and salad.  For snacks he will have half sandwich, yogurt or granola bar               Dietician visit, most recent: Never               Exercise: Has been walking   Weight history:  Wt Readings from Last 3 Encounters:  01/23/17 (!) 320 lb 9.6 oz (145.4 kg)  11/10/16 (!) 315 lb (142.9 kg)  06/02/16 (!) 315 lb (142.9 kg)    Glycemic  control:    Lab Results  Component Value Date   HGBA1C 11.5 (H) 11/08/2016   HGBA1C 13.1 (H) 01/22/2016   HGBA1C HIGH 12/29/2015   Lab Results  Component Value Date   MICROALBUR 5.0 (H) 04/20/2016   LDLCALC 89 03/08/2016   CREATININE 1.14 11/08/2016   Lab Results  Component Value Date   MICRALBCREAT 5.7 04/20/2016    Appointment on 01/19/2017  Component Date Value Ref Range Status  . Fructosamine 01/19/2017 253  0 - 285 umol/L Final   Comment: Published reference interval for apparently healthy subjects between age 19 and 46 is 55 - 285 umol/L and in a poorly controlled diabetic population is 228 - 563 umol/L with a mean of 396 umol/L.   Marland Kitchen Glucose, Bld 01/19/2017 122* 70 - 99 mg/dL Final       Allergies as of 01/23/2017   No Known Allergies     Medication List       Accurate as of 01/23/17  8:49 PM. Always use your most recent med list.          BAYER MICROLET LANCETS lancets Use as instructed to check blood sugar 3 times per day dx code  E11.65   canagliflozin 300 MG Tabs tablet Commonly known as:  INVOKANA Take 1 tablet (300 mg total) by mouth daily before breakfast.   gabapentin 300 MG capsule Commonly known as:  NEURONTIN Take 300 mg by mouth 3 (three) times daily.   glucose blood test strip Commonly known as:  BAYER CONTOUR NEXT TEST Use as instructed to check blood sugar 3 times per day Dx code E11.65   insulin aspart protamine - aspart (70-30) 100 UNIT/ML FlexPen Commonly known as:  NOVOLOG MIX 70/30 FLEXPEN Inject 30 units in the morning and 36 units in the evening.   Insulin Pen Needle 31G X 5 MM Misc Use 3 per day to inject insulin   lisinopril 10 MG tablet Commonly known as:  PRINIVIL,ZESTRIL Take 10 mg by mouth daily. Reported on 03/08/2016   metFORMIN 500 MG 24 hr tablet Commonly known as:  GLUCOPHAGE-XR Take 2 tablets (1,000 mg total) by mouth daily with supper.   multivitamin capsule Take 1 capsule by mouth daily.   oxyCODONE-acetaminophen 5-325 MG tablet Commonly known as:  PERCOCET/ROXICET Take 1-2 tablets by mouth every 4 (four) hours as needed for severe pain.   oxyCODONE-acetaminophen 5-325 MG tablet Commonly known as:  PERCOCET 1-2 tabs po q6 hours prn pain   VICTOZA 18 MG/3ML Sopn Generic drug:  liraglutide INJECT 0.6 MILLIGRAMS INTO THE SKIN DAILY FOR THE FIRST WEEK, THEN INCREASE TO 1.2 MILLIGRAMS DAILY THE SECOND WEEK       Allergies: No Known Allergies  Past Medical History:  Diagnosis Date  . Diabetes mellitus without complication (HCC)   . Hypertension     Past Surgical History:  Procedure Laterality Date  . ANKLE SURGERY    . OPEN REDUCTION INTERNAL FIXATION (ORIF) METACARPAL Right 06/02/2016   Procedure: OPEN REDUCTION INTERNAL FIXATION (ORIF) right ring phalanx and  METACARPAL fractures;  Surgeon: Betha Loa, MD;  Location: [facility];  Service: Orthopedics;  Laterality: Right;  OPEN REDUCTION INTERNAL FIXATION (ORIF) right ring phalanx and   METACARPAL fractures  . TONSILLECTOMY      No family history on file.  Social History:  reports that he has been smoking Cigarettes.  He has been smoking about 0.50 packs per day. He has never used smokeless tobacco. He reports that he drinks alcohol.  He reports that he uses drugs, including Marijuana.    Review of Systems    Lipid history: Previously followed by PCP LDL normal without a statin drug   Lab Results  Component Value Date   CHOL 162 03/08/2016   HDL 50.00 03/08/2016   LDLCALC 89 03/08/2016   TRIG 115.0 03/08/2016   CHOLHDL 3 03/08/2016           Hypertension:Treated for 10-15 years,  on lisinopril 10 mg, followed by PCP.  Also on Invokana  Most recent eye exam was: ?  Most recent foot exam: 5/17  Review of Systems  Gastrointestinal: Positive for diarrhea.    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 Fructosamine 01/19/2017 253  0 - 285 umol/L Final   Comment: Published reference interval for apparently healthy subjects between age 65 and 57 is 72 - 285 umol/L and in a poorly controlled diabetic population is 228 - 563 umol/L with a mean of 396 umol/L.   Marland Kitchen Glucose, Bld 01/19/2017 122* 70 - 99 mg/dL Final    Physical Examination:  BP (!) 142/98   Pulse 94   Ht 6\' 6"  (1.981 m)   Wt (!) 320 lb 9.6 oz (145.4 kg)   SpO2 95%   BMI 37.05 kg/m      ASSESSMENT:  Diabetes type 2, uncontrolled    See history of present illness for detailed discussion of current diabetes management, blood sugar patterns and problems identified  A1c Before starting Invokana was 11.5, now his blood sugars appear improved with fructosamine 253 However he is checking sugar very sporadically Does not check readings after meals Also he is eating a meal later at night and not taking insulin at that  Has gained 5 pounds despite starting Invokana, has not seen the dietitian as  recommended He does need to lose weight Fasting readings are mostly near normal  Hypertension: Blood pressure again high today  Chronic diarrhea: He has apparently seen gastroenterology and PCP and still unable to determine the cause  PLAN:     Start checking blood sugars more consistently.  He needs to take his evening insulin before eating instead of after  Recommended that he does not eat a large meal late at night and only have a snack since he is not taking insulin to cover this  He can then start eating breakfast on a regular basis with his morning insulin  He will try to leave off Victoza for a couple of days to see if diarrhea is better, he will call if this makes a difference  May consider recommending clonidine for blood pressure and possible relief of diarrhea which may be remotely related to neuropathy  Needs to establish with a new PCP  Needs A1c on the next visit  He will need to see the dietitian along with his wife for better meal planning  Needs follow-up lipids  Recommended more programmed walking or other exercise  Patient Instructions  Eat only small meal at bedtime  Check blood sugars on waking up  3/7 days  Also check blood sugars about 2 hours after a meal and do this after different meals by rotation  Recommended blood sugar levels on waking up is 90-130 and about 2 hours after meal is 130-160  Please bring your blood sugar monitor to each visit, thank you  Take pm insulin BEFORE the main meal  Total visit time for evaluation and management of multiple problems, counseling = 25 minutes   Lawrence Fisher 01/23/2017, 8:49 PM   Note: This office note was prepared with Insurance underwriterDragon voice recognition system technology. Any transcriptional errors that result from this process are unintentional.

## 2017-01-23 NOTE — Patient Instructions (Signed)
Eat only small meal at bedtime  Check blood sugars on waking up  3/7 days  Also check blood sugars about 2 hours after a meal and do this after different meals by rotation  Recommended blood sugar levels on waking up is 90-130 and about 2 hours after meal is 130-160  Please bring your blood sugar monitor to each visit, thank you  Take pm insulin BEFORE the main meal

## 2017-02-03 ENCOUNTER — Other Ambulatory Visit: Payer: Self-pay | Admitting: Endocrinology

## 2017-03-24 ENCOUNTER — Other Ambulatory Visit: Payer: BLUE CROSS/BLUE SHIELD

## 2017-03-26 NOTE — Progress Notes (Deleted)
Patient ID: Lawrence Fisher, male   DOB: 08/11/1971, 46 y.o.   MRN: [id number]           Reason for Appointment: Follow-up for Type 2 Diabetes  Referring physician: None   History of Present Illness:          Date of diagnosis of type 2 diabetes mellitus: ?  2015        Background history:   In 2015 or so he was having symptoms of weakness and increased thirst and was found to have glucose of 500 or so in an emergency room. He was initially treated with metformin 500 mg twice a day only Although his blood sugars improved significantly initially they were higher subsequently and have been persistently high He was taking Bydureon also for probably one year but his last A1c was 11% in 12/16  Recent history:   INSULIN regimen: Novolog mix, 20 am,  15 units ac/pc  Non-insulin hypoglycemic drugs the patient is taking are Victoza, Invokana 300 mg daily  His A1c in March was 11.5 However fructosamine now is 253  Current management, blood sugar patterns and problems identified:  He has been started back on Invokana on his last visit in March  However he is checking his blood sugars very sporadically and mostly before meals  Also has gained 5 pounds  He takes his Victoza and insulin in the morning before breakfast although may or may not eat anything in the morning  He says that he gets diarrhea frequently especially in the mornings and he thinks this is 15 minutes after he takes his morning injections; also however he has had diarrhea on and off for 2 years  Also he now says that he is usually eating a meal like a sandwich around 6 PM and then the full meal at bedtime  However he is taking his evening insulin right before or sometimes after eating his evening meal; he says his wife is helping him with his medications and he is not aware of what he is doing much  He has been recommended consultation with dietitian but he does not want to do this       Side effects from medications  have been: ?  Diarrhea from metformin  Compliance with the medical regimen: Fair  Glucose monitoring:  done 0-1  times a day         Glucometer:  Contour  Readings from download show  Mean values apply above for all meters except median for One Touch  PRE-MEAL Fasting Lunch Dinner 6 PM  Overall  Glucose range: 147-201   108-145  171, 181    Mean/median:     151       Self-care: The diet that the patient has been following is: tries to limit sugars .     Typical meal intake: Breakfast is cereal, eggs, sausage.  Lunch may be a sandwich.  Dinner is 7 pm,  meat or fried food, vegetables and salad.  For snacks he will have half sandwich, yogurt or granola bar               Dietician visit, most recent: Never               Exercise: Has been walking   Weight history:  Wt Readings from Last 3 Encounters:  01/23/17 (!) 320 lb 9.6 oz (145.4 kg)  11/10/16 (!) 315 lb (142.9 kg)  06/02/16 (!) 315 lb (142.9 kg)    Glycemic  control:    Lab Results  Component Value Date   HGBA1C 11.5 (H) 11/08/2016   HGBA1C 13.1 (H) 01/22/2016   HGBA1C HIGH 12/29/2015   Lab Results  Component Value Date   MICROALBUR 5.0 (H) 04/20/2016   LDLCALC 89 03/08/2016   CREATININE 1.14 11/08/2016   Lab Results  Component Value Date   MICRALBCREAT 5.7 04/20/2016    No visits with results within 1 Week(s) from this visit.  Latest known visit with results is:  Appointment on 01/19/2017  Component Date Value Ref Range Status  . Fructosamine 01/19/2017 253  0 - 285 umol/L Final   Comment: Published reference interval for apparently healthy subjects between age 45 and 7060 is 26205 - 285 umol/L and in a poorly controlled diabetic population is 228 - 563 umol/L with a mean of 396 umol/L.   Marland Kitchen. Glucose, Bld 01/19/2017 122* 70 - 99 mg/dL Final       Allergies as of 03/27/2017   No Known Allergies     Medication List       Accurate as of 03/26/17  5:06 PM. Always use your most recent med list.           BAYER MICROLET LANCETS lancets Use as instructed to check blood sugar 3 times per day dx code E11.65   canagliflozin 300 MG Tabs tablet Commonly known as:  INVOKANA Take 1 tablet (300 mg total) by mouth daily before breakfast.   gabapentin 300 MG capsule Commonly known as:  NEURONTIN Take 300 mg by mouth 3 (three) times daily.   glucose blood test strip Commonly known as:  BAYER CONTOUR NEXT TEST Use as instructed to check blood sugar 3 times per day Dx code E11.65   Insulin Pen Needle 31G X 5 MM Misc Use 3 per day to inject insulin   lisinopril 10 MG tablet Commonly known as:  PRINIVIL,ZESTRIL Take 10 mg by mouth daily. Reported on 03/08/2016   metFORMIN 500 MG 24 hr tablet Commonly known as:  GLUCOPHAGE-XR Take 2 tablets (1,000 mg total) by mouth daily with supper.   multivitamin capsule Take 1 capsule by mouth daily.   NOVOLOG MIX 70/30 FLEXPEN (70-30) 100 UNIT/ML FlexPen Generic drug:  insulin aspart protamine - aspart INJECT 30 UNITS IN THE MORNING AND 36 UNITS IN THE EVENING   oxyCODONE-acetaminophen 5-325 MG tablet Commonly known as:  PERCOCET/ROXICET Take 1-2 tablets by mouth every 4 (four) hours as needed for severe pain.   oxyCODONE-acetaminophen 5-325 MG tablet Commonly known as:  PERCOCET 1-2 tabs po q6 hours prn pain   VICTOZA 18 MG/3ML Sopn Generic drug:  liraglutide INJECT 0.6 MILLIGRAMS INTO THE SKIN DAILY FOR THE FIRST WEEK, THEN INCREASE TO 1.2 MILLIGRAMS DAILY THE SECOND WEEK       Allergies: No Known Allergies  Past Medical History:  Diagnosis Date  . Diabetes mellitus without complication (HCC)   . Hypertension     Past Surgical History:  Procedure Laterality Date  . ANKLE SURGERY    . OPEN REDUCTION INTERNAL FIXATION (ORIF) METACARPAL Right 06/02/2016   Procedure: OPEN REDUCTION INTERNAL FIXATION (ORIF) right ring phalanx and  METACARPAL fractures;  Surgeon: Betha LoaKevin Kuzma, MD;  Location: [facility];  Service: Orthopedics;   Laterality: Right;  OPEN REDUCTION INTERNAL FIXATION (ORIF) right ring phalanx and  METACARPAL fractures  . TONSILLECTOMY      No family history on file.  Social History:  reports that he has been smoking Cigarettes.  He has been smoking  about 0.50 packs per day. He has never used smokeless tobacco. He reports that he drinks alcohol. He reports that he uses drugs, including Marijuana.    Review of Systems    Lipid history: Previously followed by PCP LDL normal without a statin drug   Lab Results  Component Value Date   CHOL 162 03/08/2016   HDL 50.00 03/08/2016   LDLCALC 89 03/08/2016   TRIG 115.0 03/08/2016   CHOLHDL 3 03/08/2016           Hypertension:Treated for 10-15 years,  on lisinopril 10 mg, followed by PCP.  Also on Invokana  Most recent eye exam was: ?  Most recent foot exam: 5/17  Review of Systems  Taking gabapentin with relief of neuropathic pain   LABS:  No visits with results within 1 Week(s) from this visit.  Latest known visit with results is:  Appointment on 01/19/2017  Component Date Value Ref Range Status  . Fructosamine 01/19/2017 253  0 - 285 umol/L Final   Comment: Published reference interval for apparently healthy subjects between age 16 and 41 is 3 - 285 umol/L and in a poorly controlled diabetic population is 228 - 563 umol/L with a mean of 396 umol/L.   Marland Kitchen Glucose, Bld 01/19/2017 122* 70 - 99 mg/dL Final    Physical Examination:  There were no vitals taken for this visit.     ASSESSMENT:  Diabetes type 2, uncontrolled    See history of present illness for detailed discussion of current diabetes management, blood sugar patterns and problems identified  A1c Before starting Invokana was 11.5, now his blood sugars appear improved with fructosamine 253 However he is checking sugar very sporadically Does not check readings after meals Also he is eating a meal later at night and not taking insulin at that  Has gained 5 pounds  despite starting Invokana, has not seen the dietitian as recommended He does need to lose weight Fasting readings are mostly near normal  Hypertension: Blood pressure again high today  Chronic diarrhea: He has apparently seen gastroenterology and PCP and still unable to determine the cause  PLAN:     Start checking blood sugars more consistently.  He needs to take his evening insulin before eating instead of after  Recommended that he does not eat a large meal late at night and only have a snack since he is not taking insulin to cover this  He can then start eating breakfast on a regular basis with his morning insulin  He will try to leave off Victoza for a couple of days to see if diarrhea is better, he will call if this makes a difference  May consider recommending clonidine for blood pressure and possible relief of diarrhea which may be remotely related to neuropathy  Needs to establish with a new PCP  Needs A1c on the next visit  He will need to see the dietitian along with his wife for better meal planning  Needs follow-up lipids  Recommended more programmed walking or other exercise  There are no Patient Instructions on file for this visit.   Total visit time for evaluation and management of multiple problems, counseling = 25 minutes   Lawrence Fisher 03/26/2017, 5:06 PM   Note: This office note was prepared with Insurance underwriter. Any transcriptional errors that result from this process are unintentional.

## 2017-03-27 ENCOUNTER — Ambulatory Visit: Payer: BLUE CROSS/BLUE SHIELD | Admitting: Endocrinology

## 2017-03-27 DIAGNOSIS — Z0289 Encounter for other administrative examinations: Secondary | ICD-10-CM

## 2017-04-03 ENCOUNTER — Encounter (HOSPITAL_BASED_OUTPATIENT_CLINIC_OR_DEPARTMENT_OTHER): Payer: Self-pay | Admitting: *Deleted

## 2017-04-03 ENCOUNTER — Emergency Department (HOSPITAL_BASED_OUTPATIENT_CLINIC_OR_DEPARTMENT_OTHER)
Admission: EM | Admit: 2017-04-03 | Discharge: 2017-04-03 | Disposition: A | Discharge status: Home / Self Care | Payer: BLUE CROSS/BLUE SHIELD | Attending: Emergency Medicine | Admitting: Emergency Medicine

## 2017-04-03 ENCOUNTER — Emergency Department (HOSPITAL_BASED_OUTPATIENT_CLINIC_OR_DEPARTMENT_OTHER): Payer: BLUE CROSS/BLUE SHIELD

## 2017-04-03 DIAGNOSIS — E11628 Type 2 diabetes mellitus with other skin complications: Secondary | ICD-10-CM

## 2017-04-03 DIAGNOSIS — Z794 Long term (current) use of insulin: Secondary | ICD-10-CM | POA: Insufficient documentation

## 2017-04-03 DIAGNOSIS — L02214 Cutaneous abscess of groin: Secondary | ICD-10-CM | POA: Diagnosis not present

## 2017-04-03 DIAGNOSIS — L0291 Cutaneous abscess, unspecified: Secondary | ICD-10-CM

## 2017-04-03 DIAGNOSIS — E1165 Type 2 diabetes mellitus with hyperglycemia: Secondary | ICD-10-CM | POA: Diagnosis not present

## 2017-04-03 DIAGNOSIS — L03314 Cellulitis of groin: Secondary | ICD-10-CM | POA: Insufficient documentation

## 2017-04-03 DIAGNOSIS — F17210 Nicotine dependence, cigarettes, uncomplicated: Secondary | ICD-10-CM | POA: Insufficient documentation

## 2017-04-03 DIAGNOSIS — R1031 Right lower quadrant pain: Secondary | ICD-10-CM | POA: Diagnosis present

## 2017-04-03 DIAGNOSIS — I10 Essential (primary) hypertension: Secondary | ICD-10-CM | POA: Insufficient documentation

## 2017-04-03 LAB — CBC WITH DIFFERENTIAL/PLATELET
BASOS PCT: 0 %
Basophils Absolute: 0 10*3/uL (ref 0.0–0.1)
Eosinophils Absolute: 0.2 10*3/uL (ref 0.0–0.7)
Eosinophils Relative: 2 %
HEMATOCRIT: 47.9 % (ref 39.0–52.0)
HEMOGLOBIN: 16.5 g/dL (ref 13.0–17.0)
LYMPHS ABS: 1.9 10*3/uL (ref 0.7–4.0)
Lymphocytes Relative: 17 %
MCH: 26.5 pg (ref 26.0–34.0)
MCHC: 34.4 g/dL (ref 30.0–36.0)
MCV: 76.9 fL — ABNORMAL LOW (ref 78.0–100.0)
MONOS PCT: 10 %
Monocytes Absolute: 1.1 10*3/uL — ABNORMAL HIGH (ref 0.1–1.0)
NEUTROS ABS: 8 10*3/uL — AB (ref 1.7–7.7)
NEUTROS PCT: 71 %
Platelets: 260 10*3/uL (ref 150–400)
RBC: 6.23 MIL/uL — AB (ref 4.22–5.81)
RDW: 17.1 % — ABNORMAL HIGH (ref 11.5–15.5)
WBC: 11.1 10*3/uL — ABNORMAL HIGH (ref 4.0–10.5)

## 2017-04-03 LAB — COMPREHENSIVE METABOLIC PANEL
ALT: 18 U/L (ref 17–63)
ANION GAP: 9 (ref 5–15)
AST: 15 U/L (ref 15–41)
Albumin: 4 g/dL (ref 3.5–5.0)
Alkaline Phosphatase: 64 U/L (ref 38–126)
BUN: 12 mg/dL (ref 6–20)
CHLORIDE: 102 mmol/L (ref 101–111)
CO2: 26 mmol/L (ref 22–32)
Calcium: 9.1 mg/dL (ref 8.9–10.3)
Creatinine, Ser: 1.1 mg/dL (ref 0.61–1.24)
GFR calc non Af Amer: >60 mL/min (ref >60)
Glucose, Bld: 204 mg/dL — ABNORMAL HIGH (ref 65–99)
Potassium: 4.5 mmol/L (ref 3.5–5.1)
SODIUM: 137 mmol/L (ref 135–145)
Total Bilirubin: 0.8 mg/dL (ref 0.3–1.2)
Total Protein: 8.2 g/dL — ABNORMAL HIGH (ref 6.5–8.1)

## 2017-04-03 LAB — I-STAT CG4 LACTIC ACID, ED: Lactic Acid, Venous: 2.19 mmol/L (ref 0.5–1.9)

## 2017-04-03 MED ORDER — CEPHALEXIN 500 MG PO CAPS: 1000.0000 mg | ORAL_CAPSULE | Freq: Two times a day (BID) | ORAL | 0 refills | Status: DC

## 2017-04-03 MED ORDER — LIDOCAINE HCL (PF) 2 % IJ SOLN
INTRAMUSCULAR | Status: AC
  Filled 2017-04-03: qty 2

## 2017-04-03 MED ORDER — CEFTRIAXONE SODIUM 2 G IJ SOLR
2.0000 g | Freq: Once | INTRAMUSCULAR | Status: AC
  Administered 2017-04-03: 2 g via INTRAVENOUS
  Filled 2017-04-03: qty 2

## 2017-04-03 MED ORDER — MORPHINE SULFATE (PF) 4 MG/ML IV SOLN
4.0000 mg | Freq: Once | INTRAVENOUS | Status: AC
  Administered 2017-04-03: 4 mg via INTRAVENOUS
  Filled 2017-04-03: qty 1

## 2017-04-03 MED ORDER — ACETAMINOPHEN 500 MG PO TABS: 1000.0000 mg | ORAL_TABLET | Freq: Four times a day (QID) | ORAL | 0 refills | Status: DC | PRN

## 2017-04-03 MED ORDER — IOPAMIDOL (ISOVUE-300) INJECTION 61%
100.0000 mL | Freq: Once | INTRAVENOUS | Status: AC | PRN
  Administered 2017-04-03: 100 mL via INTRAVENOUS

## 2017-04-03 MED ORDER — TRAMADOL HCL 50 MG PO TABS: 100.0000 mg | ORAL_TABLET | Freq: Four times a day (QID) | ORAL | 0 refills | Status: DC | PRN

## 2017-04-03 MED ORDER — SULFAMETHOXAZOLE-TRIMETHOPRIM 800-160 MG PO TABS: 1.0000 | ORAL_TABLET | Freq: Two times a day (BID) | ORAL | 0 refills | Status: AC

## 2017-04-03 MED ORDER — LIDOCAINE-EPINEPHRINE (PF) 2 %-1:200000 IJ SOLN: 10.0000 mL | Freq: Once | INTRAMUSCULAR | Status: DC

## 2017-04-03 MED ORDER — SODIUM CHLORIDE 0.9 % IV SOLN
Freq: Once | INTRAVENOUS | Status: AC
  Administered 2017-04-03: 08:00:00 via INTRAVENOUS

## 2017-04-03 NOTE — ED Triage Notes (Signed)
Pt c/o abscess to abd x 3 days

## 2017-04-03 NOTE — ED Provider Notes (Signed)
[facility]   CSN: [id number] Arrival date & time: 04/03/17  7829     History   Chief Complaint Chief Complaint  Patient presents with  . Abscess    HPI Lawrence Fisher is a 46 y.o. male.  HPI Patient reports he's been developing a swollen tender area in his right groin. If the developing for about 3 days. He states is extremely tender now. Patient has severe pain to sit upright. He reports he has had pain radiating into the lower abdomen. He reports chills but has not decremented fever. Patient endorses general nausea. He reports his blood sugar has been controlled as of yesterday. He reports it was in the 100s. He has been compliant with his insulin. Past Medical History:  Diagnosis Date  . Diabetes mellitus without complication (HCC)   . Hypertension     Patient Active Problem List   Diagnosis Date Noted  . Uncontrolled type 2 diabetes mellitus with hyperglycemia, without long-term current use of insulin (HCC) 12/29/2015    Past Surgical History:  Procedure Laterality Date  . ANKLE SURGERY    . OPEN REDUCTION INTERNAL FIXATION (ORIF) METACARPAL Right 06/02/2016   Procedure: OPEN REDUCTION INTERNAL FIXATION (ORIF) right ring phalanx and  METACARPAL fractures;  Surgeon: Betha Loa, MD;  Location: [facility];  Service: Orthopedics;  Laterality: Right;  OPEN REDUCTION INTERNAL FIXATION (ORIF) right ring phalanx and  METACARPAL fractures  . TONSILLECTOMY         Home Medications    Prior to Admission medications   Medication Sig Start Date End Date Taking? Authorizing Provider  BAYER MICROLET LANCETS lancets Use as instructed to check blood sugar 3 times per day dx code E11.65 12/29/15   Reather Littler, MD  canagliflozin (INVOKANA) 300 MG TABS tablet Take 1 tablet (300 mg total) by mouth daily before breakfast. 11/10/16   Reather Littler, MD  glucose blood (BAYER CONTOUR NEXT TEST) test strip Use as instructed to check blood sugar 3 times  per day Dx code E11.65 11/10/16   Reather Littler, MD  Insulin Pen Needle 31G X 5 MM MISC Use 3 per day to inject insulin 11/10/16   Reather Littler, MD  lisinopril (PRINIVIL,ZESTRIL) 10 MG tablet Take 10 mg by mouth daily. Reported on 03/08/2016    [provider]  metFORMIN (GLUCOPHAGE-XR) 500 MG 24 hr tablet Take 2 tablets (1,000 mg total) by mouth daily with supper. Patient not taking: Reported on 11/10/2016 04/20/16   Reather Littler, MD  Multiple Vitamin (MULTIVITAMIN) capsule Take 1 capsule by mouth daily.    [provider]  NOVOLOG MIX 70/30 FLEXPEN (70-30) 100 UNIT/ML FlexPen INJECT 30 UNITS IN THE MORNING AND 36 UNITS IN THE EVENING 02/06/17   Reather Littler, MD  oxyCODONE-acetaminophen (PERCOCET) 5-325 MG tablet 1-2 tabs po q6 hours prn pain Patient not taking: Reported on 11/10/2016 06/02/16   Betha Loa, MD  oxyCODONE-acetaminophen (PERCOCET/ROXICET) 5-325 MG tablet Take 1-2 tablets by mouth every 4 (four) hours as needed for severe pain. Patient not taking: Reported on 11/10/2016 05/20/16   Emi Holes, PA-C  VICTOZA 18 MG/3ML SOPN INJECT 0.6 MILLIGRAMS INTO THE SKIN DAILY FOR THE FIRST WEEK, THEN INCREASE TO 1.2 MILLIGRAMS DAILY THE SECOND WEEK 12/29/16   Reather Littler, MD    Family History History reviewed. No pertinent family history.  Social History Social History  Substance Use Topics  . Smoking status: Current Every Day Smoker    Packs/day: 0.50  Types: Cigarettes  . Smokeless tobacco: Never Used  . Alcohol use 0.0 oz/week     Comment: occ     Allergies   Patient has no known allergies.   Review of Systems Review of Systems 10 Systems reviewed and are negative for acute change except as noted in the HPI.   Physical Exam Updated Vital Signs BP (!) 160/70 (BP Location: Right Arm)   Pulse 86   Temp 98.1 F (36.7 C) (Oral)   Resp 16   Ht 6' 6.5" (1.994 m)   Wt 136.1 kg (300 lb)   SpO2 98%   BMI 34.23 kg/m   Physical Exam  Constitutional: He is  oriented to person, place, and time.  Patient is alert and nontoxic. No rotatory distress. He appears to be in moderate pain.  HENT:  Head: Normocephalic and atraumatic.  Eyes: EOM are normal.  Cardiovascular: Normal rate, regular rhythm, normal heart sounds and intact distal pulses.   Pulmonary/Chest: Effort normal and breath sounds normal.  Abdominal: Soft. Bowel sounds are normal.  Mild diffuse lower abdominal tenderness in the suprapubic region. Patient has diffuse very tender, warm mass in the right inguinal region. Diffuse erythema. No focal fluctuance. No swelling of the penis or scrotum. No tenderness of the spermatic cord.  Musculoskeletal: Normal range of motion. He exhibits no edema or tenderness.  Neurological: He is alert and oriented to person, place, and time. No cranial nerve deficit. He exhibits normal muscle tone. Coordination normal.  Skin: Skin is warm and dry.  Psychiatric: He has a normal mood and affect.     ED Treatments / Results  Labs (all labs ordered are listed, but only abnormal results are displayed) Labs Reviewed  COMPREHENSIVE METABOLIC PANEL - Abnormal; Notable for the following:       Result Value   Glucose, Bld 204 (*)    Total Protein 8.2 (*)    All other components within normal limits  CBC WITH DIFFERENTIAL/PLATELET - Abnormal; Notable for the following:    WBC 11.1 (*)    RBC 6.23 (*)    MCV 76.9 (*)    RDW 17.1 (*)    Neutro Abs 8.0 (*)    Monocytes Absolute 1.1 (*)    All other components within normal limits  I-STAT CG4 LACTIC ACID, ED - Abnormal; Notable for the following:    Lactic Acid, Venous 2.19 (*)    All other components within normal limits  CULTURE, BLOOD (ROUTINE X 2)  CULTURE, BLOOD (ROUTINE X 2)  AEROBIC CULTURE (SUPERFICIAL SPECIMEN)  URINALYSIS, ROUTINE W REFLEX MICROSCOPIC    EKG  EKG Interpretation None       Radiology Ct Pelvis W Contrast  Result Date: 04/03/2017 CLINICAL DATA:  Evaluate for abscess.  EXAM: CT PELVIS WITH CONTRAST TECHNIQUE: Multidetector CT imaging of the pelvis was performed using the standard protocol following the bolus administration of intravenous contrast. CONTRAST:  100mL ISOVUE-300 IOPAMIDOL (ISOVUE-300) INJECTION 61% COMPARISON:  12/30/2014 FINDINGS: Urinary Tract:  No abnormality visualized. Bowel:  Unremarkable visualized pelvic bowel loops. Vascular/Lymphatic: Aortic atherosclerosis is identified. Prominent inguinal lymph nodes are identified bilaterally. Index right inguinal lymph node measures 11 mm, image 112 of series 5. Reproductive:  No mass or other significant abnormality Other: Within the right inguinal region there is a small subcutaneous fluid collection measuring 2.7 x 1.5 by 2.2 cm. Surrounding skin thickening and subcutaneous fat stranding is identified indicating cellulitis. Musculoskeletal: No aggressive lytic or sclerotic bone lesions. IMPRESSION: 1. Small right inguinal  subcutaneous abscess measures 2.7 cm. This should be easily accessible to percutaneous needle aspiration. Surrounding inflammatory changes compatible with cellulitis noted. Electronically Signed   By: Signa Kell M.D.   On: 04/03/2017 08:47    Procedures .Marland KitchenIncision and Drainage Date/Time: 04/03/2017 9:56 AM Performed by: Arby Barrette Authorized by: Arby Barrette   Consent:    Consent obtained:  Verbal   Consent given by:  Patient   Risks discussed:  Bleeding, incomplete drainage, pain and infection Location:    Type:  Abscess   Size:  2.5   Location:  Trunk Pre-procedure details:    Skin preparation:  Betadine Anesthesia (see MAR for exact dosages):    Anesthesia method:  Local infiltration   Local anesthetic:  Lidocaine 2% w/o epi Procedure type:    Complexity:  Complex Procedure details:    Incision types:  Single with marsupialization   Incision depth:  Subcutaneous   Scalpel blade:  11   Wound management:  Probed and deloculated   Drainage:  Purulent and  bloody   Drainage amount:  Moderate   Packing materials:  1/4 in iodoform gauze   Amount 1/4" iodoform:  8 Post-procedure details:    Patient tolerance of procedure:  Tolerated well, no immediate complications   (including critical care time)  Medications Ordered in ED Medications  lidocaine (XYLOCAINE) 2 % injection (not administered)  lidocaine-EPINEPHrine (XYLOCAINE W/EPI) 2 %-1:200000 (PF) injection 10 mL (not administered)  cefTRIAXone (ROCEPHIN) 2 g in dextrose 5 % 50 mL IVPB (0 g Intravenous Stopped 04/03/17 0916)  0.9 %  sodium chloride infusion ( Intravenous New Bag/Given 04/03/17 0755)  morphine 4 MG/ML injection 4 mg (4 mg Intravenous Given 04/03/17 0754)  iopamidol (ISOVUE-300) 61 % injection 100 mL (100 mLs Intravenous Contrast Given 04/03/17 0831)     Initial Impression / Assessment and Plan / ED Course  I have reviewed the triage vital signs and the nursing notes.  Pertinent labs & imaging results that were available during my care of the patient were reviewed by me and considered in my medical decision making (see chart for details).     Final Clinical Impressions(s) / ED Diagnoses   Final diagnoses:  Abscess  Cellulitis of groin  Type 2 diabetes mellitus with other skin complication, with long-term current use of insulin (HCC)   Patient is inguinal abscess with associated cellulitis. CT scan does not show any deep penetration of abscess nor extension into the perineum. Patient does have mild lactic acidosis and leukocytosis but is nontoxic with blood sugars controlled. At this time, with stable vital signs and no extension to deeper structures, with abscess drain I feel the patient is stable for outpatient management. Return precautions have been reviewed. Patient is aware of need for recheck within 48 hours. We've also reviewed need for recheck if expanding erythema, fever or general constitutional symptoms. Wound culture pending. Patient started on Bactrim and  Keflex. New Prescriptions New Prescriptions   No medications on file     Arby Barrette, MD 04/03/17 1011

## 2017-04-03 NOTE — ED Notes (Signed)
Patient transported to CT 

## 2017-04-05 LAB — AEROBIC CULTURE W GRAM STAIN (SUPERFICIAL SPECIMEN)

## 2017-04-05 LAB — AEROBIC CULTURE  (SUPERFICIAL SPECIMEN)

## 2017-04-06 ENCOUNTER — Telehealth: Payer: Self-pay | Admitting: *Deleted

## 2017-04-06 NOTE — Telephone Encounter (Signed)
Post ED Visit - Positive Culture Follow-up  Culture report reviewed by antimicrobial stewardship pharmacist:  []  Enzo BiNathan Batchelder, Pharm.D. []  Celedonio MiyamotoJeremy Frens, Pharm.D., BCPS AQ-ID []  Garvin FilaMike Maccia, Pharm.D., BCPS []  Georgina PillionElizabeth Martin, Pharm.D., BCPS []  PinckneyvilleMinh Pham, [address] Rainbow BoulevardPharm.D., BCPS, AAHIVP []  Estella HuskMichelle Turner, Pharm.D., BCPS, AAHIVP []  Lysle Pearlachel Rumbarger, PharmD, BCPS []  Casilda Carlsaylor Stone, PharmD, BCPS []  Pollyann SamplesAndy Johnston, PharmD, BCPS Verlan FriendsErin Deja, PharmD  Positive wound culture Treated with Cephalexin and Sulfamethoxazole-Trimethoprim, organism sensitive to the same and no further patient follow-up is required at this time.  Virl AxeRobertson, Sheria Rosello [facility] 04/06/2017, 12:36 PM

## 2017-04-08 LAB — CULTURE, BLOOD (ROUTINE X 2)
CULTURE: NO GROWTH
CULTURE: NO GROWTH
SPECIAL REQUESTS: ADEQUATE
Special Requests: ADEQUATE

## 2018-04-22 ENCOUNTER — Emergency Department (HOSPITAL_BASED_OUTPATIENT_CLINIC_OR_DEPARTMENT_OTHER)
Admission: EM | Admit: 2018-04-22 | Discharge: 2018-04-22 | Disposition: A | Discharge status: Home / Self Care | Payer: BLUE CROSS/BLUE SHIELD | Attending: Emergency Medicine | Admitting: Emergency Medicine

## 2018-04-22 ENCOUNTER — Emergency Department (HOSPITAL_BASED_OUTPATIENT_CLINIC_OR_DEPARTMENT_OTHER): Payer: BLUE CROSS/BLUE SHIELD

## 2018-04-22 ENCOUNTER — Other Ambulatory Visit: Payer: Self-pay

## 2018-04-22 ENCOUNTER — Encounter (HOSPITAL_BASED_OUTPATIENT_CLINIC_OR_DEPARTMENT_OTHER): Payer: Self-pay | Admitting: Emergency Medicine

## 2018-04-22 DIAGNOSIS — Y9389 Activity, other specified: Secondary | ICD-10-CM | POA: Diagnosis not present

## 2018-04-22 DIAGNOSIS — F17210 Nicotine dependence, cigarettes, uncomplicated: Secondary | ICD-10-CM | POA: Insufficient documentation

## 2018-04-22 DIAGNOSIS — S060X9A Concussion with loss of consciousness of unspecified duration, initial encounter: Secondary | ICD-10-CM

## 2018-04-22 DIAGNOSIS — Y9289 Other specified places as the place of occurrence of the external cause: Secondary | ICD-10-CM | POA: Insufficient documentation

## 2018-04-22 DIAGNOSIS — Y999 Unspecified external cause status: Secondary | ICD-10-CM | POA: Insufficient documentation

## 2018-04-22 DIAGNOSIS — S43014A Anterior dislocation of right humerus, initial encounter: Secondary | ICD-10-CM | POA: Diagnosis not present

## 2018-04-22 DIAGNOSIS — S4991XA Unspecified injury of right shoulder and upper arm, initial encounter: Secondary | ICD-10-CM | POA: Diagnosis present

## 2018-04-22 DIAGNOSIS — I10 Essential (primary) hypertension: Secondary | ICD-10-CM | POA: Diagnosis not present

## 2018-04-22 DIAGNOSIS — M79672 Pain in left foot: Secondary | ICD-10-CM | POA: Diagnosis not present

## 2018-04-22 DIAGNOSIS — E119 Type 2 diabetes mellitus without complications: Secondary | ICD-10-CM | POA: Diagnosis not present

## 2018-04-22 DIAGNOSIS — S60511A Abrasion of right hand, initial encounter: Secondary | ICD-10-CM

## 2018-04-22 DIAGNOSIS — Z79899 Other long term (current) drug therapy: Secondary | ICD-10-CM | POA: Diagnosis not present

## 2018-04-22 DIAGNOSIS — S161XXA Strain of muscle, fascia and tendon at neck level, initial encounter: Secondary | ICD-10-CM | POA: Insufficient documentation

## 2018-04-22 DIAGNOSIS — S8392XA Sprain of unspecified site of left knee, initial encounter: Secondary | ICD-10-CM

## 2018-04-22 MED ORDER — INSULIN ASPART PROT & ASPART (70-30 MIX) 100 UNIT/ML PEN: PEN_INJECTOR | SUBCUTANEOUS | 11 refills | Status: DC

## 2018-04-22 MED ORDER — HYDROCODONE-ACETAMINOPHEN 5-325 MG PO TABS: 1.0000 | ORAL_TABLET | Freq: Four times a day (QID) | ORAL | 0 refills | Status: DC | PRN

## 2018-04-22 MED ORDER — HYDROMORPHONE HCL 1 MG/ML IJ SOLN
INTRAMUSCULAR | Status: AC
  Filled 2018-04-22: qty 1

## 2018-04-22 MED ORDER — METFORMIN HCL ER 500 MG PO TB24: 500.0000 mg | ORAL_TABLET | Freq: Every day | ORAL | 0 refills | Status: DC

## 2018-04-22 MED ORDER — CANAGLIFLOZIN 300 MG PO TABS: 300.0000 mg | ORAL_TABLET | Freq: Every day | ORAL | 0 refills | Status: DC

## 2018-04-22 MED ORDER — HYDROMORPHONE HCL 1 MG/ML IJ SOLN
1.0000 mg | Freq: Once | INTRAMUSCULAR | Status: AC
  Administered 2018-04-22: 1 mg via INTRAVENOUS

## 2018-04-22 MED ORDER — PROPOFOL 10 MG/ML IV BOLUS
INTRAVENOUS | Status: AC | PRN
  Administered 2018-04-22 (×4): 40 mg via INTRAVENOUS

## 2018-04-22 MED ORDER — AMOXICILLIN-POT CLAVULANATE 875-125 MG PO TABS: 1.0000 | ORAL_TABLET | Freq: Two times a day (BID) | ORAL | 0 refills | Status: DC

## 2018-04-22 MED ORDER — PROPOFOL 10 MG/ML IV BOLUS
40.0000 mg | Freq: Once | INTRAVENOUS | Status: DC
  Filled 2018-04-22: qty 20

## 2018-04-22 MED ORDER — ONDANSETRON HCL 4 MG/2ML IJ SOLN
INTRAMUSCULAR | Status: AC
  Filled 2018-04-22: qty 2

## 2018-04-22 MED ORDER — ONDANSETRON HCL 4 MG/2ML IJ SOLN
4.0000 mg | Freq: Once | INTRAMUSCULAR | Status: AC
  Administered 2018-04-22: 4 mg via INTRAVENOUS

## 2018-04-22 NOTE — Sedation Documentation (Signed)
Rates pain at an 8

## 2018-04-22 NOTE — ED Notes (Signed)
Patient transported to CT 

## 2018-04-22 NOTE — ED Notes (Signed)
Patient transported to X-ray 
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
## 2018-04-22 NOTE — Sedation Documentation (Signed)
Rates pain at an 8 

## 2018-04-22 NOTE — ED Notes (Signed)
Pt ambulated without difficulty

## 2018-04-22 NOTE — ED Triage Notes (Signed)
Pt c/o 10/10 right shoulder, right hand and left knee pain after he fell one hour ago.

## 2018-04-22 NOTE — ED Notes (Signed)
Pt given water 

## 2018-05-21 DIAGNOSIS — S43004A Unspecified dislocation of right shoulder joint, initial encounter: Secondary | ICD-10-CM | POA: Insufficient documentation

## 2018-05-21 HISTORY — DX: Unspecified dislocation of right shoulder joint, initial encounter: S43.004A

## 2018-07-05 IMAGING — CT CT PELVIS W/ CM
2 of 4 series · 16 of 46 positions shown, 18 images · IV contrast (iopamidol)
Comparison: 12/30/2014

CLINICAL DATA: Evaluate for abscess.

EXAM:
CT PELVIS WITH CONTRAST
TECHNIQUE: Multidetector CT imaging of the pelvis was performed using the
standard protocol following the bolus administration of intravenous
contrast.
CONTRAST:  100mL [id number] IOPAMIDOL ([id number]) INJECTION 61%

[Series 5: axial soft tissue · axial · 0.98mm/px · z∈[-413,-59]mm · 13 of 205 slices shown, 15 images]
[im 14/205  soft-tissue]
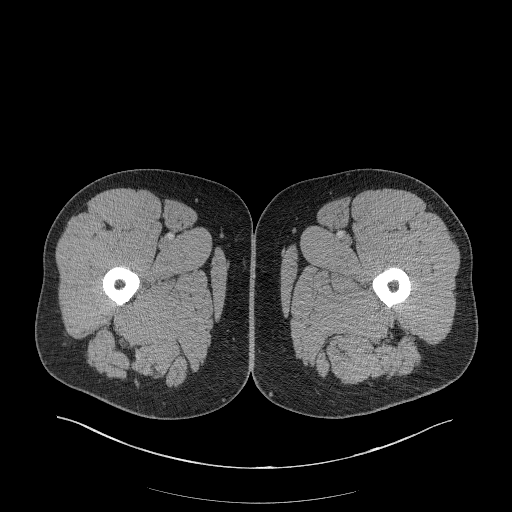
[im 14/205  bone]
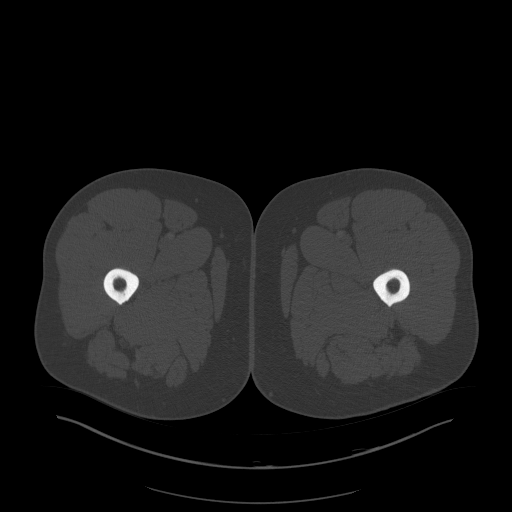
[im 27/205  soft-tissue]
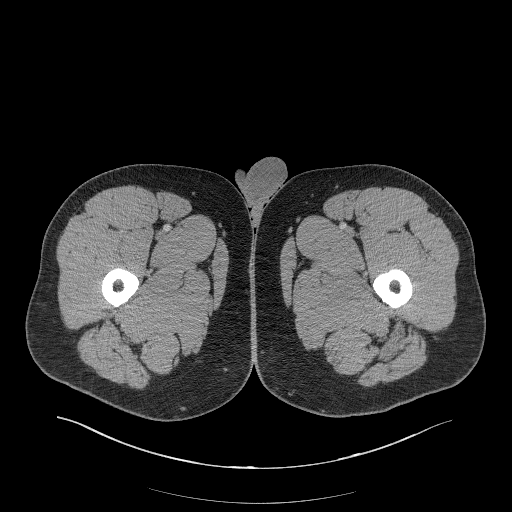
[im 40/205  soft-tissue]
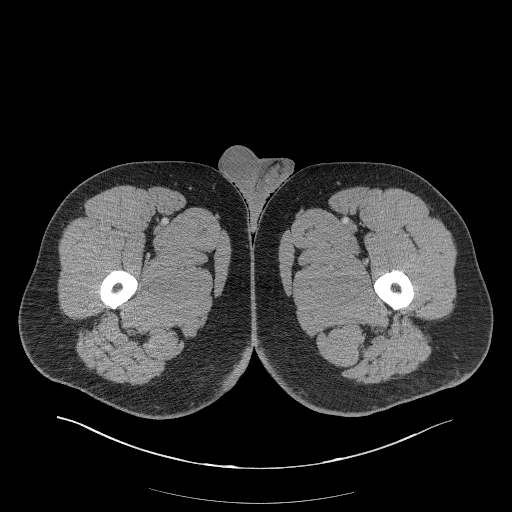
[im 60/205  soft-tissue]
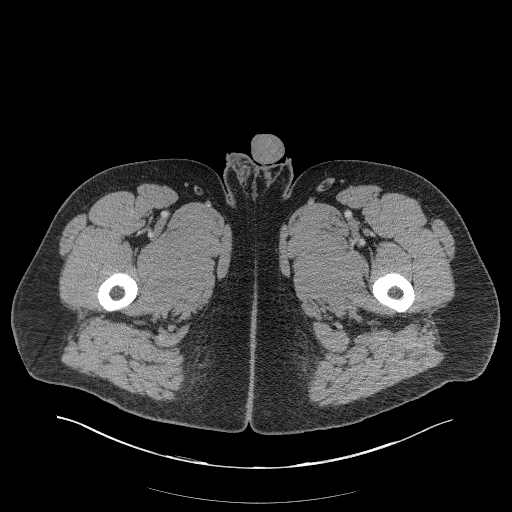
[im 73/205  soft-tissue]
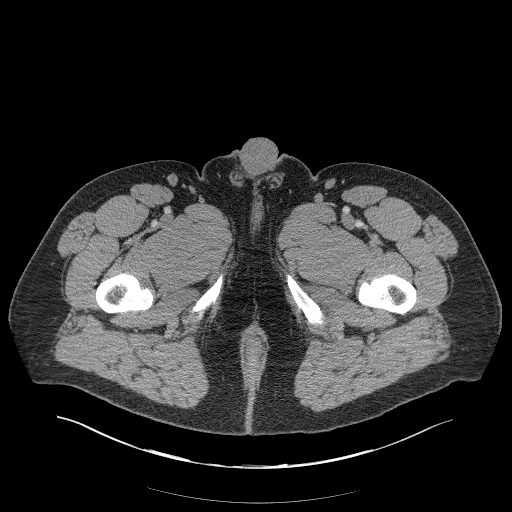
[im 86/205  soft-tissue]
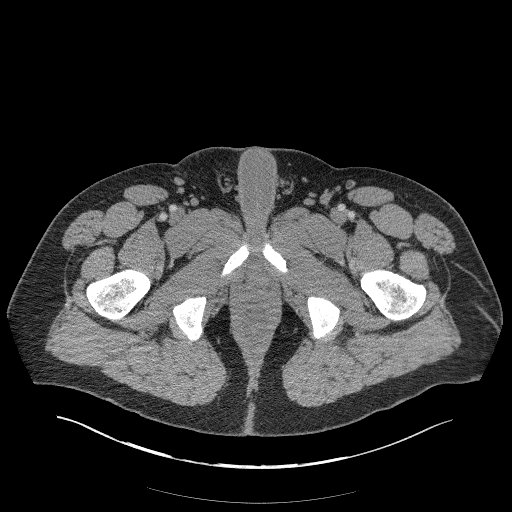
[im 106/205  soft-tissue]
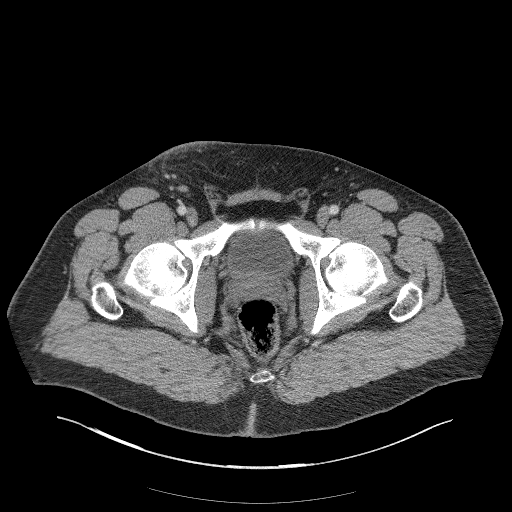
[im 119/205  soft-tissue]
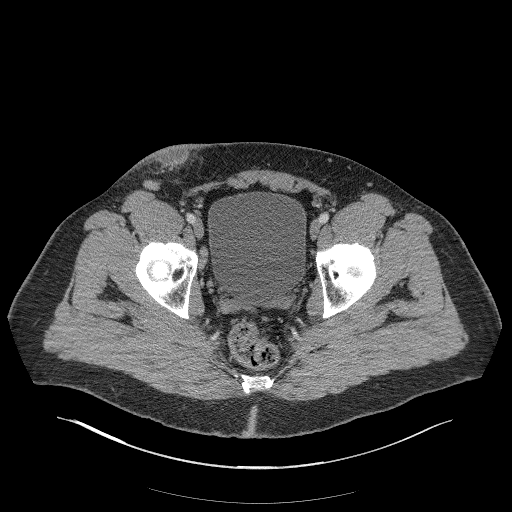
[im 132/205  soft-tissue]
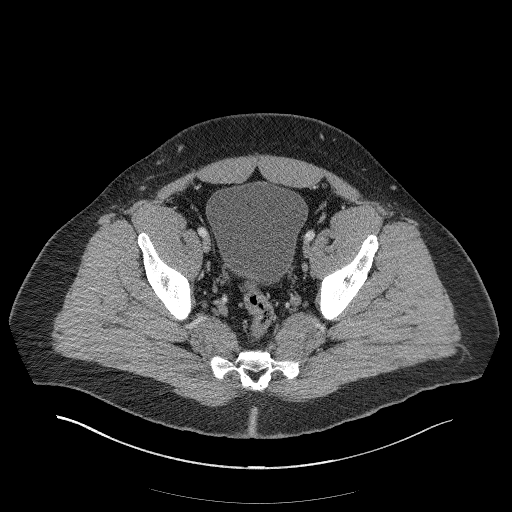
[im 132/205  bone]
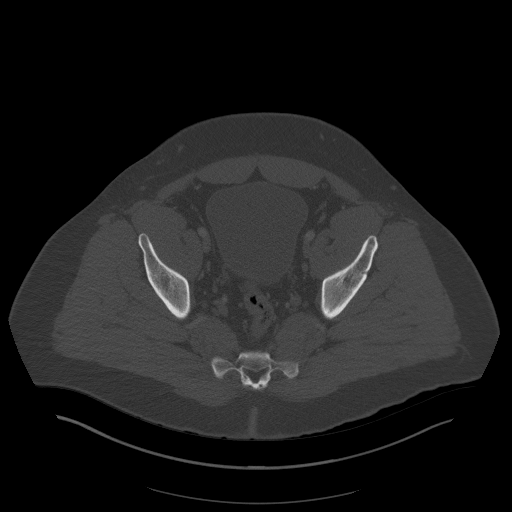
[im 145/205  soft-tissue]
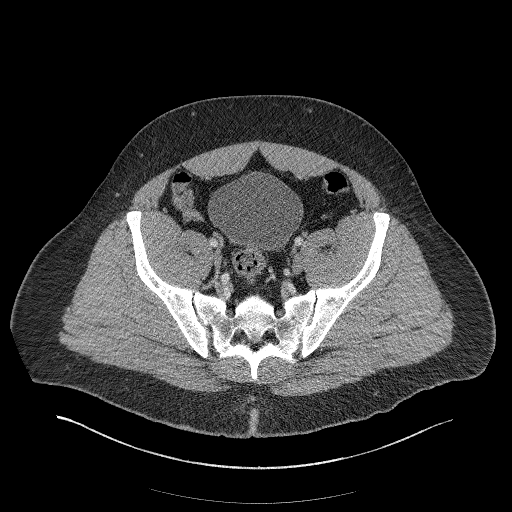
[im 165/205  soft-tissue]
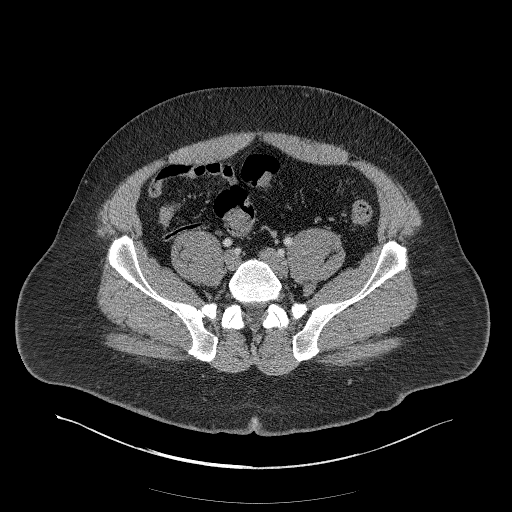
[im 178/205  soft-tissue]
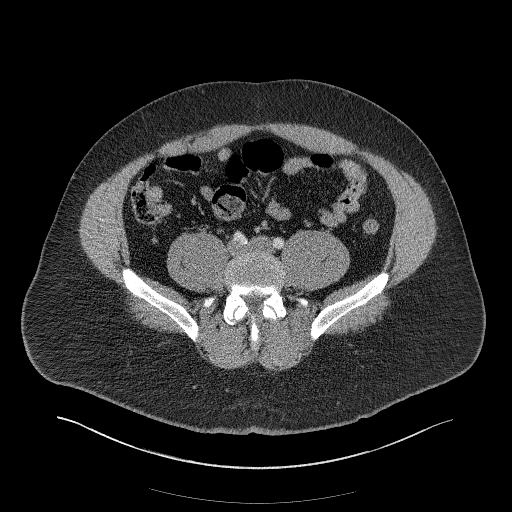
[im 191/205  soft-tissue]
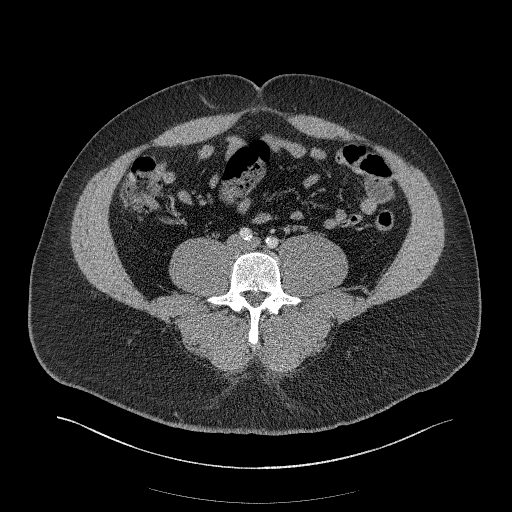

[Series 8: coronal st · coronal · 0.80mm/px · 3 of 190 slices shown]
[im 38/190  soft-tissue]
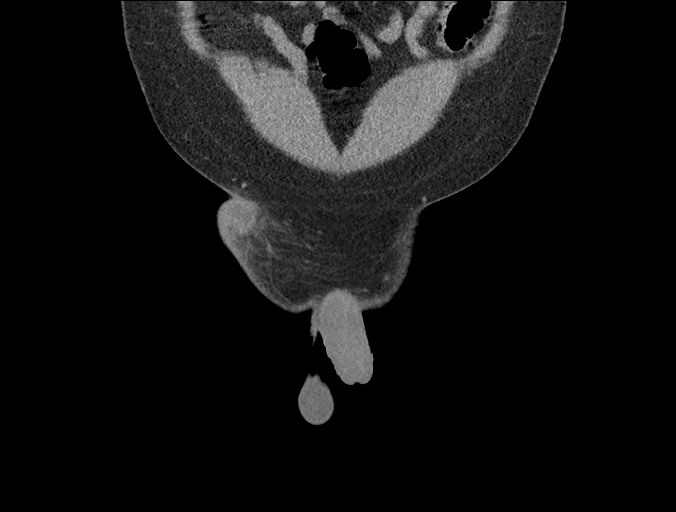
[im 76/190  soft-tissue]
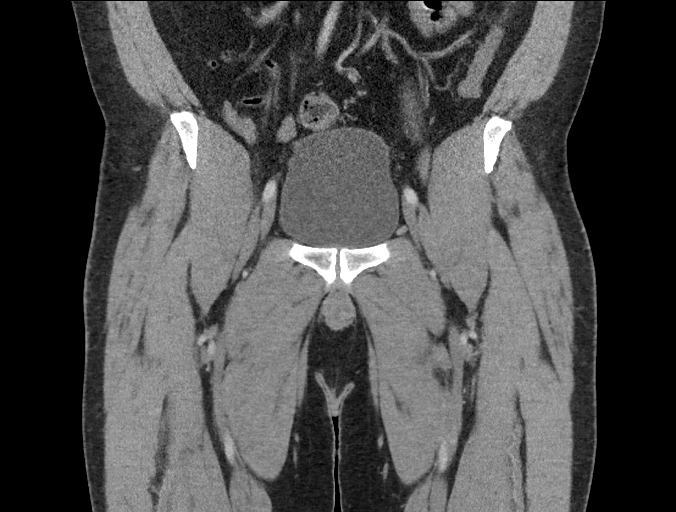
[im 114/190  soft-tissue]
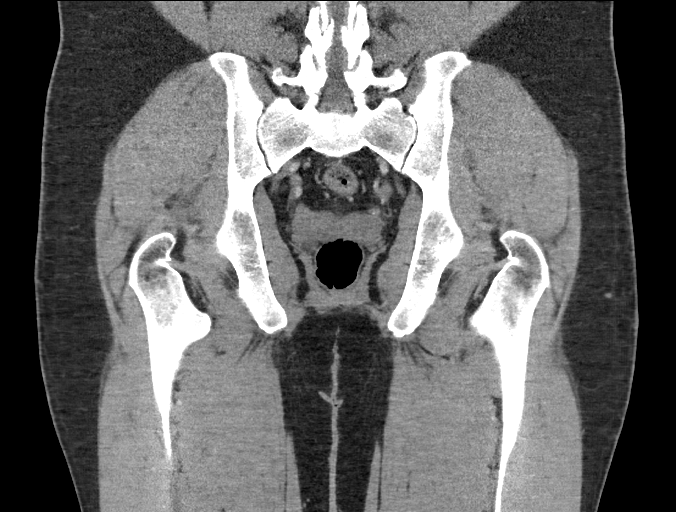

[16 of 46 positions shown; findings below may reference images not displayed]

FINDINGS: Urinary Tract:  No abnormality visualized.

Bowel:  Unremarkable visualized pelvic bowel loops.

Vascular/Lymphatic: Aortic atherosclerosis is identified. Prominent
inguinal lymph nodes are identified bilaterally. Index right
inguinal lymph node measures 11 mm, image 112 of series 5.

Reproductive:  No mass or other significant abnormality

Other: Within the right inguinal region there is a small
subcutaneous fluid collection measuring 2.7 x 1.5 by 2.2 cm.
Surrounding skin thickening and subcutaneous fat stranding is
identified indicating cellulitis.

Musculoskeletal: No aggressive lytic or sclerotic bone lesions.
IMPRESSION: 1. Small right inguinal subcutaneous abscess measures 2.7 cm. This
should be easily accessible to percutaneous needle aspiration.
Surrounding inflammatory changes compatible with cellulitis noted.

## 2018-07-10 DIAGNOSIS — E782 Mixed hyperlipidemia: Secondary | ICD-10-CM

## 2018-07-10 DIAGNOSIS — E1129 Type 2 diabetes mellitus with other diabetic kidney complication: Secondary | ICD-10-CM

## 2018-07-10 HISTORY — DX: Proteinuria, unspecified: E11.29

## 2018-07-10 HISTORY — DX: Mixed hyperlipidemia: E78.2

## 2018-09-25 DIAGNOSIS — M25511 Pain in right shoulder: Secondary | ICD-10-CM | POA: Insufficient documentation

## 2018-09-25 HISTORY — DX: Pain in right shoulder: M25.511

## 2018-10-18 DIAGNOSIS — E66811 Obesity, class 1: Secondary | ICD-10-CM

## 2018-10-18 DIAGNOSIS — Z6833 Body mass index (BMI) 33.0-33.9, adult: Secondary | ICD-10-CM | POA: Insufficient documentation

## 2018-10-18 DIAGNOSIS — E1140 Type 2 diabetes mellitus with diabetic neuropathy, unspecified: Secondary | ICD-10-CM

## 2018-10-18 HISTORY — DX: Type 2 diabetes mellitus with diabetic neuropathy, unspecified: E11.40

## 2018-10-18 HISTORY — DX: Obesity, class 1: E66.811

## 2019-12-22 NOTE — Progress Notes (Signed)
This encounter was created in error - please disregard.

## 2019-12-23 ENCOUNTER — Encounter: Payer: BLUE CROSS/BLUE SHIELD | Admitting: Endocrinology

## 2019-12-23 ENCOUNTER — Encounter: Payer: Self-pay | Admitting: Endocrinology

## 2019-12-23 DIAGNOSIS — Z0289 Encounter for other administrative examinations: Secondary | ICD-10-CM

## 2020-09-23 NOTE — Progress Notes (Signed)
Patient ID: Lawrence Fisher, male   DOB: 1971-03-15, 50 y.o.   MRN: [id number]          Reason for Appointment: Consultation for Type 2 Diabetes  Referring PCP: Heron NayYoung, Lauren E, PA   History of Present Illness:          Date of diagnosis of type 2 diabetes mellitus:2015        Background history:   In 2015  he was having symptoms of weakness and increased thirst and was found to have glucose of about 500 in emergency room. He was initially treated with metformin 500 mg twice a day only Although his blood sugars improved significantly initially they were higher subsequently  He started Bydureon in 2016 Appears to have had persistently poor control with A1c 11% in 12/16 Also A1c in 3/21 was higher at 11.4 compared to 9.0 the previous year  Recent history:   Most recent A1c is 12.9 done on 09/24/2020, highest 13.3 in 12/21  INSULIN regimen is:  Novolog Mix 20 units before breakfast and dinner     Non-insulin hypoglycemic drugs the patient is taking are: Invokana 300 mg daily, Ozempic 0.5 mg weekly  Current management, blood sugar patterns and problems identified:  He is not able to take his insulin regularly especially the evening insulin because he depends on his wife to do the injection, has needle phobia  Although his prescription for insulin is for 30 and 36 units he is only taking 20 units  Frequently in the evening if he is not home at dinnertime he will not take the evening shot  Also he does frequent traveling and will not take insulin usually with him  Usually takes his morning insulin fairly consistently  He has had excessive thirst and urination, sometimes will drink juices, however usually stays with diet soft drinks  Does only some walking while at work but no formal exercise except walking his dog  Nonfasting glucose today is 347  He does not check his sugars because they are persistently high  Recently also complaining of some blurred vision          Side  effects from medications have been: Diarrhea from Metformin     Typical meal intake: Breakfast is oatmeal or grits, eggs or sausage or [place]urkey croissant.  Occasionally eating fast food Has irregular mealtimes especially lunch               Exercise: not formal    Glucose monitoring:  Irregular       Glucometer:?  Contour    Blood Glucose readings not available  Dietician visit, most recent: Never  Weight history:  Wt Readings from Last 3 Encounters:  09/24/20 276 lb (125.2 kg)  04/22/18 (!) 300 lb 0.7 oz (136.1 kg)  04/03/17 300 lb (136.1 kg)    1.04 Glycemic control:   Lab Results  Component Value Date   HGBA1C 12.9 (A) 09/24/2020   HGBA1C 11.5 (H) 11/08/2016   HGBA1C 13.1 (H) 01/22/2016   Lab Results  Component Value Date   MICROALBUR 5.0 (H) 04/20/2016   LDLCALC 89 03/08/2016   CREATININE 1.10 04/03/2017   Lab Results  Component Value Date   MICRALBCREAT 5.7 04/20/2016    Lab Results  Component Value Date   FRUCTOSAMINE 253 01/19/2017   FRUCTOSAMINE 287 (H) 03/08/2016    Office Visit on 09/24/2020  Component Date Value Ref Range Status  . Hemoglobin A1C 09/24/2020 12.9* 4.0 - 5.6 % Final  .  POC Glucose 09/24/2020 347* 70 - 99 mg/dl Final    Allergies as of 09/24/2020      Reactions   Metformin And Related Diarrhea      Medication List       Accurate as of September 24, 2020  3:21 PM. If you have any questions, ask your nurse or doctor.        STOP taking these medications   amoxicillin-clavulanate 875-125 MG tablet Commonly known as: Augmentin Stopped by: Reather Littler, MD   HYDROcodone-acetaminophen 5-325 MG tablet Commonly known as: NORCO/VICODIN Stopped by: Reather Littler, MD   Victoza 18 MG/3ML Sopn Generic drug: liraglutide Stopped by: Reather Littler, MD     TAKE these medications   acetaminophen 500 MG tablet Commonly known as: TYLENOL Take 2 tablets (1,000 mg total) by mouth every 6 (six) hours as needed.   amLODipine 10 MG  tablet Commonly known as: NORVASC Take by mouth.   atorvastatin 80 MG tablet Commonly known as: LIPITOR Take by mouth.   Bayer Microlet Lancets lancets Use as instructed to check blood sugar 3 times per day dx code E11.65   canagliflozin 300 MG Tabs tablet Commonly known as: Invokana Take 1 tablet (300 mg total) by mouth daily before breakfast. What changed: Another medication with the same name was removed. Continue taking this medication, and follow the directions you see here. Changed by: Reather Littler, MD   FreeStyle Libre 14 Day Sensor Misc by Does not apply route.   glucose blood test strip Commonly known as: Banker Next Test Use as instructed to check blood sugar 3 times per day Dx code E11.65   insulin aspart protamine - aspart (70-30) 100 UNIT/ML FlexPen Commonly known as: NovoLOG Mix 70/30 FlexPen Inject 30 units in the morning, and inject 36 units in the evening   Insulin Pen Needle 31G X 5 MM Misc Use 3 per day to inject insulin   lisinopril 10 MG tablet Commonly known as: ZESTRIL Take 10 mg by mouth daily. Reported on 03/08/2016   lisinopril 40 MG tablet Commonly known as: ZESTRIL Take 40 mg by mouth daily.   metFORMIN 500 MG 24 hr tablet Commonly known as: GLUCOPHAGE-XR Take 1 tablet (500 mg total) by mouth daily with breakfast.   multivitamin capsule Take 1 capsule by mouth daily.   NovoLOG FlexPen 100 UNIT/ML FlexPen Generic drug: insulin aspart Inject into the skin 2 (two) times daily with a meal. Takes 20 units twice a day   Semaglutide (1 MG/DOSE) 2 MG/1.5ML Sopn Inject into the skin.   sertraline 100 MG tablet Commonly known as: ZOLOFT Take 100 mg by mouth daily.       Allergies:  Allergies  Allergen Reactions  . Metformin And Related Diarrhea    Past Medical History:  Diagnosis Date  . Diabetes mellitus without complication (HCC)   . Hypertension     Past Surgical History:  Procedure Laterality Date  . ANKLE SURGERY     . OPEN REDUCTION INTERNAL FIXATION (ORIF) METACARPAL Right 06/02/2016   Procedure: OPEN REDUCTION INTERNAL FIXATION (ORIF) right ring phalanx and  METACARPAL fractures;  Surgeon: Betha Loa, MD;  Location: [facility];  Service: Orthopedics;  Laterality: Right;  OPEN REDUCTION INTERNAL FIXATION (ORIF) right ring phalanx and  METACARPAL fractures  . TONSILLECTOMY      No family history on file.  Social History:  reports that he has been smoking cigarettes. He has been smoking about 0.50 packs per day. He has never  used smokeless tobacco. He reports current alcohol use. He reports current drug use. Drug: Marijuana.   Review of Systems  Constitutional: Positive for weight loss.  HENT: Negative for headaches.   Eyes: Positive for blurred vision.  Respiratory: Negative for shortness of breath.   Cardiovascular: Negative for leg swelling.       Occasional chest pain, apparently has had negative evaluation for coronary disease  Gastrointestinal: Negative for diarrhea.  Endocrine: Positive for polydipsia and erectile dysfunction.       He buys an unknown medication without prescription for erectile dysfunction with some relief  Genitourinary: Positive for nocturia.  Skin: Negative for rash.  Neurological: Positive for numbness.       Has mild pains in his lower legs but not interfering with sleep  Psychiatric/Behavioral: Positive for depressed mood, nervousness and insomnia.     Lipid history: Previously had been on atorvastatin, not clear if he is taking this now, recent labs 08/03/2020 show: Triglycerides 309, LDL 129 with HDL 48    Lab Results  Component Value Date   CHOL 162 03/08/2016   HDL 50.00 03/08/2016   LDLCALC 89 03/08/2016   TRIG 115.0 03/08/2016   CHOLHDL 3 03/08/2016           Hypertension: Has been present and is on lisinopril and amlodipine  BP Readings from Last 3 Encounters:  09/24/20 124/80  04/22/18 (!) 156/101  04/03/17 (!) 160/70     Most recent eye exam was a few years ago  Most recent foot exam: 09/2020  Currently known complications of diabetes: Neuropathy, erectile dysfunction  LABS:  Office Visit on 09/24/2020  Component Date Value Ref Range Status  . Hemoglobin A1C 09/24/2020 12.9* 4.0 - 5.6 % Final  . POC Glucose 09/24/2020 347* 70 - 99 mg/dl Final    Physical Examination:  BP 124/80   Pulse 100   Ht 6\' 6"  (1.981 m)   Wt 276 lb (125.2 kg)   SpO2 98%   BMI 31.90 kg/m   GENERAL:         Patient has mild generalized obesity.    HEENT:         Eye exam shows normal external appearance.  Fundus exam: Unable to focus adequately Oral exam deferred   NECK:   There is no lymphadenopathy  Thyroid is not enlarged and no nodules felt.   Carotids are normal to palpation and no bruit heard  LUNGS:         Chest is symmetrical. Lungs are clear to auscultation.   HEART:         Heart sounds:  S1 and S2 are normal. No murmur or click heard., no S3 or S4.    ABDOMEN:   There is no distention present. Liver and spleen are not palpable.  No other mass or tenderness present.    NEUROLOGICAL:   Ankle jerks are absent bilaterally.    Diabetic Foot Exam - Simple   Simple Foot Form Diabetic Foot exam was performed with the following findings: Yes   Visual Inspection See comments: Yes Sensation Testing See comments: Yes Pulse Check Posterior Tibialis and Dorsalis pulse intact bilaterally: Yes Comments Normal toes on the second and third toes on the left Calluses on the bottom of feet distally Monofilament sensation absent on the plantar surfaces              MUSCULOSKELETAL:  There is no swelling or deformity of the peripheral joints.     EXTREMITIES:  There is no ankle edema.  SKIN:       No rash or lesions of concern.        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 Glucose monitoring: He will start back on blood sugar monitoring Patient advised to check readings at least every other day fasting and also some readings 2 hours after meals, and to bring monitor for download on each visit  . Diabetes education: Patient will need general diabetes education especially meal planning  . Lifestyle changes: Dietary changes: Cut back on high carbohydrate and high fat meals and snacks Overall needs smaller portions and also cut back on juices Discussed that he will likely regain some weight when blood sugars are improved with increased insulin  Exercise regimen: Needs regular brisk walking for aerobic exercise  . Therapy changes: Start taking his 70/30 insulin regularly twice a day He will go up to 30 units twice daily and make sure he tries to take his insulin before breakfast and dinner If he misses the evening dose he can at least take 20 units in the evening Discussed rotation of injection sites to upper arm and outer legs as well as more lateral abdomen  Discussed  insulin pump therapy and he will likely benefit significantly from going on the OmniPod pump because of his needle phobia Discussed that otherwise he would need relatively larger dose of insulin and also at least 3 to 4 injections a day Explained how the OmniPod insulin pump works and given him a brochure, will send information to the company for benefit verification  Also need to start him on the Dexcom sensor for continuous glucose monitoring which will also likely be integrated with the pump when available. He will need to be trained by the diabetes educator for both the Dexcom and the insulin pump when available  . Preventive care needed: Booster Covid vaccine, he needs to get this as soon as possible and have recommended this . He will also need regular eye exam but can wait till his blood sugars are more consistently controlled . Assess urine microalbumin when blood sugars are better controlled   Follow-up: 30 days  Total visit time including review of previous records, labs, evaluation and management and counseling on above subject =60 minutes  Patient Instructions  INSULIN doses:  Take 30 units before breakfast daily and before dinner You will need to take the insulin with you if you are planning to eat out  If late in taking the insulin in the evening  may only take 20 units  Check blood sugars on waking up at least 3 to 4 days a week  Also check blood sugars about 2 hours after meals and do this after different meals by rotation  Recommended blood sugar levels on waking up are 90-130 and about 2 hours after meal is 130-180  Please bring your blood sugar monitor to each visit, thank you  Continue Ozempic and Invokana  Avoid all drinks with juices that are sweetened  Make sure you eat lunch midday no longer than 5 hours after the morning meal Avoid all high fat and high carbohydrate meals and snacks  You may go online to check more information about the OmniPod insulin  pump and the Dexcom continuous glucose monitoring     Consultation note has been sent to the referring physician  Reather Littler 09/24/2020, 3:21 PM   Note: This office note was prepared with Dragon voice recognition system technology. Any transcriptional errors that result from this process are unintentional.

## 2020-09-24 ENCOUNTER — Other Ambulatory Visit: Payer: Self-pay

## 2020-09-24 ENCOUNTER — Other Ambulatory Visit: Payer: Self-pay | Admitting: *Deleted

## 2020-09-24 ENCOUNTER — Ambulatory Visit (INDEPENDENT_AMBULATORY_CARE_PROVIDER_SITE_OTHER): Payer: BC Managed Care – PPO | Admitting: Endocrinology

## 2020-09-24 ENCOUNTER — Encounter: Payer: Self-pay | Admitting: Endocrinology

## 2020-09-24 VITALS — BP 124/80 | HR 100 | Ht 78.0 in | Wt 276.0 lb

## 2020-09-24 DIAGNOSIS — Z794 Long term (current) use of insulin: Secondary | ICD-10-CM

## 2020-09-24 DIAGNOSIS — E782 Mixed hyperlipidemia: Secondary | ICD-10-CM | POA: Diagnosis not present

## 2020-09-24 DIAGNOSIS — E1165 Type 2 diabetes mellitus with hyperglycemia: Secondary | ICD-10-CM

## 2020-09-24 DIAGNOSIS — I10 Essential (primary) hypertension: Secondary | ICD-10-CM | POA: Diagnosis not present

## 2020-09-24 LAB — POCT GLYCOSYLATED HEMOGLOBIN (HGB A1C): Hemoglobin A1C: 12.9 % — AB (ref 4.0–5.6)

## 2020-09-24 LAB — GLUCOSE, POCT (MANUAL RESULT ENTRY): POC Glucose: 347 mg/dl — AB (ref 70–99)

## 2020-09-24 MED ORDER — DEXCOM G6 RECEIVER DEVI: 0 refills | Status: DC

## 2020-09-24 MED ORDER — DEXCOM G6 SENSOR MISC: 2 refills | Status: DC

## 2020-09-24 MED ORDER — DEXCOM G6 TRANSMITTER MISC: 1 refills | Status: DC

## 2020-09-24 NOTE — Patient Instructions (Signed)
INSULIN doses:  Take 30 units before breakfast daily and before dinner You will need to take the insulin with you if you are planning to eat out  If late in taking the insulin in the evening may only take 20 units  Check blood sugars on waking up at least 3 to 4 days a week  Also check blood sugars about 2 hours after meals and do this after different meals by rotation  Recommended blood sugar levels on waking up are 90-130 and about 2 hours after meal is 130-180  Please bring your blood sugar monitor to each visit, thank you  Continue Ozempic and Invokana  Avoid all drinks with juices that are sweetened  Make sure you eat lunch midday no longer than 5 hours after the morning meal Avoid all high fat and high carbohydrate meals and snacks  You may go online to check more information about the OmniPod insulin pump and the Dexcom continuous glucose monitoring

## 2020-09-28 ENCOUNTER — Telehealth: Payer: Self-pay | Admitting: *Deleted

## 2020-09-29 ENCOUNTER — Other Ambulatory Visit: Payer: Self-pay | Admitting: *Deleted

## 2020-09-29 MED ORDER — FREESTYLE LIBRE 14 DAY SENSOR MISC: 3 refills | Status: DC

## 2020-09-29 MED ORDER — FREESTYLE LIBRE 2 READER DEVI: 0 refills | Status: DC

## 2020-09-29 NOTE — Telephone Encounter (Signed)
We can send prescription for freestyle libre version 2 sensors, confirm he can use his phone as reader

## 2020-09-29 NOTE — Telephone Encounter (Signed)
Rx sent 

## 2020-10-08 NOTE — Telephone Encounter (Signed)
Pt went to go pick up everything Dr. Lucianne Muss prescribed and they couldn't release to pt because they needed a PA.

## 2020-10-15 NOTE — Telephone Encounter (Signed)
Freestyle libre --needed PA. Please advise

## 2020-10-15 NOTE — Telephone Encounter (Signed)
Please do a PA  ?

## 2020-10-23 NOTE — Telephone Encounter (Signed)
Unable to start the PA due to pt does not have insurance copy on the file. LVM-to pt regarding insurance copy needed before starting the PA.

## 2020-10-26 ENCOUNTER — Ambulatory Visit: Payer: BC Managed Care – PPO | Admitting: Endocrinology

## 2020-10-29 ENCOUNTER — Ambulatory Visit: Payer: BC Managed Care – PPO | Admitting: Endocrinology

## 2020-10-29 ENCOUNTER — Encounter: Payer: Self-pay | Admitting: Endocrinology

## 2020-10-29 ENCOUNTER — Other Ambulatory Visit: Payer: Self-pay

## 2020-10-29 VITALS — BP 132/90 | HR 98 | Resp 20 | Ht 78.0 in | Wt 281.4 lb

## 2020-10-29 DIAGNOSIS — I10 Essential (primary) hypertension: Secondary | ICD-10-CM | POA: Diagnosis not present

## 2020-10-29 DIAGNOSIS — E1165 Type 2 diabetes mellitus with hyperglycemia: Secondary | ICD-10-CM | POA: Diagnosis not present

## 2020-10-29 LAB — GLUCOSE, POCT (MANUAL RESULT ENTRY): POC Glucose: 438 mg/dl — AB (ref 70–99)

## 2020-10-29 MED ORDER — OZEMPIC (0.25 OR 0.5 MG/DOSE) 2 MG/1.5ML ~~LOC~~ SOPN: 0.5000 mg | PEN_INJECTOR | SUBCUTANEOUS | 2 refills | Status: DC

## 2020-10-29 NOTE — Patient Instructions (Addendum)
Insulin 40, am and 30 at supper  Check blood sugars on waking up days a week  Also check blood sugars about 2 hours after meals and do this after different meals by rotation  Recommended blood sugar levels on waking up are 90-130 and about 2 hours after meal is 130-180  Please bring your blood sugar monitor to each visit, thank you

## 2020-10-29 NOTE — Progress Notes (Signed)
Patient ID: Lawrence MessierJerry P Fisher, male   DOB: 02/05/1971, 50 y.o.   MRN: [id number]          Reason for Appointment: Follow-up for Type 2 Diabetes  Referring PCP: Heron NayYoung, Lauren E, PA   History of Present Illness:          Date of diagnosis of type 2 diabetes mellitus:2015        Background history:   In 2015  he was having symptoms of weakness and increased thirst and was found to have glucose of about 500 in emergency room. He was initially treated with metformin 500 mg twice a day only Although his blood sugars improved significantly initially they were higher subsequently  He started Bydureon in 2016 Appears to have had persistently poor control with A1c 11% in 12/16 Also A1c in 3/21 was higher at 11.4 compared to 9.0 the previous year  Recent history:   Most recent A1c is 12.9 done on 09/24/2020, highest 13.3 in 12/21  INSULIN regimen is:  Novolog Mix 30 units before breakfast and 20 before dinner     Non-insulin hypoglycemic drugs the patient is taking are: Invokana 300 mg daily, Ozempic 0.5 mg weekly  Current management, blood sugar patterns and problems identified:  He depends on his wife to do the insulin injections, has needle phobia  Usually when he is not at home he did not take his insulin  Also for some reason has not taken any insulin since Monday  He was told to take 30 units twice a day but in the evening he is only taking 20 units when he does take it  Nonfasting glucose today is 438  Has not done any home monitoring, he was prescribed the Dexcom but this apparently is not covered and also the freestyle Josephine Igolibre is not covered  He has had less increased frequency of urination and thirst when he takes his insulin  Again also complaining of some blurred vision   He does do a little walking but not any significant exercise  Weight has come up about 5 pounds        Side effects from medications have been: Diarrhea from Metformin     Typical meal intake:  Breakfast is oatmeal or grits, eggs or sausage or [place] croissant.  Occasionally eating fast food Has irregular mealtimes especially lunch                Glucose monitoring:  Irregular       Glucometer:?  Contour    Blood Glucose readings not available  Dietician visit, most recent: Never  Weight history:  Wt Readings from Last 3 Encounters:  10/29/20 281 lb 6.4 oz (127.6 kg)  09/24/20 276 lb (125.2 kg)  04/22/18 (!) 300 lb 0.7 oz (136.1 kg)      Glycemic control:   Lab Results  Component Value Date   HGBA1C 12.9 (A) 09/24/2020   HGBA1C 11.5 (H) 11/08/2016   HGBA1C 13.1 (H) 01/22/2016   Lab Results  Component Value Date   MICROALBUR 5.0 (H) 04/20/2016   LDLCALC 89 03/08/2016   CREATININE 1.10 04/03/2017   Lab Results  Component Value Date   MICRALBCREAT 5.7 04/20/2016   Last creatinine 1.04  Lab Results  Component Value Date   FRUCTOSAMINE 253 01/19/2017   FRUCTOSAMINE 287 (H) 03/08/2016    Office Visit on 10/29/2020  Component Date Value Ref Range Status  . POC Glucose 10/29/2020 438* 70 - 99 mg/dl Final   provider aware  Allergies as of 10/29/2020      Reactions   Metformin And Related Diarrhea      Medication List       Accurate as of October 29, 2020 11:55 AM. If you have any questions, ask your nurse or doctor.        STOP taking these medications   acetaminophen 500 MG tablet Commonly known as: TYLENOL Stopped by: Reather Littler, MD   atorvastatin 80 MG tablet Commonly known as: LIPITOR Stopped by: Reather Littler, MD     TAKE these medications   amLODipine 10 MG tablet Commonly known as: NORVASC Take by mouth.   Bayer Microlet Lancets lancets Use as instructed to check blood sugar 3 times per day dx code E11.65   canagliflozin 300 MG Tabs tablet Commonly known as: Invokana Take 1 tablet (300 mg total) by mouth daily before breakfast.   Dexcom G6 Transmitter Misc Use one every 90 days   FreeStyle Libre 14 Day Sensor Misc Use one  sensor every 14 days   FreeStyle Libre 2 [employer] Use to monitor blood sugar every day   glucose blood test strip Commonly known as: Banker Next Test Use as instructed to check blood sugar 3 times per day Dx code E11.65   insulin aspart protamine - aspart (70-30) 100 UNIT/ML FlexPen Commonly known as: NovoLOG Mix 70/30 FlexPen Inject 30 units in the morning, and inject 36 units in the evening   Insulin Pen Needle 31G X 5 MM Misc Use 3 per day to inject insulin   lisinopril 10 MG tablet Commonly known as: ZESTRIL Take 10 mg by mouth daily. Reported on 03/08/2016   lisinopril 40 MG tablet Commonly known as: ZESTRIL Take 40 mg by mouth daily.   metFORMIN 500 MG 24 hr tablet Commonly known as: GLUCOPHAGE-XR Take 1 tablet (500 mg total) by mouth daily with breakfast.   multivitamin capsule Take 1 capsule by mouth daily.   NovoLOG FlexPen 100 UNIT/ML FlexPen Generic drug: insulin aspart Inject into the skin 2 (two) times daily with a meal. Takes 20 units twice a day   sertraline 100 MG tablet Commonly known as: ZOLOFT Take 100 mg by mouth daily.       Allergies:  Allergies  Allergen Reactions  . Metformin And Related Diarrhea    Past Medical History:  Diagnosis Date  . Diabetes mellitus without complication (HCC)   . Hypertension     Past Surgical History:  Procedure Laterality Date  . ANKLE SURGERY    . OPEN REDUCTION INTERNAL FIXATION (ORIF) METACARPAL Right 06/02/2016   Procedure: OPEN REDUCTION INTERNAL FIXATION (ORIF) right ring phalanx and  METACARPAL fractures;  Surgeon: Betha Loa, MD;  Location: [facility];  Service: Orthopedics;  Laterality: Right;  OPEN REDUCTION INTERNAL FIXATION (ORIF) right ring phalanx and  METACARPAL fractures  . TONSILLECTOMY      No family history on file.  Social History:  reports that he has been smoking cigarettes. He has been smoking about 0.50 packs per day. He has never used smokeless tobacco.  He reports current alcohol use. He reports current drug use. Drug: Marijuana.   Review of Systems   Lipid history: Previously had been on atorvastatin, not clear if he is taking this now, recent labs 08/03/2020 show: Triglycerides 309, LDL 129 with HDL 48    Lab Results  Component Value Date   CHOL 162 03/08/2016   HDL 50.00 03/08/2016   LDLCALC 89 03/08/2016   TRIG 115.0  03/08/2016   CHOLHDL 3 03/08/2016           Hypertension: Has been present and is on lisinopril and amlodipine, recently ran out of lisinopril  BP Readings from Last 3 Encounters:  10/29/20 132/90  09/24/20 124/80  04/22/18 (!) 156/101    Most recent eye exam was a few years ago  Most recent foot exam: 09/2020  Currently known complications of diabetes: Neuropathy, erectile dysfunction  LABS:  Office Visit on 10/29/2020  Component Date Value Ref Range Status  . POC Glucose 10/29/2020 438* 70 - 99 mg/dl Final   provider aware    Physical Examination:  BP 132/90 (BP Location: Left Arm)   Pulse 98   Resp 20   Ht 6\' 6"  (1.981 m)   Wt 281 lb 6.4 oz (127.6 kg)   SpO2 98%   BMI 32.52 kg/m          ASSESSMENT:  Diabetes type 2  See history of present illness for detailed discussion of current diabetes management, blood sugar patterns and problems identified  Most recent A1c is 12.9  Current treatment regimen is 70/30 insulin, Invokana and Ozempic  His blood sugars are more difficult to control with progressive hyperglycemia for at least a year Unlikely that he has had any consistent control for several years He needs significant amount of insulin increase and currently he is frequently forgetting his evening insulin dose or is reluctant to take it on time 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 Glucose monitoring: He will pick up the freestyle libre that was prescribed, co-pay card also given. Discussed that he can ask for help with starting this and reminded him to make sure he checks his blood sugars by scanning every 8 hours at least Discussed blood sugar targets at mealtimes and after eating  . Diabetes education: Patient will need general diabetes education and needs comprehensive information including insulin administration and possible use of insulin pumps  . Lifestyle changes: Dietary changes: He will continue to improve his diet with healthier meals and cutting back on juices   . Therapy changes: Increase the 70/30 insulin to 40 units in the morning and 30 in the evening He will need to make sure he takes his insulin consistently We will fax the information to the [employer] company to authorize the pump supplies from his insurance Again discussed in detail how the insulin pump will work and not he will need to make sure he boluses for meals as well as change the pump every 3 days Further adjustment will be done based on his blood sugar patterns To call if he has any tendency to hypoglycemia    Patient Instructions  Insulin 40, am and 30 at supper  Check blood sugars on waking up days a week  Also check blood sugars about 2 hours after meals and do this after different meals by rotation  Recommended blood sugar levels on waking up are 90-130 and about 2 hours after meal is 130-180  Please bring your blood sugar monitor to each visit, thank you        Reather Littler 10/29/2020, 11:55 AM   Note: This office note was prepared with Dragon voice  recognition system technology. Any transcriptional errors that result from this process are unintentional.

## 2020-10-29 NOTE — Telephone Encounter (Signed)
LVM--to pt wife receive the insurance through pt MYCHART-due to pt insurance DOB is different and unable to process the PA for DEXCOM 6. Pt wife did mention todays appt. Pt employee made mistake with the DOB on the insurance.

## 2020-10-30 ENCOUNTER — Other Ambulatory Visit: Payer: Self-pay | Admitting: *Deleted

## 2020-10-30 DIAGNOSIS — E1165 Type 2 diabetes mellitus with hyperglycemia: Secondary | ICD-10-CM

## 2020-10-30 MED ORDER — DEXCOM G6 SENSOR MISC: 3 refills | Status: DC

## 2020-10-30 MED ORDER — DEXCOM G6 RECEIVER DEVI: 0 refills | Status: DC

## 2020-10-30 MED ORDER — DEXCOM G6 TRANSMITTER MISC: 3 refills | Status: AC

## 2020-10-30 NOTE — Telephone Encounter (Signed)
Called insurance directly --stated no needed PA as long sent to DME. Rx sent to [facility].

## 2020-11-12 ENCOUNTER — Telehealth: Payer: Self-pay | Admitting: Endocrinology

## 2020-11-12 NOTE — Telephone Encounter (Signed)
Pt's wife called, they are unable to get pt a refill on his Ozempic until a PA is sent to his insurance, [employer]. She asks we start the process for this.  Once it has been approved he would like the refill sent to: [facility] [address] [place], [place] - [address] Phone:  [phone]  Fax:  [phone]

## 2020-11-13 NOTE — Telephone Encounter (Signed)
Received fax PA forms- placed in Dr. Remus Blake box

## 2020-11-13 NOTE — Telephone Encounter (Signed)
Tried to attempt PA and there is a problem. Will have to contact [employer] [place]

## 2020-11-16 NOTE — Telephone Encounter (Signed)
Notified pt wife-DOB on the pt insurance is not correct and cannot go forward PA for Rx ozempic--cannot go forward  Rx Ozempic PA. Pt wife is aware to this problem and  will call pt employer regarding insurance problem.

## 2020-12-01 ENCOUNTER — Ambulatory Visit: Payer: BC Managed Care – PPO | Admitting: Nutrition

## 2020-12-11 ENCOUNTER — Other Ambulatory Visit: Payer: BC Managed Care – PPO

## 2020-12-15 ENCOUNTER — Other Ambulatory Visit: Payer: Self-pay

## 2020-12-15 ENCOUNTER — Other Ambulatory Visit (INDEPENDENT_AMBULATORY_CARE_PROVIDER_SITE_OTHER): Payer: BC Managed Care – PPO

## 2020-12-15 DIAGNOSIS — E1165 Type 2 diabetes mellitus with hyperglycemia: Secondary | ICD-10-CM | POA: Diagnosis not present

## 2020-12-15 LAB — BASIC METABOLIC PANEL
BUN: 15 mg/dL (ref 6–23)
CO2: 28 mEq/L (ref 19–32)
Calcium: 9.8 mg/dL (ref 8.4–10.5)
Chloride: 101 mEq/L (ref 96–112)
Creatinine, Ser: 1.03 mg/dL (ref 0.40–1.50)
GFR: 85.71 mL/min (ref >60.00)
Glucose, Bld: 213 mg/dL — ABNORMAL HIGH (ref 70–99)
Potassium: 4.2 mEq/L (ref 3.5–5.1)
Sodium: 137 mEq/L (ref 135–145)

## 2020-12-15 LAB — MICROALBUMIN / CREATININE URINE RATIO
Creatinine,U: 49.5 mg/dL
Microalb Creat Ratio: 30.5 mg/g — ABNORMAL HIGH (ref 0.0–30.0)
Microalb, Ur: 15.1 mg/dL — ABNORMAL HIGH (ref 0.0–1.9)

## 2020-12-16 LAB — FRUCTOSAMINE: Fructosamine: 389 umol/L — ABNORMAL HIGH (ref 0–285)

## 2020-12-17 ENCOUNTER — Ambulatory Visit: Payer: BC Managed Care – PPO | Admitting: Endocrinology

## 2020-12-31 ENCOUNTER — Other Ambulatory Visit: Payer: Self-pay

## 2020-12-31 ENCOUNTER — Encounter: Payer: Self-pay | Admitting: Endocrinology

## 2020-12-31 ENCOUNTER — Telehealth: Payer: Self-pay | Admitting: Endocrinology

## 2020-12-31 ENCOUNTER — Ambulatory Visit: Payer: BC Managed Care – PPO | Admitting: Endocrinology

## 2020-12-31 VITALS — BP 128/86 | HR 103 | Ht 78.0 in | Wt 280.8 lb

## 2020-12-31 DIAGNOSIS — I10 Essential (primary) hypertension: Secondary | ICD-10-CM | POA: Diagnosis not present

## 2020-12-31 DIAGNOSIS — E1165 Type 2 diabetes mellitus with hyperglycemia: Secondary | ICD-10-CM

## 2020-12-31 LAB — POCT GLYCOSYLATED HEMOGLOBIN (HGB A1C): Hemoglobin A1C: 11.8 % — AB (ref 4.0–5.6)

## 2020-12-31 MED ORDER — INSULIN PEN NEEDLE 31G X 5 MM MISC: 3 refills | Status: DC

## 2020-12-31 MED ORDER — FREESTYLE LIBRE 2 SENSOR MISC: 2.0000 | 3 refills | Status: DC

## 2020-12-31 MED ORDER — NOVOLOG MIX 70/30 FLEXPEN (70-30) 100 UNIT/ML ~~LOC~~ SUPN: PEN_INJECTOR | SUBCUTANEOUS | 11 refills | Status: DC

## 2020-12-31 MED ORDER — GLUCOSE BLOOD VI STRP: ORAL_STRIP | 3 refills | Status: DC

## 2020-12-31 MED ORDER — OZEMPIC (0.25 OR 0.5 MG/DOSE) 2 MG/1.5ML ~~LOC~~ SOPN: 0.5000 mg | PEN_INJECTOR | SUBCUTANEOUS | 2 refills | Status: DC

## 2020-12-31 NOTE — Telephone Encounter (Signed)
Detailed message left on patient's voicemail.

## 2020-12-31 NOTE — Progress Notes (Signed)
Patient ID: Lawrence Fisher, male   DOB: 1971-08-15, 50 y.o.   MRN: [id number]          Reason for Appointment: Follow-up for Type 2 Diabetes    History of Present Illness:          Date of diagnosis of type 2 diabetes mellitus:2015        Background history:   In 2015  he was having symptoms of weakness and increased thirst and was found to have glucose of about 500 in emergency room. He was initially treated with metformin 500 mg twice a day only Although his blood sugars improved significantly initially they were higher subsequently  He started Bydureon in 2016 Appears to have had persistently poor control with A1c 11% in 12/16 Also A1c in 3/21 was higher at 11.4 compared to 9.0 the previous year  Recent history:   His A1c is now 11.8, previously highest 13.3 in 12/21  INSULIN regimen is:  Novolog Mix 30 units before breakfast and 30 before dinner     Non-insulin hypoglycemic drugs the patient is taking are: Invokana 300 mg daily, not on Ozempic 0.5 mg weekly  Current management, blood sugar patterns and problems identified:  He depends on his wife to do the insulin injections, has needle phobia  Again he has been missing insulin doses frequently because his wife is not at home to do his injections at mealtime  He is likely taking his evening insulin only about half the time and morning insulin only about 5 days a week  Also unclear whether he is taking NovoLog or NovoLog mix insulin and both are on his list  He is also not able to check his blood sugars on his own and has only sporadic readings  His wife said that sometimes he will eat all night long and his eating habits are very regular  He is frequently not eating consistent meals because he has tendency to diarrhea after eating  He has not had any coverage for Ozempic and has not taken this  Also unable to get coverage for his CGM supplies which were ordered,  No hypoglycemic symptoms with current insulin  regimen        Side effects from medications have been: Diarrhea from Metformin     Typical meal intake: Breakfast is oatmeal or grits, eggs or sausage or [place] croissant.  Occasionally eating fast food Has irregular mealtimes especially lunch               Glucose monitoring:  Irregular       Glucometer:  Contour    Blood Glucose readings has only  Dietician visit, most recent: Never  Weight history:  Wt Readings from Last 3 Encounters:  12/31/20 280 lb 12.8 oz (127.4 kg)  10/29/20 281 lb 6.4 oz (127.6 kg)  09/24/20 276 lb (125.2 kg)      Glycemic control:   Lab Results  Component Value Date   HGBA1C 11.8 (A) 12/31/2020   HGBA1C 12.9 (A) 09/24/2020   HGBA1C 11.5 (H) 11/08/2016   Lab Results  Component Value Date   MICROALBUR 15.1 (H) 12/15/2020   LDLCALC 89 03/08/2016   CREATININE 1.03 12/15/2020   Lab Results  Component Value Date   MICRALBCREAT 30.5 (H) 12/15/2020   Last creatinine 1.04  Lab Results  Component Value Date   FRUCTOSAMINE 389 (H) 12/15/2020   FRUCTOSAMINE 253 01/19/2017   FRUCTOSAMINE 287 (H) 03/08/2016    Office Visit on 12/31/2020  Component  Date Value Ref Range Status  . Hemoglobin A1C 12/31/2020 11.8* 4.0 - 5.6 % Final    Allergies as of 12/31/2020      Reactions   Metformin And Related Diarrhea      Medication List       Accurate as of Dec 31, 2020 10:59 AM. If you have any questions, ask your nurse or doctor.        amLODipine 10 MG tablet Commonly known as: NORVASC Take by mouth.   Bayer Microlet Lancets lancets Use as instructed to check blood sugar 3 times per day dx code E11.65   canagliflozin 300 MG Tabs tablet Commonly known as: Invokana Take 1 tablet (300 mg total) by mouth daily before breakfast.   Dexcom G6 Transmitter Misc Use one every 90 days   FreeStyle Libre 14 Day Sensor Misc Use one sensor every 14 days   Dexcom G6 Sensor Misc Use as instructions change sensor every 10 days.   FreeStyle  Libre 2 Reader Hardie Pulley Use to monitor blood sugar every day   Dexcom G6 Receiver Free Soil Use as directed to check blood sugar.   glucose blood test strip Commonly known as: Banker Next Test Use as instructed to check blood sugar 3 times per day Dx code E11.65   insulin aspart protamine - aspart (70-30) 100 UNIT/ML FlexPen Commonly known as: NovoLOG Mix 70/30 FlexPen Inject 30 units in the morning, and inject 36 units in the evening   Insulin Pen Needle 31G X 5 MM Misc Use 3 per day to inject insulin   lisinopril 10 MG tablet Commonly known as: ZESTRIL Take 10 mg by mouth daily. Reported on 03/08/2016   lisinopril 40 MG tablet Commonly known as: ZESTRIL Take 40 mg by mouth daily.   metFORMIN 500 MG 24 hr tablet Commonly known as: GLUCOPHAGE-XR Take 1 tablet (500 mg total) by mouth daily with breakfast.   multivitamin capsule Take 1 capsule by mouth daily.   NovoLOG FlexPen 100 UNIT/ML FlexPen Generic drug: insulin aspart Inject into the skin 2 (two) times daily with a meal. Takes 30 units twice a day   Ozempic (0.25 or 0.5 MG/DOSE) 2 MG/1.5ML Sopn Generic drug: Semaglutide(0.25 or 0.5MG /DOS) Inject 0.5 mg into the skin once a week.   sertraline 100 MG tablet Commonly known as: ZOLOFT Take 100 mg by mouth daily.       Allergies:  Allergies  Allergen Reactions  . Metformin And Related Diarrhea    Past Medical History:  Diagnosis Date  . Diabetes mellitus without complication (HCC)   . Hypertension     Past Surgical History:  Procedure Laterality Date  . ANKLE SURGERY    . OPEN REDUCTION INTERNAL FIXATION (ORIF) METACARPAL Right 06/02/2016   Procedure: OPEN REDUCTION INTERNAL FIXATION (ORIF) right ring phalanx and  METACARPAL fractures;  Surgeon: Betha Loa, MD;  Location: [facility];  Service: Orthopedics;  Laterality: Right;  OPEN REDUCTION INTERNAL FIXATION (ORIF) right ring phalanx and  METACARPAL fractures  . TONSILLECTOMY      No  family history on file.  Social History:  reports that he has been smoking cigarettes. He has been smoking about 0.50 packs per day. He has never used smokeless tobacco. He reports current alcohol use. He reports current drug use. Drug: Marijuana.   Review of Systems   Lipid history: Previously had been on atorvastatin from his PCP Last labs from PCP in 08/03/2020 show: Triglycerides 309, LDL 129 with HDL 48  Lab Results  Component Value Date   CHOL 162 03/08/2016   HDL 50.00 03/08/2016   LDLCALC 89 03/08/2016   TRIG 115.0 03/08/2016   CHOLHDL 3 03/08/2016           Hypertension: Has been present and is on lisinopril and amlodipine, not clear if he is taking these regularly  BP Readings from Last 3 Encounters:  12/31/20 128/86  10/29/20 132/90  09/24/20 124/80    Most recent eye exam was a few years ago  Most recent foot exam: 09/2020  Currently known complications of diabetes: Neuropathy, erectile dysfunction  Has persistent chronic diarrhea ? IBS  LABS:  Office Visit on 12/31/2020  Component Date Value Ref Range Status  . Hemoglobin A1C 12/31/2020 11.8* 4.0 - 5.6 % Final    Physical Examination:  BP 128/86   Pulse (!) 103   Ht 6\' 6"  (1.981 m)   Wt 280 lb 12.8 oz (127.4 kg)   SpO2 96%   BMI 32.45 kg/m          ASSESSMENT:  Diabetes type 2  See history of present illness for detailed discussion of current diabetes management, blood sugar patterns and problems identified  Most recent A1c is 11.8  Current treatment regimen is 70/30 insulin, Invokana and Ozempic  He is not able to take his insulin consistently Also unclear whether he is taking the proper NovoLog insulin Unable to verify his coverage for the insulin pump for the CGM since he has some issues with his insurance Blood sugars appear to be persistently high especially fasting His diet has been quite irregular also   PLAN:    . Glucose monitoring: He will use the freestyle libre  that was prescribed today If he needs help with starting this he will call.  Reassured him that this sensor insertion will not be painful and he should be able to keep it on for 2 weeks and use his smart phone for reading the numbers He will need to check 4 times a day   . Diabetes education: Previously scheduled once he is able to get approval for the freestyle libre and possibly an insulin pump   . Therapy changes: We will need to verify which insulin is taking He will take 40 units twice a day but also once he is able to restart his Ozempic he will start cutting back to 30 units twice daily Discussed timing of insulin to help with postprandial control Ideally he should be on basal and mealtime insulin separately but compliance may be difficult with this especially mealtime insulin Discussed importance of taking insulin before meals His wife is also present and she will try to assist him with compliance Need to check blood sugars twice a day regularly  Also recommend that he follow-up with his PCP regarding management of the diarrhea   Reminded him to take his lisinopril regularly for hypertension  Patient Instructions  Insulin 40 units twice daily and down to 30 on Ozempic  Total visit time including counseling = 30 minutes    12/31/2020, 10:59 AM   Note: This office note was prepared with Dragon voice recognition system technology. Any transcriptional errors that result from this process are unintentional.

## 2020-12-31 NOTE — Telephone Encounter (Signed)
Please find out from the patient if he is taking the clear NovoLog or the cloudy 70/30.  He needs to be taking the 70/30 insulin which has been sent to his pharmacy

## 2020-12-31 NOTE — Patient Instructions (Addendum)
Insulin 40 units twice daily and down to 30 on Ozempic  Start OZEMPIC injections by dialing 0.25 mg on the pen as shown once weekly on the same day of the week.   You may inject in the sides of the stomach, outer thigh or arm as indicated in the brochure given. If you have any difficulties using the pen see the video at [email or web address]  You will feel fullness of the stomach with starting the medication and should try to keep the portions at meals small.  You may experience nausea in the first few days which usually gets better over time    After 3-4 weeks increase the dose to 0.5 mg weekly  If you have any questions or persistent side effects please call the office   You may also talk to a nurse educator with [employer] at [phone] Useful website: [email or web address]

## 2021-02-11 ENCOUNTER — Telehealth: Payer: Self-pay | Admitting: Pharmacy Technician

## 2021-02-11 NOTE — Telephone Encounter (Addendum)
Patient Advocate Encounter   Received notification from [facility] that prior authorization for [facility] is required.   PA submitted on 02/11/2021  INFORMATION SUBMITTED TO [email or web address] AND OFFICE NOTES/LABS/DISCONTINUED MEDICATIONS FAXED.  Status is pending and can take 7-10 business days.    [facility] will continue to follow.   Netty Starring. Dimas Aguas, CPhT Patient Advocate  [facility] Clinic Phone: [phone] Fax:  [phone]

## 2021-02-14 ENCOUNTER — Emergency Department (HOSPITAL_BASED_OUTPATIENT_CLINIC_OR_DEPARTMENT_OTHER): Payer: BC Managed Care – PPO

## 2021-02-14 ENCOUNTER — Other Ambulatory Visit: Payer: Self-pay

## 2021-02-14 ENCOUNTER — Emergency Department (HOSPITAL_BASED_OUTPATIENT_CLINIC_OR_DEPARTMENT_OTHER)
Admission: EM | Admit: 2021-02-14 | Discharge: 2021-02-14 | Disposition: A | Discharge status: Home / Self Care | Payer: BC Managed Care – PPO | Attending: Emergency Medicine | Admitting: Emergency Medicine

## 2021-02-14 ENCOUNTER — Encounter (HOSPITAL_BASED_OUTPATIENT_CLINIC_OR_DEPARTMENT_OTHER): Payer: Self-pay | Admitting: *Deleted

## 2021-02-14 DIAGNOSIS — E119 Type 2 diabetes mellitus without complications: Secondary | ICD-10-CM | POA: Insufficient documentation

## 2021-02-14 DIAGNOSIS — F17210 Nicotine dependence, cigarettes, uncomplicated: Secondary | ICD-10-CM | POA: Insufficient documentation

## 2021-02-14 DIAGNOSIS — Z794 Long term (current) use of insulin: Secondary | ICD-10-CM | POA: Insufficient documentation

## 2021-02-14 DIAGNOSIS — Z7984 Long term (current) use of oral hypoglycemic drugs: Secondary | ICD-10-CM | POA: Diagnosis not present

## 2021-02-14 DIAGNOSIS — I10 Essential (primary) hypertension: Secondary | ICD-10-CM | POA: Diagnosis not present

## 2021-02-14 DIAGNOSIS — S4992XA Unspecified injury of left shoulder and upper arm, initial encounter: Secondary | ICD-10-CM | POA: Diagnosis present

## 2021-02-14 DIAGNOSIS — Z79899 Other long term (current) drug therapy: Secondary | ICD-10-CM | POA: Insufficient documentation

## 2021-02-14 DIAGNOSIS — S43015A Anterior dislocation of left humerus, initial encounter: Secondary | ICD-10-CM

## 2021-02-14 MED ORDER — FENTANYL CITRATE (PF) 100 MCG/2ML IJ SOLN
100.0000 ug | Freq: Once | INTRAMUSCULAR | Status: AC
  Administered 2021-02-14: 100 ug via INTRAVENOUS
  Filled 2021-02-14: qty 2

## 2021-02-14 MED ORDER — LACTATED RINGERS IV SOLN: INTRAVENOUS | Status: DC

## 2021-02-14 MED ORDER — PROPOFOL 10 MG/ML IV BOLUS
0.5000 mg/kg | Freq: Once | INTRAVENOUS | Status: AC
  Administered 2021-02-14: 61.3 mg via INTRAVENOUS
  Filled 2021-02-14: qty 20

## 2021-02-14 MED ORDER — HYDROCODONE-ACETAMINOPHEN 5-325 MG PO TABS: 1.0000 | ORAL_TABLET | ORAL | 0 refills | Status: DC | PRN
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
## 2021-02-14 NOTE — Sedation Documentation (Signed)
Reduction complete. XR notified of need for post reduction films.
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
## 2021-02-14 NOTE — ED Notes (Signed)
Pt to Xray.

## 2021-02-14 NOTE — Discharge Instructions (Addendum)
Wear the sling until you see the orthopedic doctor.  Apply ice for 30 minutes at a time, 4 times a day.  You may take ibuprofen and/or acetaminophen as needed for less severe pain.

## 2021-02-19 ENCOUNTER — Other Ambulatory Visit: Payer: Self-pay

## 2021-02-19 ENCOUNTER — Ambulatory Visit (INDEPENDENT_AMBULATORY_CARE_PROVIDER_SITE_OTHER): Payer: BC Managed Care – PPO | Admitting: Endocrinology

## 2021-02-19 ENCOUNTER — Encounter: Payer: Self-pay | Admitting: Endocrinology

## 2021-02-19 VITALS — BP 162/102 | HR 95 | Ht 78.0 in | Wt 286.2 lb

## 2021-02-19 DIAGNOSIS — Z794 Long term (current) use of insulin: Secondary | ICD-10-CM | POA: Diagnosis not present

## 2021-02-19 DIAGNOSIS — E1165 Type 2 diabetes mellitus with hyperglycemia: Secondary | ICD-10-CM

## 2021-02-19 MED ORDER — TRULICITY 0.75 MG/0.5ML ~~LOC~~ SOAJ: SUBCUTANEOUS | 0 refills | Status: DC

## 2021-02-19 NOTE — Progress Notes (Signed)
Patient ID: Lawrence Fisher, male   DOB: [birth year], 50 y.o.   MRN: [id number]          Reason for Appointment: Follow-up for Type 2 Diabetes    History of Present Illness:          Date of diagnosis of type 2 diabetes mellitus:2015        Background history:   In 2015  he was having symptoms of weakness and increased thirst and was found to have glucose of about 500 in emergency room. He was initially treated with metformin 500 mg twice a day only Although his blood sugars improved significantly initially they were higher subsequently  He started Bydureon in 2016 Appears to have had persistently poor control with A1c 11% in 12/16 Also A1c in 3/21 was higher at 11.4 compared to 9.0 the previous year  Recent history:   His A1c is last 11.8, previously highest 13.3 in 12/21  INSULIN regimen is:  Novolog Mix 30 units before breakfast and 30 before dinner     Non-insulin hypoglycemic drugs the patient is taking are: Invokana 300 mg daily, not on Ozempic 0.5 mg weekly  Current management, blood sugar patterns and problems identified: He depends on his wife to do the insulin injections, has needle phobia Again he has not been taking the prescribed doses of insulin, was told to go up to 40 units twice a day on the last visit and written instructions given  He is reporting taking his morning and evening insulin doses more regularly and not forgetting as before He confirms that he is taking the cloudy insulin and not the clear NovoLog Although he thinks he is checking his sugars before breakfast and dinnertime he did not bring his meter Blood sugars are mostly in the upper 100s and occasionally over 200 such as today Sometimes he will eat meals at unusual times, last night had a meal at 2 AM He has not had any coverage for Ozempic possibly because of lack of coverage for this product Again unable to get coverage for his CGM supplies which were ordered, No hypoglycemic symptoms with current  insulin regimen        Side effects from medications have been: Diarrhea from Metformin     Typical meal intake: Breakfast is oatmeal or grits, eggs or sausage or [place] croissant.  Occasionally eating fast food Has irregular mealtimes especially lunch               Glucose monitoring:  Irregular       Glucometer:  Contour    Blood Glucose readings as above  Dietician visit, most recent: Never  Weight history:  Wt Readings from Last 3 Encounters:  02/19/21 286 lb 3.2 oz (129.8 kg)  02/14/21 270 lb (122.5 kg)  12/31/20 280 lb 12.8 oz (127.4 kg)      Glycemic control:   Lab Results  Component Value Date   HGBA1C 11.8 (A) 12/31/2020   HGBA1C 12.9 (A) 09/24/2020   HGBA1C 11.5 (H) 11/08/2016   Lab Results  Component Value Date   MICROALBUR 15.1 (H) 12/15/2020   LDLCALC 89 03/08/2016   CREATININE 1.03 12/15/2020   Lab Results  Component Value Date   MICRALBCREAT 30.5 (H) 12/15/2020   Last creatinine 1.04  Lab Results  Component Value Date   FRUCTOSAMINE 389 (H) 12/15/2020   FRUCTOSAMINE 253 01/19/2017   FRUCTOSAMINE 287 (H) 03/08/2016    No visits with results within 1 Week(s) from this visit.  Latest known visit with results is:  Office Visit on 12/31/2020  Component Date Value Ref Range Status   Hemoglobin A1C 12/31/2020 11.8 (A) 4.0 - 5.6 % Final    Allergies as of 02/19/2021       Reactions   Metformin And Related Diarrhea        Medication List        Accurate as of February 19, 2021 10:09 AM. If you have any questions, ask your nurse or doctor.          STOP taking these medications    Ozempic (0.25 or 0.5 MG/DOSE) 2 MG/1.5ML Sopn Generic drug: Semaglutide(0.25 or 0.5MG /DOS) Stopped by: Reather Littler, MD       TAKE these medications    amLODipine 10 MG tablet Commonly known as: NORVASC Take by mouth.   Bayer Microlet Lancets lancets Use as instructed to check blood sugar 3 times per day dx code E11.65   canagliflozin 300 MG Tabs  tablet Commonly known as: Invokana Take 1 tablet (300 mg total) by mouth daily before breakfast.   Dexcom G6 Transmitter Misc Use one every 90 days   FreeStyle Libre 2 Sensor Misc 2 Devices by Does not apply route every 14 (fourteen) days.   glucose blood test strip Commonly known as: Banker Next Test Use as instructed to check blood sugar 3 times per day Dx code E11.65   HYDROcodone-acetaminophen 5-325 MG tablet Commonly known as: Norco Take 1 tablet by mouth every 4 (four) hours as needed for moderate pain.   Insulin Pen Needle 31G X 5 MM Misc Use 3 per day to inject insulin   lisinopril 10 MG tablet Commonly known as: ZESTRIL Take 10 mg by mouth daily. Reported on 03/08/2016   lisinopril 40 MG tablet Commonly known as: ZESTRIL Take 40 mg by mouth daily.   metFORMIN 500 MG 24 hr tablet Commonly known as: GLUCOPHAGE-XR Take 1 tablet (500 mg total) by mouth daily with breakfast.   multivitamin capsule Take 1 capsule by mouth daily.   NovoLOG FlexPen 100 UNIT/ML FlexPen Generic drug: insulin aspart Inject into the skin 2 (two) times daily with a meal. Takes 30 units twice a day   NovoLOG Mix 70/30 FlexPen (70-30) 100 UNIT/ML FlexPen Generic drug: insulin aspart protamine - aspart Inject 40 units before breakfast and 40 units before supper   sertraline 100 MG tablet Commonly known as: ZOLOFT Take 100 mg by mouth daily.   Trulicity 0.75 MG/0.5ML Sopn Generic drug: Dulaglutide Inject in the abdominal skin as directed once a week Started by: Reather Littler, MD        Allergies:  Allergies  Allergen Reactions   Metformin And Related Diarrhea    Past Medical History:  Diagnosis Date   Diabetes mellitus without complication (HCC)    Hypertension     Past Surgical History:  Procedure Laterality Date   ANKLE SURGERY     CHOLECYSTECTOMY     OPEN REDUCTION INTERNAL FIXATION (ORIF) METACARPAL Right 06/02/2016   Procedure: OPEN REDUCTION INTERNAL FIXATION  (ORIF) right ring phalanx and  METACARPAL fractures;  Surgeon: Betha Loa, MD;  Location: [facility];  Service: Orthopedics;  Laterality: Right;  OPEN REDUCTION INTERNAL FIXATION (ORIF) right ring phalanx and  METACARPAL fractures   TONSILLECTOMY      No family history on file.  Social History:  reports that he has been smoking cigarettes. He has been smoking an average of 0.50 packs per day. He has never used  smokeless tobacco. He reports current alcohol use. He reports current drug use. Drug: Marijuana.   Review of Systems   Lipid history: Previously had been on atorvastatin from his PCP Last labs from PCP in 08/03/2020 show: Triglycerides 309, LDL 129 with HDL 48    Lab Results  Component Value Date   CHOL 162 03/08/2016   HDL 50.00 03/08/2016   LDLCALC 89 03/08/2016   TRIG 115.0 03/08/2016   CHOLHDL 3 03/08/2016           Hypertension: Has been present and is on lisinopril and amlodipine from his PCP  BP Readings from Last 3 Encounters:  02/19/21 (!) 162/102  02/14/21 (!) 149/86  12/31/20 128/86    Most recent eye exam was a few years ago  Most recent foot exam: 09/2020  Currently known complications of diabetes: Neuropathy, erectile dysfunction  Has persistent chronic diarrhea ? IBS  LABS:  No visits with results within 1 Week(s) from this visit.  Latest known visit with results is:  Office Visit on 12/31/2020  Component Date Value Ref Range Status   Hemoglobin A1C 12/31/2020 11.8 (A) 4.0 - 5.6 % Final    Physical Examination:  BP (!) 162/102   Pulse 95   Ht 6\' 6"  (1.981 m)   Wt 286 lb 3.2 oz (129.8 kg)   SpO2 99%   BMI 33.07 kg/m          ASSESSMENT:  Diabetes type 2  See history of present illness for detailed discussion of current diabetes management, blood sugar patterns and problems identified  Most recent A1c is 11.8  Current treatment regimen is 70/30 insulin, Invokana but not on Ozempic  He is reportedly able to  take his insulin consistently He thinks his blood sugars are in the upper 100 range now, labs not available However he did not increase the dose as directed  He is for some reason not able to get coverage for even the freestyle libre or may have a high deductible Reports that he has been told that the Ozempic is not covered by his insurance   PLAN:     Therapy changes:  He will take 40 units twice a day of the Novolog Mix Instead of Ozempic we will try him on Trulicity as this may be better covered, he will start with 0.75 mg weekly and call when finished to go up to the higher dose Emphasized the need to cut back on juices and regular soft drinks which she is apparently consuming regularly  Need to check blood sugars twice a day but also alternating before and after meal readings especially in the evening  Also recommend that he follow-up with his PCP regarding management of the hypertension and adjustment of his medications, needs to make sure he is getting all his prescriptions consistently    Patient Instructions  Check blood sugars on waking up 3-4 days a week  Also check blood sugars about 2 hours after meals and do this after different meals by rotation  Recommended blood sugar levels on waking up are 90-130 and about 2 hours after meal is 130-160  Please bring your blood sugar monitor to each visit, thank you  Insulin 40 units before meals; drop to 35 when on Trulicity  Trulicity 0.75mg   weekly for 4 shots and then call for for 1.5mg  dose  No juices or reg drinks,       02/19/2021, 10:09 AM   Note: This office note was prepared with  [employer] therapist. Any transcriptional errors that result from this process are unintentional.

## 2021-02-19 NOTE — Patient Instructions (Addendum)
Check blood sugars on waking up 3-4 days a week  Also check blood sugars about 2 hours after meals and do this after different meals by rotation  Recommended blood sugar levels on waking up are 90-130 and about 2 hours after meal is 130-160  Please bring your blood sugar monitor to each visit, thank you  Insulin 40 units before meals; drop to 35 when on Trulicity  Trulicity 0.75mg   weekly for 4 shots and then call for for 1.5mg  dose  No juices or reg drinks,

## 2021-02-25 NOTE — Telephone Encounter (Signed)
Called plan to check status of request.    Still no decision.    Gabby, representative on phone, put in an expetited request since they have gone over their usual 7-10 business days.  If we change to Trulicity, the process will have to start over.  Netty Starring. Dimas Aguas, CPhT Patient Advocate [facility] Phone: [phone] Fax:  [phone]

## 2021-03-01 ENCOUNTER — Other Ambulatory Visit: Payer: Self-pay | Admitting: Endocrinology

## 2021-03-01 MED ORDER — OZEMPIC (0.25 OR 0.5 MG/DOSE) 2 MG/1.5ML ~~LOC~~ SOPN: 0.5000 mg | PEN_INJECTOR | SUBCUTANEOUS | 0 refills | Status: DC

## 2021-03-01 NOTE — Telephone Encounter (Signed)
[facility] Patient Advocate Encounter  Prior Authorization for [facility] has been approved.    PA# [id number] Effective dates: 02/26/2021 through 08/21/2028     [facility] will continue to follow.   Netty Starring. Dimas Aguas, CPhT Patient Advocate [facility] Phone: [phone] Fax:  [phone]

## 2021-03-26 ENCOUNTER — Other Ambulatory Visit: Payer: Self-pay

## 2021-03-26 ENCOUNTER — Other Ambulatory Visit (INDEPENDENT_AMBULATORY_CARE_PROVIDER_SITE_OTHER): Payer: BC Managed Care – PPO

## 2021-03-26 DIAGNOSIS — Z794 Long term (current) use of insulin: Secondary | ICD-10-CM

## 2021-03-26 DIAGNOSIS — E1165 Type 2 diabetes mellitus with hyperglycemia: Secondary | ICD-10-CM | POA: Diagnosis not present

## 2021-03-26 LAB — BASIC METABOLIC PANEL
BUN: 15 mg/dL (ref 6–23)
CO2: 29 mEq/L (ref 19–32)
Calcium: 9.9 mg/dL (ref 8.4–10.5)
Chloride: 100 mEq/L (ref 96–112)
Creatinine, Ser: 1.09 mg/dL (ref 0.40–1.50)
GFR: 79.92 mL/min (ref >60.00)
Glucose, Bld: 233 mg/dL — ABNORMAL HIGH (ref 70–99)
Potassium: 4.3 mEq/L (ref 3.5–5.1)
Sodium: 137 mEq/L (ref 135–145)

## 2021-03-26 LAB — HEMOGLOBIN A1C: Hgb A1c MFr Bld: 9.7 % — ABNORMAL HIGH (ref 4.6–6.5)

## 2021-04-02 ENCOUNTER — Encounter: Payer: BC Managed Care – PPO | Admitting: Endocrinology

## 2021-04-02 NOTE — Progress Notes (Signed)
This encounter was created in error - please disregard.

## 2021-04-06 ENCOUNTER — Telehealth: Payer: Self-pay | Admitting: Endocrinology

## 2021-04-06 NOTE — Telephone Encounter (Signed)
Pt's wife calling in stating that he had a lab app on 03/26/2021. Pt had missed his app on 04/02/2021. Pt rescheduled on 04/23/2021 for 6 week follow up and pt and pt's wife is wondering  if labs need to be redone. If so pt would like a phone call back to schedule labs.

## 2021-04-07 NOTE — Telephone Encounter (Signed)
Lvm-to pt wife does not  to repeat lab that is already done this month.

## 2021-04-23 ENCOUNTER — Ambulatory Visit: Payer: BC Managed Care – PPO | Admitting: Endocrinology

## 2021-06-02 ENCOUNTER — Other Ambulatory Visit: Payer: Self-pay

## 2021-06-02 ENCOUNTER — Encounter: Payer: Self-pay | Admitting: Endocrinology

## 2021-06-02 ENCOUNTER — Ambulatory Visit: Payer: BC Managed Care – PPO | Admitting: Endocrinology

## 2021-06-02 VITALS — BP 152/96 | HR 107 | Ht 78.5 in | Wt 293.0 lb

## 2021-06-02 DIAGNOSIS — E1169 Type 2 diabetes mellitus with other specified complication: Secondary | ICD-10-CM | POA: Insufficient documentation

## 2021-06-02 DIAGNOSIS — N521 Erectile dysfunction due to diseases classified elsewhere: Secondary | ICD-10-CM

## 2021-06-02 DIAGNOSIS — Z794 Long term (current) use of insulin: Secondary | ICD-10-CM | POA: Diagnosis not present

## 2021-06-02 DIAGNOSIS — I10 Essential (primary) hypertension: Secondary | ICD-10-CM | POA: Diagnosis not present

## 2021-06-02 DIAGNOSIS — E1165 Type 2 diabetes mellitus with hyperglycemia: Secondary | ICD-10-CM

## 2021-06-02 HISTORY — DX: Type 2 diabetes mellitus with other specified complication: E11.69

## 2021-06-02 HISTORY — DX: Type 2 diabetes mellitus with other specified complication: N52.1

## 2021-06-02 MED ORDER — CANAGLIFLOZIN 300 MG PO TABS: 300.0000 mg | ORAL_TABLET | Freq: Every day | ORAL | 3 refills | Status: DC

## 2021-06-02 MED ORDER — NOVOLOG MIX 70/30 FLEXPEN (70-30) 100 UNIT/ML ~~LOC~~ SUPN: PEN_INJECTOR | SUBCUTANEOUS | 1 refills | Status: DC

## 2021-06-02 MED ORDER — INSULIN PEN NEEDLE 32G X 4 MM MISC: 3 refills | Status: DC

## 2021-06-02 MED ORDER — GLUCOSE BLOOD VI STRP: ORAL_STRIP | 3 refills | Status: AC

## 2021-06-02 MED ORDER — OZEMPIC (1 MG/DOSE) 2 MG/1.5ML ~~LOC~~ SOPN: 1.0000 mg | PEN_INJECTOR | SUBCUTANEOUS | 2 refills | Status: DC

## 2021-06-02 MED ORDER — SILDENAFIL CITRATE 20 MG PO TABS: ORAL_TABLET | ORAL | 0 refills | Status: DC

## 2021-06-02 NOTE — Progress Notes (Signed)
Patient ID: Lawrence Fisher, male   DOB: 1970/12/09, 50 y.o.   MRN: [id number]          Reason for Appointment: Follow-up for Type 2 Diabetes    History of Present Illness:          Date of diagnosis of type 2 diabetes mellitus:2015        Background history:   In 2015  he was having symptoms of weakness and increased thirst and was found to have glucose of about 500 in emergency room. He was initially treated with metformin 500 mg twice a day only Although his blood sugars improved significantly initially they were higher subsequently  He started Bydureon in 2016 Appears to have had persistently poor control with A1c 11% in 12/16 Also A1c in 3/21 was higher at 11.4 compared to 9.0 the previous year  Recent history:   His A1c is 9.7 and relatively better  INSULIN regimen is:  Novolog Mix 30 units before breakfast and 30 after dinner     Non-insulin hypoglycemic drugs the patient is taking are: Invokana 300 mg daily, on Ozempic 0.5 mg weekly  Current management, blood sugar patterns and problems identified: He was told to take his insulin before breakfast and dinnertime but he is frequently missing his doses in the evening He says that he is trying to take his insulin at about 8 PM after work even though he is eating supper around 5 PM  He is generally taking his morning insulin However since he is not able to get his wife to do his injection at suppertime because his wife is not available  With this his fasting readings are consistently over 200  He has only 3 readings at suppertime which are variable  He now says that he is usually not eating breakfast are usually lunch but when he gets to work in the morning he will feel dizzy and a little nauseous and will eat a snack  Again does not check his blood sugars on his own  He has however taken his OZEMPIC as directed since July except he missed his last dose  He has no difficulty with insurance coverage or side effects with this  now His wife is again giving him the Ozempic  Although he is somewhat active at work not clear why he has gained a significant amount of weight  He thinks he is taking his Invokana although this is now not covered by his insurance        Side effects from medications have been: Diarrhea from Metformin     Typical meal intake: Breakfast is skipped or oatmeal or grits, eggs or sausage or [place] croissant.  Occasionally eating fast food Has irregular mealtimes especially lunch               Glucose monitoring: Mostly once a day glucometer:  Contour    Blood Glucose readings    PRE-MEAL Fasting Lunch Dinner Bedtime Overall  Glucose range: 208 329  120-216    Mean/median: 272    151   POST-MEAL PC Breakfast PC Lunch PC Dinner  Glucose range:   ?  Mean/median:        Dietician visit, most recent: Never  Weight history:  Wt Readings from Last 3 Encounters:  06/02/21 293 lb (132.9 kg)  02/19/21 286 lb 3.2 oz (129.8 kg)  02/14/21 270 lb (122.5 kg)      Glycemic control:   Lab Results  Component Value Date  HGBA1C 9.7 (H) 03/26/2021   HGBA1C 11.8 (A) 12/31/2020   HGBA1C 12.9 (A) 09/24/2020   Lab Results  Component Value Date   MICROALBUR 15.1 (H) 12/15/2020   LDLCALC 89 03/08/2016   CREATININE 1.09 03/26/2021   Lab Results  Component Value Date   MICRALBCREAT 30.5 (H) 12/15/2020   Last creatinine 1.04  Lab Results  Component Value Date   FRUCTOSAMINE 389 (H) 12/15/2020   FRUCTOSAMINE 253 01/19/2017   FRUCTOSAMINE 287 (H) 03/08/2016    No visits with results within 1 Week(s) from this visit.  Latest known visit with results is:  Lab on 03/26/2021  Component Date Value Ref Range Status   Sodium 03/26/2021 137  135 - 145 mEq/L Final   Potassium 03/26/2021 4.3  3.5 - 5.1 mEq/L Final   Chloride 03/26/2021 100  96 - 112 mEq/L Final   CO2 03/26/2021 29  19 - 32 mEq/L Final   Glucose, Bld 03/26/2021 233 (A) 70 - 99 mg/dL Final   BUN 30/16/0109 15  6 - 23  mg/dL Final   Creatinine, Ser 03/26/2021 1.09  0.40 - 1.50 mg/dL Final   GFR 32/35/5732 79.92  >60.00 mL/min Final   Calculated using the CKD-EPI Creatinine Equation (2021)   Calcium 03/26/2021 9.9  8.4 - 10.5 mg/dL Final   Hgb K0U MFr Bld 03/26/2021 9.7 (A) 4.6 - 6.5 % Final   Glycemic Control Guidelines for People with Diabetes:Non Diabetic:  <6%Goal of Therapy: <7%Additional Action Suggested:  >8%     Allergies as of 06/02/2021       Reactions   Metformin And Related Diarrhea        Medication List        Accurate as of June 02, 2021  2:53 PM. If you have any questions, ask your nurse or doctor.          STOP taking these medications    HYDROcodone-acetaminophen 5-325 MG tablet Commonly known as: Norco Stopped by: Reather Littler, MD   NovoLOG FlexPen 100 UNIT/ML FlexPen Generic drug: insulin aspart Stopped by: Reather Littler, MD   Ozempic (0.25 or 0.5 MG/DOSE) 2 MG/1.5ML Sopn Generic drug: Semaglutide(0.25 or 0.5MG /DOS) Replaced by: Ozempic (1 MG/DOSE) 2 MG/1.5ML Sopn Stopped by: Reather Littler, MD       TAKE these medications    amLODipine 10 MG tablet Commonly known as: NORVASC Take by mouth.   Bayer Microlet Lancets lancets Use as instructed to check blood sugar 3 times per day dx code E11.65   canagliflozin 300 MG Tabs tablet Commonly known as: Invokana Take 1 tablet (300 mg total) by mouth daily before breakfast.   Dexcom G6 Transmitter Misc Use one every 90 days   FreeStyle Libre 2 Sensor Misc 2 Devices by Does not apply route every 14 (fourteen) days.   glucose blood test strip Commonly known as: Banker Next Test Use as instructed to check blood sugar 3 times per day Dx code E11.65   Insulin Pen Needle 32G X 4 MM Misc Use to inject insulin What changed:  medication strength additional instructions Changed by: Reather Littler, MD   lisinopril 10 MG tablet Commonly known as: ZESTRIL Take 10 mg by mouth daily. Reported on 03/08/2016    lisinopril 40 MG tablet Commonly known as: ZESTRIL Take 40 mg by mouth daily.   metFORMIN 500 MG 24 hr tablet Commonly known as: GLUCOPHAGE-XR Take 1 tablet (500 mg total) by mouth daily with breakfast.   multivitamin capsule Take 1 capsule by mouth  daily.   NovoLOG Mix 70/30 FlexPen (70-30) 100 UNIT/ML FlexPen Generic drug: insulin aspart protamine - aspart Inject 35 units before breakfast and 45 units before supper What changed: additional instructions Changed by: Reather Littler, MD   Ozempic (1 MG/DOSE) 2 MG/1.5ML Sopn Generic drug: Semaglutide (1 MG/DOSE) Inject 1 mg into the skin once a week. Replaces: Ozempic (0.25 or 0.5 MG/DOSE) 2 MG/1.5ML Sopn Started by: Reather Littler, MD   sertraline 100 MG tablet Commonly known as: ZOLOFT Take 100 mg by mouth daily.   sildenafil 20 MG tablet Commonly known as: REVATIO Take 3-4 pills as needed before sexual activity Started by: Reather Littler, MD        Allergies:  Allergies  Allergen Reactions   Metformin And Related Diarrhea    Past Medical History:  Diagnosis Date   Diabetes mellitus without complication (HCC)    Hypertension     Past Surgical History:  Procedure Laterality Date   ANKLE SURGERY     CHOLECYSTECTOMY     OPEN REDUCTION INTERNAL FIXATION (ORIF) METACARPAL Right 06/02/2016   Procedure: OPEN REDUCTION INTERNAL FIXATION (ORIF) right ring phalanx and  METACARPAL fractures;  Surgeon: Betha Loa, MD;  Location: [facility];  Service: Orthopedics;  Laterality: Right;  OPEN REDUCTION INTERNAL FIXATION (ORIF) right ring phalanx and  METACARPAL fractures   TONSILLECTOMY      No family history on file.  Social History:  reports that he has been smoking cigarettes. He has been smoking an average of .5 packs per day. He has never used smokeless tobacco. He reports current alcohol use. He reports current drug use. Drug: Marijuana.   Review of Systems   Lipid history: Previously had been on  atorvastatin from his PCP Last labs from PCP in 08/03/2020 show: Triglycerides 309, LDL 129 with HDL 48    Lab Results  Component Value Date   CHOL 162 03/08/2016   HDL 50.00 03/08/2016   LDLCALC 89 03/08/2016   TRIG 115.0 03/08/2016   CHOLHDL 3 03/08/2016           Hypertension: Has been present and is on lisinopril and amlodipine from his PCP However even though he thinks he is taking his medication regularly his blood pressure is consistently high and he has not made a follow-up appointment  BP Readings from Last 3 Encounters:  06/02/21 (!) 152/96  02/19/21 (!) 162/102  02/14/21 (!) 149/86    Most recent eye exam was a few years ago  Most recent foot exam: 09/2020  Currently known complications of diabetes: Neuropathy, erectile dysfunction  Has persistent chronic diarrhea ? IBS  LABS:  No visits with results within 1 Week(s) from this visit.  Latest known visit with results is:  Lab on 03/26/2021  Component Date Value Ref Range Status   Sodium 03/26/2021 137  135 - 145 mEq/L Final   Potassium 03/26/2021 4.3  3.5 - 5.1 mEq/L Final   Chloride 03/26/2021 100  96 - 112 mEq/L Final   CO2 03/26/2021 29  19 - 32 mEq/L Final   Glucose, Bld 03/26/2021 233 (A) 70 - 99 mg/dL Final   BUN 99/83/3825 15  6 - 23 mg/dL Final   Creatinine, Ser 03/26/2021 1.09  0.40 - 1.50 mg/dL Final   GFR 05/39/7673 79.92  >60.00 mL/min Final   Calculated using the CKD-EPI Creatinine Equation (2021)   Calcium 03/26/2021 9.9  8.4 - 10.5 mg/dL Final   Hgb A1P MFr Bld 03/26/2021 9.7 (A) 4.6 - 6.5 %  Final   Glycemic Control Guidelines for People with Diabetes:Non Diabetic:  <6%Goal of Therapy: <7%Additional Action Suggested:  >8%     Physical Examination:  BP (!) 152/96   Pulse (!) 107   Ht 6' 6.5" (1.994 m)   Wt 293 lb (132.9 kg)   SpO2 98%   BMI 33.43 kg/m         ASSESSMENT:  Diabetes type 2 on insulin  See history of present illness for detailed discussion of current diabetes  management, blood sugar patterns and problems identified  His A1c is 9.7, previously much higher  Current treatment regimen is 70/30 insulin, Invokana and now Ozempic  His blood sugars are being monitored mostly in the mornings and these are consistently high This is related to his not taking his evening insulin frequently He is not understanding the need to take his premixed insulin before meals Also with not eating in the morning he is likely getting hypoglycemic symptoms midmorning when he starts getting a little active at work Blood sugars are not documented low and lowest reading 120 at suppertime  He says that he is not able to get the freestyle libre covered for constant monitoring  He is however gaining weight despite starting Ozempic possibly because of better diabetes control and more regular regimen of at least once a day insulin   PLAN:     Therapy changes:  He will take 35 units of the NovoLog mix at breakfast but 45 at dinnertime He will make sure he takes the evening insulin before eating Also needs to eat breakfast so at least a large snack when he gets to work in the mornings to avoid hypoglycemia Go up to 1 mg Ozempic weekly Start checking blood sugars more consistently after dinner also If Invokana is not covered on his insurance may try [place]  Again recommend that he follow-up with his PCP regarding management of the hypertension and likely needs additional medications, also unclear what dose of lisinopril he is taking Also needs follow-up of his lipids  Since he is requesting medication for erectile dysfunction we will have him take some generic sildenafil Advised him to avoid any OTC remedies that he is trying  Patient Instructions  Take 35 units before breakfast AND 45 AT SUPPER  Check blood sugars on waking up 4-5 days a week AND SOME BEFORE SUPPER  Also check blood sugars about 2 hours after meals and do this after different meals by  rotation  Recommended blood sugar levels on waking up are 90-130 and about 2 hours after meal is 130-180  Please bring your blood sugar monitor to each visit, thank you  See Ms Maple Hudson for BP   Third visit time including counseling = 20 minutes   Reather Littler 06/02/2021, 2:53 PM   Note: This office note was prepared with Dragon voice recognition system technology. Any transcriptional errors that result from this process are unintentional.

## 2021-06-02 NOTE — Patient Instructions (Addendum)
Take 35 units before breakfast AND 45 AT SUPPER  Check blood sugars on waking up 4-5 days a week AND SOME BEFORE SUPPER  Also check blood sugars about 2 hours after meals and do this after different meals by rotation  Recommended blood sugar levels on waking up are 90-130 and about 2 hours after meal is 130-180  Please bring your blood sugar monitor to each visit, thank you  See Ms Maple Hudson for BP

## 2021-07-01 ENCOUNTER — Telehealth: Payer: Self-pay | Admitting: Pharmacy Technician

## 2021-07-01 ENCOUNTER — Other Ambulatory Visit (HOSPITAL_COMMUNITY): Payer: Self-pay

## 2021-07-01 ENCOUNTER — Telehealth: Payer: Self-pay | Admitting: Endocrinology

## 2021-07-01 DIAGNOSIS — E1165 Type 2 diabetes mellitus with hyperglycemia: Secondary | ICD-10-CM

## 2021-07-01 DIAGNOSIS — Z794 Long term (current) use of insulin: Secondary | ICD-10-CM

## 2021-07-01 MED ORDER — DAPAGLIFLOZIN PROPANEDIOL 10 MG PO TABS
10.0000 mg | ORAL_TABLET | Freq: Every day | ORAL | 3 refills | Status: DC
Start: 2021-07-01 — End: 2021-07-02

## 2021-07-01 NOTE — Telephone Encounter (Signed)
Patient Advocate Encounter  Prior Authorization for [name]  has been approved.    Effective dates: 07/01/21 through 08/21/2038  Per Test Claim Patients co-pay is $15.   Spoke with Pharmacy to Process.  Sherilyn Dacosta, CPhT Patient Advocate [facility] Phone: [phone] Fax:  [phone]

## 2021-07-01 NOTE — Telephone Encounter (Signed)
Called andleft a detailed message for Lawrence Fisher advising rx was sent 06/02/21 with 3 refills to [name] and they should have a refill for Invokana.

## 2021-07-01 NOTE — Telephone Encounter (Signed)
Pt.notified

## 2021-07-01 NOTE — Telephone Encounter (Signed)
Pt spouse calling in to request refill of canagliflozin (INVOKANA) 300 MG TABS tablet. Pt has none.  Pharmacy gave an exclustion for [facility] [id number] - [place], [place] - [address]  [address], [place] [address]   Pt spouse contact [phone]

## 2021-07-01 NOTE — Telephone Encounter (Signed)
Called and advised pt rx for [name] was sent to pharmacy and pt was advised by pharmacy insurance would not cover Farxiga either. Request for prior auth for Farxiga sent to PA team.

## 2021-07-01 NOTE — Telephone Encounter (Signed)
Contacted pharmacy and they received a denial for Invokana (not covered) Hendricks Limes is a covered alternative. Switch or submit a PA for Invokana?

## 2021-07-01 NOTE — Telephone Encounter (Signed)
Patient Advocate Encounter   Received notification from CMA/Office that prior authorization for Lawrence Fisher is required by his/her insurance [employer].  Per Test Claim: Unable to perform test claim   PA submitted on 07/01/21 Submitted through [facility] website. [email or web address] May call for follow up [phone] Status is pending    [facility] will continue to follow:   Sherilyn Dacosta, CPhT Patient Advocate [facility] Phone: [phone] Fax:  [phone]

## 2021-07-02 MED ORDER — EMPAGLIFLOZIN 25 MG PO TABS: 25.0000 mg | ORAL_TABLET | Freq: Every day | ORAL | 2 refills | Status: DC

## 2021-07-02 NOTE — Telephone Encounter (Signed)
Jardiance sent in. Unable to get in touch with spouse

## 2021-07-19 ENCOUNTER — Other Ambulatory Visit: Payer: BC Managed Care – PPO

## 2021-07-22 ENCOUNTER — Ambulatory Visit: Payer: BC Managed Care – PPO | Admitting: Endocrinology

## 2021-09-06 ENCOUNTER — Other Ambulatory Visit: Payer: Self-pay

## 2021-09-06 ENCOUNTER — Other Ambulatory Visit (INDEPENDENT_AMBULATORY_CARE_PROVIDER_SITE_OTHER): Payer: BC Managed Care – PPO

## 2021-09-06 DIAGNOSIS — E1165 Type 2 diabetes mellitus with hyperglycemia: Secondary | ICD-10-CM | POA: Diagnosis not present

## 2021-09-06 DIAGNOSIS — Z794 Long term (current) use of insulin: Secondary | ICD-10-CM

## 2021-09-06 LAB — BASIC METABOLIC PANEL
BUN: 13 mg/dL (ref 6–23)
CO2: 28 mEq/L (ref 19–32)
Calcium: 9.6 mg/dL (ref 8.4–10.5)
Chloride: 102 mEq/L (ref 96–112)
Creatinine, Ser: 1.01 mg/dL (ref 0.40–1.50)
GFR: 87.3 mL/min (ref >60.00)
Glucose, Bld: 257 mg/dL — ABNORMAL HIGH (ref 70–99)
Potassium: 4.8 mEq/L (ref 3.5–5.1)
Sodium: 138 mEq/L (ref 135–145)

## 2021-09-07 LAB — FRUCTOSAMINE: Fructosamine: 333 umol/L — ABNORMAL HIGH (ref 0–285)

## 2021-09-08 ENCOUNTER — Ambulatory Visit: Payer: BC Managed Care – PPO | Admitting: Endocrinology

## 2021-09-10 ENCOUNTER — Ambulatory Visit (INDEPENDENT_AMBULATORY_CARE_PROVIDER_SITE_OTHER): Payer: BC Managed Care – PPO | Admitting: Endocrinology

## 2021-09-10 ENCOUNTER — Other Ambulatory Visit: Payer: Self-pay

## 2021-09-10 ENCOUNTER — Encounter: Payer: Self-pay | Admitting: Endocrinology

## 2021-09-10 VITALS — BP 138/86 | HR 105 | Ht 78.0 in | Wt 298.8 lb

## 2021-09-10 DIAGNOSIS — Z794 Long term (current) use of insulin: Secondary | ICD-10-CM | POA: Diagnosis not present

## 2021-09-10 DIAGNOSIS — I10 Essential (primary) hypertension: Secondary | ICD-10-CM | POA: Diagnosis not present

## 2021-09-10 DIAGNOSIS — E782 Mixed hyperlipidemia: Secondary | ICD-10-CM

## 2021-09-10 DIAGNOSIS — E1165 Type 2 diabetes mellitus with hyperglycemia: Secondary | ICD-10-CM

## 2021-09-10 MED ORDER — DAPAGLIFLOZIN PROPANEDIOL 10 MG PO TABS: 10.0000 mg | ORAL_TABLET | Freq: Every day | ORAL | 3 refills | Status: DC

## 2021-09-10 MED ORDER — OZEMPIC (1 MG/DOSE) 2 MG/1.5ML ~~LOC~~ SOPN: 1.0000 mg | PEN_INJECTOR | SUBCUTANEOUS | 2 refills | Status: DC

## 2021-09-10 NOTE — Patient Instructions (Addendum)
Take 50 units at supper instead of 40

## 2021-09-10 NOTE — Progress Notes (Signed)
Patient ID: Lawrence MessierJerry P Fisher, male   DOB: July 18, 1971, 51 y.o.   MRN: [id number]          Reason for Appointment: Follow-up for Type 2 Diabetes    History of Present Illness:          Date of diagnosis of type 2 diabetes mellitus:2015        Background history:   In 2015  he was having symptoms of weakness and increased thirst and was found to have glucose of about 500 in emergency room. He was initially treated with metformin 500 mg twice a day only Although his blood sugars improved significantly initially they were higher subsequently  He started Bydureon in 2016 Appears to have had persistently poor control with A1c 11% in 12/16 Also A1c in 3/21 was higher at 11.4 compared to 9.0 the previous year  Recent history:   His A1c is 9.7 last August Fructosamine is relatively better at 333  INSULIN regimen is:  Novolog Mix 35 units before breakfast and 40 after dinner     Non-insulin hypoglycemic drugs the patient is taking are: Jardiance 25mg  daily, on Ozempic 1 mg weekly  Current management, blood sugar patterns and problems identified: He forgot his blood sugar monitor today  He thinks he has been taking his insulin doses before breakfast and supper but basically being given by his wife  However instead of 45 units he is only taking 40 units at dinner  Not clear what his blood sugars are after dinner but his fasting readings are persistently high and reportedly somewhat variable He says that he is usually eating mostly 1 meal in the evening but is frequently staying up at night not sleeping but not clear if he is getting snacks at that time He says he is trying to cut back on fried food but continues to gain weight This is despite reportedly taking his 1 mg Ozempic more regularly except he ran out last week, taking it on Wednesdays Lab glucose was 257 fasting No hypoglycemia reported This is despite usually not eating a meal during the day He does not exercise There is [employer]  was not covered and he is taking Jardiance since his last visit.  He says he thinks he is getting a rash with itching on different areas especially his neck        Side effects from medications have been: Diarrhea from Metformin     Typical meal intake: Breakfast is skipped or oatmeal or grits, eggs or sausage or [place]urkey croissant.  Occasionally eating fast food Has irregular mealtimes especially lunch               Glucose monitoring: Mostly once a day glucometer:  Contour    Blood Glucose readings by recall  Before breakfast usually 175-225 Before dinner?  190  Previously  PRE-MEAL Fasting Lunch Dinner Bedtime Overall  Glucose range: 208 329  120-216    Mean/median: 272    151   POST-MEAL PC Breakfast PC Lunch PC Dinner  Glucose range:   ?  Mean/median:        Dietician visit, most recent: Never  Weight history:  Wt Readings from Last 3 Encounters:  09/10/21 298 lb 12.8 oz (135.5 kg)  06/02/21 293 lb (132.9 kg)  02/19/21 286 lb 3.2 oz (129.8 kg)      Glycemic control:   Lab Results  Component Value Date   HGBA1C 9.7 (H) 03/26/2021   HGBA1C 11.8 (A) 12/31/2020   HGBA1C  12.9 (A) 09/24/2020   Lab Results  Component Value Date   MICROALBUR 15.1 (H) 12/15/2020   LDLCALC 89 03/08/2016   CREATININE 1.01 09/06/2021   Lab Results  Component Value Date   MICRALBCREAT 30.5 (H) 12/15/2020   Last creatinine 1.04  Lab Results  Component Value Date   FRUCTOSAMINE 333 (H) 09/06/2021   FRUCTOSAMINE 389 (H) 12/15/2020   FRUCTOSAMINE 253 01/19/2017    Lab on 09/06/2021  Component Date Value Ref Range Status   Fructosamine 09/06/2021 333 (H)  0 - 285 umol/L Final   Comment: Published reference interval for apparently healthy subjects between age 48 and 12 is 93 - 285 umol/L and in a poorly controlled diabetic population is 228 - 563 umol/L with a mean of 396 umol/L.    Sodium 09/06/2021 138  135 - 145 mEq/L Final   Potassium 09/06/2021 4.8  3.5 - 5.1 mEq/L  Final   Chloride 09/06/2021 102  96 - 112 mEq/L Final   CO2 09/06/2021 28  19 - 32 mEq/L Final   Glucose, Bld 09/06/2021 257 (H)  70 - 99 mg/dL Final   BUN 90/30/0923 13  6 - 23 mg/dL Final   Creatinine, Ser 09/06/2021 1.01  0.40 - 1.50 mg/dL Final   GFR 30/02/6225 87.30  >60.00 mL/min Final   Calculated using the CKD-EPI Creatinine Equation (2021)   Calcium 09/06/2021 9.6  8.4 - 10.5 mg/dL Final    Allergies as of 09/10/2021       Reactions   Metformin And Related Diarrhea        Medication List        Accurate as of September 10, 2021  2:08 PM. If you have any questions, ask your nurse or doctor.          STOP taking these medications    empagliflozin 25 MG Tabs tablet Commonly known as: Jardiance Stopped by: Reather Littler, MD       TAKE these medications    amLODipine 10 MG tablet Commonly known as: NORVASC Take by mouth.   Bayer Microlet Lancets lancets Use as instructed to check blood sugar 3 times per day dx code E11.65   dapagliflozin propanediol 10 MG Tabs tablet Commonly known as: Farxiga Take 1 tablet (10 mg total) by mouth daily before breakfast. Started by: Reather Littler, MD   Dexcom G6 Transmitter Misc Use one every 90 days   FreeStyle Libre 2 Sensor Misc 2 Devices by Does not apply route every 14 (fourteen) days.   glucose blood test strip Commonly known as: Banker Next Test Use as instructed to check blood sugar 3 times per day Dx code E11.65   Insulin Pen Needle 32G X 4 MM Misc Use to inject insulin   lisinopril 10 MG tablet Commonly known as: ZESTRIL Take 10 mg by mouth daily. Reported on 03/08/2016   lisinopril 40 MG tablet Commonly known as: ZESTRIL Take 40 mg by mouth daily.   metFORMIN 500 MG 24 hr tablet Commonly known as: GLUCOPHAGE-XR Take 1 tablet (500 mg total) by mouth daily with breakfast.   multivitamin capsule Take 1 capsule by mouth daily.   NovoLOG Mix 70/30 FlexPen (70-30) 100 UNIT/ML FlexPen Generic drug:  insulin aspart protamine - aspart Inject 35 units before breakfast and 45 units before supper   Ozempic (1 MG/DOSE) 2 MG/1.5ML Sopn Generic drug: Semaglutide (1 MG/DOSE) Inject 1 mg into the skin once a week.   sertraline 100 MG tablet Commonly known as: ZOLOFT Take 100  mg by mouth daily.   sildenafil 20 MG tablet Commonly known as: REVATIO Take 3-4 pills as needed before sexual activity        Allergies:  Allergies  Allergen Reactions   Metformin And Related Diarrhea    Past Medical History:  Diagnosis Date   Diabetes mellitus without complication (HCC)    Hypertension     Past Surgical History:  Procedure Laterality Date   ANKLE SURGERY     CHOLECYSTECTOMY     OPEN REDUCTION INTERNAL FIXATION (ORIF) METACARPAL Right 06/02/2016   Procedure: OPEN REDUCTION INTERNAL FIXATION (ORIF) right ring phalanx and  METACARPAL fractures;  Surgeon: Betha Loa, MD;  [facility]:  [facility];  Service: Orthopedics;  Laterality: Right;  OPEN REDUCTION INTERNAL FIXATION (ORIF) right ring phalanx and  METACARPAL fractures   TONSILLECTOMY      No family history on file.  Social History:  reports that he has been smoking cigarettes. He has been smoking an average of .5 packs per day. He has never used smokeless tobacco. He reports current alcohol use. He reports current drug use. Drug: Marijuana.   Review of Systems   Lipid history: Previously had been on atorvastatin from his PCP Last labs from PCP in 08/03/2020 show: Triglycerides 309, LDL 129 with HDL 48    Lab Results  Component Value Date   CHOL 162 03/08/2016   HDL 50.00 03/08/2016   LDLCALC 89 03/08/2016   TRIG 115.0 03/08/2016   CHOLHDL 3 03/08/2016           Hypertension: Has been present and is on lisinopril and amlodipine from his PCP   BP Readings from Last 3 Encounters:  09/10/21 138/86  06/02/21 (!) 152/96  02/19/21 (!) 162/102    Most recent eye exam was a few years ago  Most recent foot  exam: 09/2020  Currently known complications of diabetes: Neuropathy, erectile dysfunction  He has been prescribed sildenafil for ED  Has persistent chronic diarrhea ? IBS  LABS:  Lab on 09/06/2021  Component Date Value Ref Range Status   Fructosamine 09/06/2021 333 (H)  0 - 285 umol/L Final   Comment: Published reference interval for apparently healthy subjects between age 53 and 58 is 26 - 285 umol/L and in a poorly controlled diabetic population is 228 - 563 umol/L with a mean of 396 umol/L.    Sodium 09/06/2021 138  135 - 145 mEq/L Final   Potassium 09/06/2021 4.8  3.5 - 5.1 mEq/L Final   Chloride 09/06/2021 102  96 - 112 mEq/L Final   CO2 09/06/2021 28  19 - 32 mEq/L Final   Glucose, Bld 09/06/2021 257 (H)  70 - 99 mg/dL Final   BUN 81/82/9937 13  6 - 23 mg/dL Final   Creatinine, Ser 09/06/2021 1.01  0.40 - 1.50 mg/dL Final   GFR 16/96/7893 87.30  >60.00 mL/min Final   Calculated using the CKD-EPI Creatinine Equation (2021)   Calcium 09/06/2021 9.6  8.4 - 10.5 mg/dL Final    Physical Examination:  BP 138/86    Pulse (!) 105    Ht 6\' 6"  (1.981 m)    Wt 298 lb 12.8 oz (135.5 kg)    SpO2 95%    BMI 34.53 kg/m   He has a couple of papules on the right shoulder area without erythema On the left neck he has a small patch of slightly pigmented scaly lesion, no other rash      ASSESSMENT:  Diabetes type 2 on  insulin  See history of present illness for detailed discussion of current diabetes management, blood sugar patterns and problems identified  His last A1c is 9.7, he has not come back for follow-up as directed after his visit in October  Fructosamine 333  Current treatment regimen is 70/30 insulin twice a day, Jardiance and now Ozempic  His blood sugars are significantly high in the morning and not clear if this is related to his eating during the night  He feels like he is taking his insulin regularly given by his wife Also has taken Ozempic 1 mg weekly except  for in the last week or so  He continues to gain weight Blood sugars are not being monitored after meals and difficult to adjust his insulin without full information Previously could not afford CGM  HYPERTENSION: Appears to be better controlled now  Skin rash: Unlikely to be from [place] and he needs to discuss with PCP if   PLAN:     Therapy changes:  He will still take 35 units of the NovoLog mix at breakfast but will go up to 50 units at dinnertime Reminded him about taking the insulin before mealtime New prescription for Ozempic sent and he will take this weekly Given written instructions on checking blood sugars after dinner more consistently and not before dinner Consultation with nutritionist for meal planning and general diabetes education  Cut back on any high fat foods and fried foods Start regular walking for exercise  Since he wants to try another medication instead of Jardiance and will empirically switch him to [place] if covered  More regular follow-up He will bring his monitor for download on each visit  Patient Instructions  Take 50 units at supper instead of 40        Yula Crotwell 09/10/2021, 2:08 PM   Note: This office note was prepared with Dragon voice recognition system technology. Any transcriptional errors that result from this process are unintentional.

## 2021-10-11 DIAGNOSIS — R5382 Chronic fatigue, unspecified: Secondary | ICD-10-CM | POA: Insufficient documentation

## 2021-10-11 HISTORY — DX: Chronic fatigue, unspecified: R53.82

## 2021-10-20 DIAGNOSIS — E559 Vitamin D deficiency, unspecified: Secondary | ICD-10-CM

## 2021-10-20 HISTORY — DX: Vitamin D deficiency, unspecified: E55.9

## 2021-11-11 ENCOUNTER — Ambulatory Visit: Payer: BC Managed Care – PPO | Admitting: Endocrinology

## 2021-11-17 ENCOUNTER — Other Ambulatory Visit: Payer: Self-pay

## 2021-11-17 ENCOUNTER — Other Ambulatory Visit (INDEPENDENT_AMBULATORY_CARE_PROVIDER_SITE_OTHER): Payer: BC Managed Care – PPO

## 2021-11-17 DIAGNOSIS — E782 Mixed hyperlipidemia: Secondary | ICD-10-CM | POA: Diagnosis not present

## 2021-11-17 DIAGNOSIS — E1165 Type 2 diabetes mellitus with hyperglycemia: Secondary | ICD-10-CM

## 2021-11-17 DIAGNOSIS — Z794 Long term (current) use of insulin: Secondary | ICD-10-CM | POA: Diagnosis not present

## 2021-11-17 LAB — COMPREHENSIVE METABOLIC PANEL
ALT: 18 U/L (ref 0–53)
AST: 18 U/L (ref 0–37)
Albumin: 4.4 g/dL (ref 3.5–5.2)
Alkaline Phosphatase: 70 U/L (ref 39–117)
BUN: 16 mg/dL (ref 6–23)
CO2: 30 mEq/L (ref 19–32)
Calcium: 9.9 mg/dL (ref 8.4–10.5)
Chloride: 102 mEq/L (ref 96–112)
Creatinine, Ser: 1.02 mg/dL (ref 0.40–1.50)
GFR: 86.16 mL/min (ref >60.00)
Glucose, Bld: 123 mg/dL — ABNORMAL HIGH (ref 70–99)
Potassium: 4 mEq/L (ref 3.5–5.1)
Sodium: 141 mEq/L (ref 135–145)
Total Bilirubin: 0.4 mg/dL (ref 0.2–1.2)
Total Protein: 7.4 g/dL (ref 6.0–8.3)

## 2021-11-17 LAB — LIPID PANEL
Cholesterol: 152 mg/dL (ref 0–200)
HDL: 43 mg/dL (ref >39.00)
LDL Cholesterol: 77 mg/dL (ref 0–99)
NonHDL: 108.97
Total CHOL/HDL Ratio: 4
Triglycerides: 160 mg/dL — ABNORMAL HIGH (ref 0.0–149.0)
VLDL: 32 mg/dL (ref 0.0–40.0)

## 2021-11-17 LAB — HEMOGLOBIN A1C: Hgb A1c MFr Bld: 8.2 % — ABNORMAL HIGH (ref 4.6–6.5)

## 2021-11-22 ENCOUNTER — Ambulatory Visit: Payer: BC Managed Care – PPO | Admitting: Endocrinology

## 2021-11-22 ENCOUNTER — Encounter: Payer: Self-pay | Admitting: Endocrinology

## 2021-11-22 VITALS — BP 138/72 | HR 106 | Ht 78.0 in | Wt 278.2 lb

## 2021-11-22 DIAGNOSIS — E1165 Type 2 diabetes mellitus with hyperglycemia: Secondary | ICD-10-CM

## 2021-11-22 DIAGNOSIS — Z794 Long term (current) use of insulin: Secondary | ICD-10-CM | POA: Diagnosis not present

## 2021-11-22 DIAGNOSIS — I10 Essential (primary) hypertension: Secondary | ICD-10-CM | POA: Diagnosis not present

## 2021-11-22 DIAGNOSIS — E782 Mixed hyperlipidemia: Secondary | ICD-10-CM | POA: Diagnosis not present

## 2021-11-22 NOTE — Progress Notes (Signed)
Patient ID: Lawrence MessierJerry P Fisher, male   DOB: 02-May-1971, 51 y.o.   MRN: [id number] ?       ? ? ?Reason for Appointment: Follow-up for Type 2 Diabetes ? ? ? ?History of Present Illness:  ?        ?Date of diagnosis of type 2 diabetes mellitus:2015       ? ?Background history:  ? ?In 2015  he was having symptoms of weakness and increased thirst and was found to have glucose of about 500 in emergency room. ?He was initially treated with metformin 500 mg twice a day only ?Although his blood sugars improved significantly initially they were higher subsequently  ?He started Bydureon in 2016 ?Appears to have had persistently poor control with A1c 11% in 12/16 ?Also A1c in 3/21 was higher at 11.4 compared to 9.0 the previous year ? ?Recent history:  ? ?His A1c is 8.2 compared to 9.7 last August ?Fructosamine is last better at 333 ? ?INSULIN regimen is:  Novolog Mix 35 units before breakfast and 45 after dinner    ? ?Non-insulin hypoglycemic drugs the patient is taking are:?  Farxiga, on Ozempic 1 mg weekly ? ?Current management, blood sugar patterns and problems identified: ?He was told to take 50 units before insulin of the evening dose but he is only taking 45 ?He thinks he has been taking his insulin doses before breakfast and supper but again being given by his wife  ?Has not missed any doses ?His blood sugar in the lab was 123 fasting on the appears to have somewhat higher readings when he wakes up at home ?He says that he is eating less with Ozempic but usually has had very small meals during the day, only sometimes will have a sandwich at lunch ?As before he is forgetting to check his blood sugars after supper and not clear how his postprandial readings are ?With his taking Ozempic overall his intake appears to be less and he is losing weight ?He does have the freestyle LIBRE available but he did not bring it as he is not sure how to use it ?He does not exercise  ? ?      ?Side effects from medications have been: Diarrhea  from Metformin ?   ? ?Typical meal intake: Breakfast is skipped or oatmeal or grits, eggs or sausage or [place]key croissant.  Occasionally eating fast food ?Has irregular mealtimes especially lunch              ? ?Glucose monitoring: Mostly once a day glucometer:  Contour ?   ?Blood Glucose readings by download. ? ? ?PRE-MEAL Fasting Lunch Dinner Bedtime Overall  ?Glucose range: 151-255  160 192   ?Mean/median: 191     190  ? ?POST-MEAL PC Breakfast PC Lunch PC Dinner  ?Glucose range:   ?  ?Mean/median:     ? ? ? ?Before breakfast usually 175-225 ?Before dinner?  190 ? ? ?Dietician visit, most recent: Never ? ?Weight history: ? ?Wt Readings from Last 3 Encounters:  ?11/22/21 278 lb 3.2 oz (126.2 kg)  ?09/10/21 298 lb 12.8 oz (135.5 kg)  ?06/02/21 293 lb (132.9 kg)  ? ? ? ? ?Glycemic control: ?  ?Lab Results  ?Component Value Date  ? HGBA1C 8.2 (H) 11/17/2021  ? HGBA1C 9.7 (H) 03/26/2021  ? HGBA1C 11.8 (A) 12/31/2020  ? ?Lab Results  ?Component Value Date  ? MICROALBUR 15.1 (H) 12/15/2020  ? LDLCALC 77 11/17/2021  ? CREATININE 1.02 11/17/2021  ? ?  Lab Results  ?Component Value Date  ? MICRALBCREAT 30.5 (H) 12/15/2020  ? ?Last creatinine 1.04 ? ?Lab Results  ?Component Value Date  ? FRUCTOSAMINE 333 (H) 09/06/2021  ? FRUCTOSAMINE 389 (H) 12/15/2020  ? FRUCTOSAMINE 253 01/19/2017  ? ? ?Lab on 11/17/2021  ?Component Date Value Ref Range Status  ? Cholesterol 11/17/2021 152  0 - 200 mg/dL Final  ? ATP III Classification       Desirable:  < 200 mg/dL               Borderline High:  200 - 239 mg/dL          High:  > = 741 mg/dL  ? Triglycerides 11/17/2021 160.0 (H)  0.0 - 149.0 mg/dL Final  ? Normal:  <287 mg/dLBorderline High:  150 - 199 mg/dL  ? HDL 11/17/2021 43.00  >39.00 mg/dL Final  ? VLDL 86/76/7209 32.0  0.0 - 40.0 mg/dL Final  ? LDL Cholesterol 11/17/2021 77  0 - 99 mg/dL Final  ? Total CHOL/HDL Ratio 11/17/2021 4   Final  ?                Men          Women1/2 Average Risk     3.4          3.3Average Risk           5.0          4.42X Average Risk          9.6          7.13X Average Risk          15.0          11.0                      ? NonHDL 11/17/2021 108.97   Final  ? NOTE:  Non-HDL goal should be 30 mg/dL higher than patient's LDL goal (i.e. LDL goal of < 70 mg/dL, would have non-HDL goal of < 100 mg/dL)  ? Sodium 11/17/2021 141  135 - 145 mEq/L Final  ? Potassium 11/17/2021 4.0  3.5 - 5.1 mEq/L Final  ? Chloride 11/17/2021 102  96 - 112 mEq/L Final  ? CO2 11/17/2021 30  19 - 32 mEq/L Final  ? Glucose, Bld 11/17/2021 123 (H)  70 - 99 mg/dL Final  ? BUN 47/04/6282 16  6 - 23 mg/dL Final  ? Creatinine, Ser 11/17/2021 1.02  0.40 - 1.50 mg/dL Final  ? Total Bilirubin 11/17/2021 0.4  0.2 - 1.2 mg/dL Final  ? Alkaline Phosphatase 11/17/2021 70  39 - 117 U/L Final  ? AST 11/17/2021 18  0 - 37 U/L Final  ? ALT 11/17/2021 18  0 - 53 U/L Final  ? Total Protein 11/17/2021 7.4  6.0 - 8.3 g/dL Final  ? Albumin 66/29/4765 4.4  3.5 - 5.2 g/dL Final  ? GFR 46/50/3546 86.16  >60.00 mL/min Final  ? Calculated using the CKD-EPI Creatinine Equation (2021)  ? Calcium 11/17/2021 9.9  8.4 - 10.5 mg/dL Final  ? Hgb F6C MFr Bld 11/17/2021 8.2 (H)  4.6 - 6.5 % Final  ? Glycemic Control Guidelines for People with Diabetes:Non Diabetic:  <6%Goal of Therapy: <7%Additional Action Suggested:  >8%   ? ? ?Allergies as of 11/22/2021   ? ?   Reactions  ? Metformin And Related Diarrhea  ? ?  ? ?  ?Medication List  ?  ? ?  ?  Accurate as of November 22, 2021  4:19 PM. If you have any questions, ask your nurse or doctor.  ?  ?  ? ?  ? ?amLODipine 10 MG tablet ?Commonly known as: NORVASC ?Take by mouth. ?  ?Engineer, mining lancets ?Use as instructed to check blood sugar 3 times per day dx code E11.65 ?  ?dapagliflozin propanediol 10 MG Tabs tablet ?Commonly known as: [place] ?Take 1 tablet (10 mg total) by mouth daily before breakfast. ?  ?Dexcom G6 Transmitter Misc ?Use one every 90 days ?  ?FreeStyle Libre 2 Sensor Misc ?2 Devices by Does not apply route  every 14 (fourteen) days. ?  ?glucose blood test strip ?Commonly known as: Banker Next Test ?Use as instructed to check blood sugar 3 times per day Dx code E11.65 ?  ?Insulin Pen Needle 32G X 4 MM Misc ?Use to inject insulin ?  ?lisinopril 10 MG tablet ?Commonly known as: ZESTRIL ?Take 10 mg by mouth daily. Reported on 03/08/2016 ?  ?lisinopril 40 MG tablet ?Commonly known as: ZESTRIL ?Take 40 mg by mouth daily. ?  ?multivitamin capsule ?Take 1 capsule by mouth daily. ?  ?NovoLOG Mix 70/30 FlexPen (70-30) 100 UNIT/ML FlexPen ?Generic drug: insulin aspart protamine - aspart ?Inject 35 units before breakfast and 45 units before supper ?  ?Ozempic (1 MG/DOSE) 2 MG/1.5ML Sopn ?Generic drug: Semaglutide (1 MG/DOSE) ?Inject 1 mg into the skin once a week. ?  ?sertraline 100 MG tablet ?Commonly known as: ZOLOFT ?Take 100 mg by mouth daily. ?  ?sildenafil 20 MG tablet ?Commonly known as: REVATIO ?Take 3-4 pills as needed before sexual activity ?  ? ?  ? ? ?Allergies:  ?Allergies  ?Allergen Reactions  ? Metformin And Related Diarrhea  ? ? ?Past Medical History:  ?Diagnosis Date  ? Diabetes mellitus without complication (HCC)   ? Hypertension   ? ? ?Past Surgical History:  ?Procedure Laterality Date  ? ANKLE SURGERY    ? CHOLECYSTECTOMY    ? OPEN REDUCTION INTERNAL FIXATION (ORIF) METACARPAL Right 06/02/2016  ? Procedure: OPEN REDUCTION INTERNAL FIXATION (ORIF) right ring phalanx and  METACARPAL fractures;  Surgeon: Betha Loa, MD;  Location: [facility];  Service: Orthopedics;  Laterality: Right;  OPEN REDUCTION INTERNAL FIXATION (ORIF) right ring phalanx and  METACARPAL fractures  ? TONSILLECTOMY    ? ? ?No family history on file. ? ?Social History:  reports that he has been smoking cigarettes. He has been smoking an average of .5 packs per day. He has never used smokeless tobacco. He reports current alcohol use. He reports current drug use. Drug: Marijuana. ? ? ?Review of Systems ? ? ?Lipid history:  Previously had been on atorvastatin from his PCP ?Last labs from PCP in 08/03/2020 show: ?Triglycerides 309, LDL 129 with HDL 48 ? ?  ?Lab Results  ?Component Value Date  ? CHOL 152 11/17/2021  ? HDL 43.00 03/

## 2021-11-22 NOTE — Patient Instructions (Addendum)
Novolog Mix 30 units before breakfast and 50 after dinner    ? ?Check blood sugars on waking up   ? ?Also check blood sugars about 2 hours after meals and do this after different meals by rotation ? ?Recommended blood sugar levels on waking up are 90-130 and about 2 hours after meal is 130-180 ? ?Please bring your blood sugar monitor to each visit, thank you ? ?Check if taking FARXIGA or jardiance ? ?

## 2021-11-23 ENCOUNTER — Encounter: Payer: Self-pay | Admitting: Endocrinology

## 2022-02-14 ENCOUNTER — Other Ambulatory Visit: Payer: Self-pay | Admitting: Endocrinology

## 2022-02-14 DIAGNOSIS — E1165 Type 2 diabetes mellitus with hyperglycemia: Secondary | ICD-10-CM

## 2022-07-18 ENCOUNTER — Other Ambulatory Visit: Payer: Self-pay | Admitting: Endocrinology

## 2022-08-29 ENCOUNTER — Other Ambulatory Visit (INDEPENDENT_AMBULATORY_CARE_PROVIDER_SITE_OTHER): Payer: BC Managed Care – PPO

## 2022-08-29 ENCOUNTER — Other Ambulatory Visit: Payer: BC Managed Care – PPO

## 2022-08-29 DIAGNOSIS — E1165 Type 2 diabetes mellitus with hyperglycemia: Secondary | ICD-10-CM | POA: Diagnosis not present

## 2022-08-29 DIAGNOSIS — R7309 Other abnormal glucose: Secondary | ICD-10-CM

## 2022-08-29 DIAGNOSIS — Z794 Long term (current) use of insulin: Secondary | ICD-10-CM

## 2022-08-29 LAB — BASIC METABOLIC PANEL
BUN: 19 mg/dL (ref 6–23)
CO2: 27 mEq/L (ref 19–32)
Calcium: 10.4 mg/dL (ref 8.4–10.5)
Chloride: 100 mEq/L (ref 96–112)
Creatinine, Ser: 1.18 mg/dL (ref 0.40–1.50)
GFR: 71.94 mL/min (ref >60.00)
Glucose, Bld: 275 mg/dL — ABNORMAL HIGH (ref 70–99)
Potassium: 4.4 mEq/L (ref 3.5–5.1)
Sodium: 137 mEq/L (ref 135–145)

## 2022-08-29 LAB — MICROALBUMIN / CREATININE URINE RATIO
Creatinine,U: 31.9 mg/dL
Microalb Creat Ratio: 59.7 mg/g — ABNORMAL HIGH (ref 0.0–30.0)
Microalb, Ur: 19.1 mg/dL — ABNORMAL HIGH (ref 0.0–1.9)

## 2022-08-29 LAB — HEMOGLOBIN A1C: Hgb A1c MFr Bld: 10.5 % — ABNORMAL HIGH (ref 4.6–6.5)

## 2022-08-30 LAB — HEMOGLOBIN A1C
Est. average glucose Bld gHb Est-mCnc: 255 mg/dL
Hgb A1c MFr Bld: 10.5 % — ABNORMAL HIGH (ref 4.8–5.6)

## 2022-08-31 ENCOUNTER — Other Ambulatory Visit: Payer: Self-pay

## 2022-08-31 ENCOUNTER — Encounter: Payer: Self-pay | Admitting: Endocrinology

## 2022-08-31 ENCOUNTER — Ambulatory Visit (INDEPENDENT_AMBULATORY_CARE_PROVIDER_SITE_OTHER): Payer: BC Managed Care – PPO | Admitting: Endocrinology

## 2022-08-31 VITALS — BP 142/82 | HR 107 | Ht 78.5 in | Wt 278.0 lb

## 2022-08-31 DIAGNOSIS — R809 Proteinuria, unspecified: Secondary | ICD-10-CM

## 2022-08-31 DIAGNOSIS — E1129 Type 2 diabetes mellitus with other diabetic kidney complication: Secondary | ICD-10-CM | POA: Diagnosis not present

## 2022-08-31 DIAGNOSIS — E1169 Type 2 diabetes mellitus with other specified complication: Secondary | ICD-10-CM | POA: Diagnosis not present

## 2022-08-31 DIAGNOSIS — Z794 Long term (current) use of insulin: Secondary | ICD-10-CM

## 2022-08-31 DIAGNOSIS — N521 Erectile dysfunction due to diseases classified elsewhere: Secondary | ICD-10-CM

## 2022-08-31 DIAGNOSIS — E1165 Type 2 diabetes mellitus with hyperglycemia: Secondary | ICD-10-CM | POA: Diagnosis not present

## 2022-08-31 MED ORDER — DAPAGLIFLOZIN PROPANEDIOL 10 MG PO TABS: 10.0000 mg | ORAL_TABLET | Freq: Every day | ORAL | 3 refills | Status: DC

## 2022-08-31 MED ORDER — SEMAGLUTIDE(0.25 OR 0.5MG/DOS) 2 MG/3ML ~~LOC~~ SOPN: PEN_INJECTOR | SUBCUTANEOUS | 0 refills | Status: DC

## 2022-08-31 MED ORDER — INSULIN ASP PROT & ASP FLEXPEN (70-30) 100 UNIT/ML ~~LOC~~ SUPN: PEN_INJECTOR | SUBCUTANEOUS | 1 refills | Status: DC

## 2022-08-31 MED ORDER — INSULIN PEN NEEDLE 32G X 4 MM MISC: 3 refills | Status: DC

## 2022-08-31 NOTE — Patient Instructions (Addendum)
Take 30 units of the NovoLog mix at breakfast but go up to 50 units at dinnertime  Check blood sugars on waking up   Also check blood sugars about 2 hours after meals and do this after different meals by rotation  Recommended blood sugar levels on waking up are 90-130 and about 2 hours after meal is 130-160  Please bring your blood sugar monitor to each visit, thank you  Schedule eye exam

## 2022-08-31 NOTE — Progress Notes (Signed)
Patient ID: Lawrence Fisher, male   DOB: 1971-02-16, 52 y.o.   MRN: [id number]          Reason for Appointment: Follow-up for Type 2 Diabetes    History of Present Illness:          Date of diagnosis of type 2 diabetes mellitus:2015        Background history:   In 2015  he was having symptoms of weakness and increased thirst and was found to have glucose of about 500 in emergency room. He was initially treated with metformin 500 mg twice a day only Although his blood sugars improved significantly initially they were higher subsequently  He started Bydureon in 2016 Appears to have had persistently poor control with A1c 11% in 12/16 Also A1c in 3/21 was higher at 11.4 compared to 9.0 the previous year  Recent history:   His A1c is 10.5  INSULIN regimen is:  Novolog Mix 35 units before breakfast and 45 at  dinner     Non-insulin hypoglycemic drugs the patient is taking are:?  Farxiga, was on Ozempic 1 mg weekly  Current management, blood sugar patterns and problems identified: He has not been seen since 4/23 Although he is taking insulin sporadically he has not been regular on this at all and is running out of his prescription  Also has not taken Ozempic or [place] for a few months He still does not monitor his blood sugars and did not bring his freestyle libre sensor to be started  He says that without Ozempic he is tending to eat more in the evenings and recently has gained weight, previously had been losing weight Has complaints of frequent urination and fatigue Fasting glucose was 275        Side effects from medications have been: Diarrhea from Metformin     Typical meal intake: Breakfast is skipped or oatmeal or grits, eggs or sausage or [place] croissant.  Occasionally eating fast food Has irregular mealtimes especially lunch               Glucose monitoring: Irregular glucometer:  Contour    Blood Glucose readings by recall   225 one morning and 175 in the  evening .  Dietician visit, most recent: Never  Weight history:  Wt Readings from Last 3 Encounters:  08/31/22 278 lb (126.1 kg)  11/22/21 278 lb 3.2 oz (126.2 kg)  09/10/21 298 lb 12.8 oz (135.5 kg)      Glycemic control:   Lab Results  Component Value Date   HGBA1C 10.5 (H) 08/29/2022   HGBA1C 10.5 (H) 08/29/2022   HGBA1C 8.2 (H) 11/17/2021   Lab Results  Component Value Date   MICROALBUR 19.1 (H) 08/29/2022   LDLCALC 77 11/17/2021   CREATININE 1.18 08/29/2022   Lab Results  Component Value Date   MICRALBCREAT 59.7 (H) 08/29/2022   Last creatinine 1.04  Lab Results  Component Value Date   FRUCTOSAMINE 333 (H) 09/06/2021   FRUCTOSAMINE 389 (H) 12/15/2020   FRUCTOSAMINE 253 01/19/2017    Lab on 08/29/2022  Component Date Value Ref Range Status   Hgb A1c MFr Bld 08/29/2022 10.5 (H)  4.8 - 5.6 % Final   Comment:          Prediabetes: 5.7 - 6.4          Diabetes: >6.4          Glycemic control for adults with diabetes: <7.0    Est. average glucose Bld gHb  Est-m* 08/29/2022 255  mg/dL Final  Lab on 47/82/9562  Component Date Value Ref Range Status   Microalb, Ur 08/29/2022 19.1 (H)  0.0 - 1.9 mg/dL Final   Creatinine,U 13/03/6577 31.9  mg/dL Final   Microalb Creat Ratio 08/29/2022 59.7 (H)  0.0 - 30.0 mg/g Final   Sodium 08/29/2022 137  135 - 145 mEq/L Final   Potassium 08/29/2022 4.4  3.5 - 5.1 mEq/L Final   Chloride 08/29/2022 100  96 - 112 mEq/L Final   CO2 08/29/2022 27  19 - 32 mEq/L Final   Glucose, Bld 08/29/2022 275 (H)  70 - 99 mg/dL Final   BUN 46/96/2952 19  6 - 23 mg/dL Final   Creatinine, Ser 08/29/2022 1.18  0.40 - 1.50 mg/dL Final   GFR 84/13/2440 71.94  >60.00 mL/min Final   Calculated using the CKD-EPI Creatinine Equation (2021)   Calcium 08/29/2022 10.4  8.4 - 10.5 mg/dL Final   Hgb N0U MFr Bld 08/29/2022 10.5 (H)  4.6 - 6.5 % Final   Glycemic Control Guidelines for People with Diabetes:Non Diabetic:  <6%Goal of Therapy: <7%Additional  Action Suggested:  >8%     Allergies as of 08/31/2022       Reactions   Metformin And Related Diarrhea        Medication List        Accurate as of August 31, 2022  9:24 AM. If you have any questions, ask your nurse or doctor.          amLODipine 10 MG tablet Commonly known as: NORVASC Take by mouth.   Bayer Microlet Lancets lancets Use as instructed to check blood sugar 3 times per day dx code E11.65   dapagliflozin propanediol 10 MG Tabs tablet Commonly known as: Farxiga Take 1 tablet (10 mg total) by mouth daily before breakfast.   Dexcom G6 Transmitter Misc Use one every 90 days   FreeStyle Libre 2 Sensor Misc 2 Devices by Does not apply route every 14 (fourteen) days.   glucose blood test strip Commonly known as: Banker Next Test Use as instructed to check blood sugar 3 times per day Dx code E11.65   Insulin Asp Prot & Asp FlexPen (70-30) 100 UNIT/ML FlexPen Commonly known as: NOVOLOG 70/30 MIX INJECT 35 UNITS BEFORE BREAKFAST AND 45 UNITS BEFORE SUPPER   Insulin Pen Needle 32G X 4 MM Misc Use to inject insulin   lisinopril 10 MG tablet Commonly known as: ZESTRIL Take 10 mg by mouth daily. Reported on 03/08/2016   lisinopril 40 MG tablet Commonly known as: ZESTRIL Take 40 mg by mouth daily.   multivitamin capsule Take 1 capsule by mouth daily.   Ozempic (1 MG/DOSE) 2 MG/1.5ML Sopn Generic drug: Semaglutide (1 MG/DOSE) Inject 1 mg into the skin once a week. What changed: Another medication with the same name was added. Make sure you understand how and when to take each. Changed by: Reather Littler, MD   Semaglutide(0.25 or 0.5MG /DOS) 2 MG/3ML Sopn Inject 0.5mg  weekly What changed: You were already taking a medication with the same name, and this prescription was added. Make sure you understand how and when to take each. Changed by: Reather Littler, MD   sertraline 100 MG tablet Commonly known as: ZOLOFT Take 100 mg by mouth daily.    sildenafil 20 MG tablet Commonly known as: REVATIO Take 3-4 pills as needed before sexual activity        Allergies:  Allergies  Allergen Reactions   Metformin And  Related Diarrhea    Past Medical History:  Diagnosis Date   Diabetes mellitus without complication ([place])    Hypertension     Past Surgical History:  Procedure Laterality Date   ANKLE SURGERY     CHOLECYSTECTOMY     OPEN REDUCTION INTERNAL FIXATION (ORIF) METACARPAL Right 06/02/2016   Procedure: OPEN REDUCTION INTERNAL FIXATION (ORIF) right ring phalanx and  METACARPAL fractures;  Surgeon: Leanora Cover, MD;  Location: [facility];  Service: Orthopedics;  Laterality: Right;  OPEN REDUCTION INTERNAL FIXATION (ORIF) right ring phalanx and  METACARPAL fractures   TONSILLECTOMY      No family history on file.  Social History:  reports that he has been smoking cigarettes. He has been smoking an average of .5 packs per day. He has never used smokeless tobacco. He reports current alcohol use. He reports current drug use. Drug: Marijuana.   Review of Systems   Lipid history: Previously had been on atorvastatin from his PCP    Lab Results  Component Value Date   CHOL 152 11/17/2021   HDL 43.00 11/17/2021   LDLCALC 77 11/17/2021   TRIG 160.0 (H) 11/17/2021   CHOLHDL 4 11/17/2021           Hypertension: Has been present and is prescribed lisinopril and amlodipine from his PCP Not clear if he is taking any of these  BP Readings from Last 3 Encounters:  08/31/22 (!) 142/82  11/22/21 138/72  09/10/21 138/86    Most recent eye exam was a few years ago and still made a follow-up  Most recent Fisher exam: 09/2020  Currently known complications of diabetes: Neuropathy, erectile dysfunction  He has been prescribed sildenafil for ED but he said that this did not work  Has persistent chronic diarrhea ? IBS  LABS:  Lab on 08/29/2022  Component Date Value Ref Range Status   Hgb A1c MFr Bld  08/29/2022 10.5 (H)  4.8 - 5.6 % Final   Comment:          Prediabetes: 5.7 - 6.4          Diabetes: >6.4          Glycemic control for adults with diabetes: <7.0    Est. average glucose Bld gHb Est-m* 08/29/2022 255  mg/dL Final  Lab on 08/29/2022  Component Date Value Ref Range Status   Microalb, Ur 08/29/2022 19.1 (H)  0.0 - 1.9 mg/dL Final   Creatinine,U 08/29/2022 31.9  mg/dL Final   Microalb Creat Ratio 08/29/2022 59.7 (H)  0.0 - 30.0 mg/g Final   Sodium 08/29/2022 137  135 - 145 mEq/L Final   Potassium 08/29/2022 4.4  3.5 - 5.1 mEq/L Final   Chloride 08/29/2022 100  96 - 112 mEq/L Final   CO2 08/29/2022 27  19 - 32 mEq/L Final   Glucose, Bld 08/29/2022 275 (H)  70 - 99 mg/dL Final   BUN 08/29/2022 19  6 - 23 mg/dL Final   Creatinine, Ser 08/29/2022 1.18  0.40 - 1.50 mg/dL Final   GFR 08/29/2022 71.94  >60.00 mL/min Final   Calculated using the CKD-EPI Creatinine Equation (2021)   Calcium 08/29/2022 10.4  8.4 - 10.5 mg/dL Final   Hgb A1c MFr Bld 08/29/2022 10.5 (H)  4.6 - 6.5 % Final   Glycemic Control Guidelines for People with Diabetes:Non Diabetic:  <6%Goal of Therapy: <7%Additional Action Suggested:  >8%     Physical Examination:  BP (!) 142/82   Pulse (!) 107  Ht 6' 6.5" (1.994 m)   Wt 278 lb (126.1 kg)   SpO2 98%   BMI 31.72 kg/m       ASSESSMENT:  Diabetes type 2 on insulin  See history of present illness for detailed discussion of current diabetes management, blood sugar patterns and problems identified  Recommended treatment regimen is 70/30 insulin twice a day, Jardiance and Ozempic  His A1c is significantly higher at 10.5  With his being out of his medications and only sporadically taking insulin his blood sugars are poorly controlled However has lost weight possibly from hyperglycemia Subjectively not doing well with fatigue and increased urination Currently not monitoring his blood sugars also   HYPERTENSION: Blood pressure is minimally  increased but not clear if he is taking lisinopril which would be important to take since he has microalbuminuria  PLAN:     Recommendations  He will take 30 units of the NovoLog mix at breakfast and 50 units at dinnertime Discussed importance of taking evening insulin before starting to eat To call if he is having any tendency to hypoglycemia Needs to start checking blood sugars on he can set up an appointment with the diabetes educator to get the freestyle libre started [employer] and Ozempic However Ozempic will be started at 0.5 mg for the first month and then go to 1 mg More regular follow-up Discussed blood sugar targets at various times Start regular walking for exercise Discussed A1c target of 7%, fasting glucose of under 130 at least and postprandial readings at different meals under 180  Likely needs his PCP to refer him to a urologist for ED since sildenafil does not appear to be working  Also he needs to discuss other issues with PCP including possible referral to neurology for memory difficulties  Patient Instructions  Take 30 units of the NovoLog mix at breakfast but go up to 50 units at dinnertime  Check blood sugars on waking up   Also check blood sugars about 2 hours after meals and do this after different meals by rotation  Recommended blood sugar levels on waking up are 90-130 and about 2 hours after meal is 130-160  Please bring your blood sugar monitor to each visit, thank you  Schedule eye exam  Total visit time including counseling time = 30 minutes    Elayne Snare 08/31/2022, 9:24 AM   Note: This office note was prepared with Dragon voice recognition system technology. Any transcriptional errors that result from this process are unintentional.

## 2022-09-07 ENCOUNTER — Other Ambulatory Visit: Payer: Self-pay | Admitting: Endocrinology

## 2022-10-02 ENCOUNTER — Ambulatory Visit
Admission: EM | Admit: 2022-10-02 | Discharge: 2022-10-02 | Discharge status: Absent without leave | Payer: BC Managed Care – PPO

## 2022-11-04 DIAGNOSIS — M79671 Pain in right foot: Secondary | ICD-10-CM | POA: Diagnosis not present

## 2022-11-04 DIAGNOSIS — E11621 Type 2 diabetes mellitus with foot ulcer: Secondary | ICD-10-CM | POA: Diagnosis not present

## 2022-11-04 DIAGNOSIS — L97411 Non-pressure chronic ulcer of right heel and midfoot limited to breakdown of skin: Secondary | ICD-10-CM | POA: Diagnosis not present

## 2022-11-04 DIAGNOSIS — Z794 Long term (current) use of insulin: Secondary | ICD-10-CM | POA: Diagnosis not present

## 2022-11-04 DIAGNOSIS — L97519 Non-pressure chronic ulcer of other part of right foot with unspecified severity: Secondary | ICD-10-CM | POA: Diagnosis not present

## 2022-11-11 DIAGNOSIS — L089 Local infection of the skin and subcutaneous tissue, unspecified: Secondary | ICD-10-CM | POA: Diagnosis not present

## 2022-11-11 DIAGNOSIS — E1140 Type 2 diabetes mellitus with diabetic neuropathy, unspecified: Secondary | ICD-10-CM | POA: Diagnosis not present

## 2022-11-11 DIAGNOSIS — E1159 Type 2 diabetes mellitus with other circulatory complications: Secondary | ICD-10-CM | POA: Diagnosis not present

## 2022-11-11 DIAGNOSIS — Z1330 Encounter for screening examination for mental health and behavioral disorders, unspecified: Secondary | ICD-10-CM | POA: Diagnosis not present

## 2022-11-11 DIAGNOSIS — E1149 Type 2 diabetes mellitus with other diabetic neurological complication: Secondary | ICD-10-CM | POA: Diagnosis not present

## 2022-11-11 DIAGNOSIS — F331 Major depressive disorder, recurrent, moderate: Secondary | ICD-10-CM | POA: Diagnosis not present

## 2022-11-11 DIAGNOSIS — E11628 Type 2 diabetes mellitus with other skin complications: Secondary | ICD-10-CM | POA: Diagnosis not present

## 2022-11-11 DIAGNOSIS — I152 Hypertension secondary to endocrine disorders: Secondary | ICD-10-CM | POA: Diagnosis not present

## 2022-11-18 DIAGNOSIS — E1165 Type 2 diabetes mellitus with hyperglycemia: Secondary | ICD-10-CM | POA: Diagnosis not present

## 2022-11-18 DIAGNOSIS — E11621 Type 2 diabetes mellitus with foot ulcer: Secondary | ICD-10-CM | POA: Diagnosis not present

## 2022-11-18 DIAGNOSIS — L97512 Non-pressure chronic ulcer of other part of right foot with fat layer exposed: Secondary | ICD-10-CM | POA: Diagnosis not present

## 2022-11-18 DIAGNOSIS — L97511 Non-pressure chronic ulcer of other part of right foot limited to breakdown of skin: Secondary | ICD-10-CM

## 2022-11-18 DIAGNOSIS — E1140 Type 2 diabetes mellitus with diabetic neuropathy, unspecified: Secondary | ICD-10-CM | POA: Diagnosis not present

## 2022-11-18 DIAGNOSIS — M2041 Other hammer toe(s) (acquired), right foot: Secondary | ICD-10-CM | POA: Insufficient documentation

## 2022-11-18 HISTORY — DX: Type 2 diabetes mellitus with diabetic neuropathy, unspecified: E11.40

## 2022-11-18 HISTORY — DX: Other hammer toe(s) (acquired), right foot: M20.41

## 2022-11-18 HISTORY — DX: Non-pressure chronic ulcer of other part of right foot limited to breakdown of skin: L97.511

## 2022-11-21 ENCOUNTER — Other Ambulatory Visit: Payer: Self-pay | Admitting: Endocrinology

## 2022-12-09 ENCOUNTER — Other Ambulatory Visit: Payer: BC Managed Care – PPO

## 2022-12-19 ENCOUNTER — Ambulatory Visit: Payer: BC Managed Care – PPO | Admitting: Endocrinology

## 2022-12-19 DIAGNOSIS — L97512 Non-pressure chronic ulcer of other part of right foot with fat layer exposed: Secondary | ICD-10-CM | POA: Diagnosis not present

## 2022-12-19 DIAGNOSIS — L84 Corns and callosities: Secondary | ICD-10-CM | POA: Diagnosis not present

## 2022-12-19 DIAGNOSIS — E1140 Type 2 diabetes mellitus with diabetic neuropathy, unspecified: Secondary | ICD-10-CM | POA: Diagnosis not present

## 2022-12-19 DIAGNOSIS — E11621 Type 2 diabetes mellitus with foot ulcer: Secondary | ICD-10-CM | POA: Diagnosis not present

## 2022-12-19 HISTORY — DX: Corns and callosities: L84

## 2023-01-06 ENCOUNTER — Other Ambulatory Visit: Payer: BC Managed Care – PPO

## 2023-01-09 ENCOUNTER — Other Ambulatory Visit: Payer: BC Managed Care – PPO

## 2023-01-09 DIAGNOSIS — Z7985 Long-term (current) use of injectable non-insulin antidiabetic drugs: Secondary | ICD-10-CM | POA: Diagnosis not present

## 2023-01-09 DIAGNOSIS — Z8631 Personal history of diabetic foot ulcer: Secondary | ICD-10-CM | POA: Diagnosis not present

## 2023-01-09 DIAGNOSIS — L84 Corns and callosities: Secondary | ICD-10-CM | POA: Diagnosis not present

## 2023-01-09 DIAGNOSIS — E1165 Type 2 diabetes mellitus with hyperglycemia: Secondary | ICD-10-CM | POA: Diagnosis not present

## 2023-01-09 HISTORY — DX: Personal history of diabetic foot ulcer: Z86.31

## 2023-01-10 ENCOUNTER — Other Ambulatory Visit: Payer: BC Managed Care – PPO

## 2023-01-11 ENCOUNTER — Ambulatory Visit: Payer: BC Managed Care – PPO | Admitting: Endocrinology

## 2023-01-18 ENCOUNTER — Other Ambulatory Visit: Payer: Self-pay | Admitting: Endocrinology

## 2023-02-16 ENCOUNTER — Other Ambulatory Visit (HOSPITAL_COMMUNITY): Payer: Self-pay

## 2023-03-27 DIAGNOSIS — E11621 Type 2 diabetes mellitus with foot ulcer: Secondary | ICD-10-CM | POA: Diagnosis not present

## 2023-03-27 DIAGNOSIS — M6701 Short Achilles tendon (acquired), right ankle: Secondary | ICD-10-CM | POA: Diagnosis not present

## 2023-03-27 DIAGNOSIS — E1165 Type 2 diabetes mellitus with hyperglycemia: Secondary | ICD-10-CM | POA: Diagnosis not present

## 2023-03-27 DIAGNOSIS — L97512 Non-pressure chronic ulcer of other part of right foot with fat layer exposed: Secondary | ICD-10-CM | POA: Diagnosis not present

## 2023-03-27 HISTORY — DX: Short Achilles tendon (acquired), right ankle: M67.01

## 2023-03-28 DIAGNOSIS — F339 Major depressive disorder, recurrent, unspecified: Secondary | ICD-10-CM | POA: Diagnosis not present

## 2023-03-28 DIAGNOSIS — F4310 Post-traumatic stress disorder, unspecified: Secondary | ICD-10-CM | POA: Diagnosis not present

## 2023-03-28 DIAGNOSIS — F1210 Cannabis abuse, uncomplicated: Secondary | ICD-10-CM | POA: Diagnosis not present

## 2023-03-28 DIAGNOSIS — F1010 Alcohol abuse, uncomplicated: Secondary | ICD-10-CM | POA: Diagnosis not present

## 2023-04-04 DIAGNOSIS — F4310 Post-traumatic stress disorder, unspecified: Secondary | ICD-10-CM | POA: Diagnosis not present

## 2023-04-04 DIAGNOSIS — F1210 Cannabis abuse, uncomplicated: Secondary | ICD-10-CM | POA: Diagnosis not present

## 2023-04-04 DIAGNOSIS — F339 Major depressive disorder, recurrent, unspecified: Secondary | ICD-10-CM | POA: Diagnosis not present

## 2023-04-04 DIAGNOSIS — F1010 Alcohol abuse, uncomplicated: Secondary | ICD-10-CM | POA: Diagnosis not present

## 2023-04-10 DIAGNOSIS — E1165 Type 2 diabetes mellitus with hyperglycemia: Secondary | ICD-10-CM | POA: Diagnosis not present

## 2023-04-10 DIAGNOSIS — L97511 Non-pressure chronic ulcer of other part of right foot limited to breakdown of skin: Secondary | ICD-10-CM | POA: Diagnosis not present

## 2023-04-10 DIAGNOSIS — E11621 Type 2 diabetes mellitus with foot ulcer: Secondary | ICD-10-CM | POA: Diagnosis not present

## 2023-04-10 DIAGNOSIS — M6701 Short Achilles tendon (acquired), right ankle: Secondary | ICD-10-CM | POA: Diagnosis not present

## 2023-04-12 ENCOUNTER — Other Ambulatory Visit: Payer: Self-pay

## 2023-04-13 ENCOUNTER — Encounter (HOSPITAL_BASED_OUTPATIENT_CLINIC_OR_DEPARTMENT_OTHER): Payer: Self-pay | Admitting: Emergency Medicine

## 2023-04-13 ENCOUNTER — Other Ambulatory Visit: Payer: Self-pay

## 2023-04-13 ENCOUNTER — Emergency Department (HOSPITAL_BASED_OUTPATIENT_CLINIC_OR_DEPARTMENT_OTHER)
Admission: EM | Admit: 2023-04-13 | Discharge: 2023-04-14 | Disposition: A | Discharge status: Home / Self Care | Payer: BC Managed Care – PPO | Attending: Emergency Medicine | Admitting: Emergency Medicine

## 2023-04-13 DIAGNOSIS — Z7984 Long term (current) use of oral hypoglycemic drugs: Secondary | ICD-10-CM | POA: Insufficient documentation

## 2023-04-13 DIAGNOSIS — Z794 Long term (current) use of insulin: Secondary | ICD-10-CM | POA: Diagnosis not present

## 2023-04-13 DIAGNOSIS — L02412 Cutaneous abscess of left axilla: Secondary | ICD-10-CM | POA: Insufficient documentation

## 2023-04-13 DIAGNOSIS — E119 Type 2 diabetes mellitus without complications: Secondary | ICD-10-CM | POA: Insufficient documentation

## 2023-04-13 LAB — CBG MONITORING, ED: Glucose-Capillary: 257 mg/dL — ABNORMAL HIGH (ref 70–99)

## 2023-04-13 MED ORDER — LIDOCAINE-EPINEPHRINE (PF) 2 %-1:200000 IJ SOLN
10.0000 mL | Freq: Once | INTRAMUSCULAR | Status: AC
  Administered 2023-04-14: 10 mL
  Filled 2023-04-13: qty 20

## 2023-04-13 NOTE — ED Triage Notes (Signed)
Abscess under left arm since Saturday. Using warm compresses at home with no improvement. H/o diabetes.

## 2023-04-13 NOTE — ED Provider Notes (Signed)
[place] [facility] AT [facility]  Provider Note  CSN: [id number] Arrival date & time: 04/13/23 2137  History Chief Complaint  Patient presents with   Abscess    Lawrence Fisher is a 52 y.o. male with history of DM reports an abscess in his L axilla worsening for the last week. Complaining of pain, no fever. Tried warm compresses without improvement. Has history of same.    Home Medications Prior to Admission medications   Medication Sig Start Date End Date Taking? Authorizing Provider  doxycycline (VIBRAMYCIN) 100 MG capsule Take 1 capsule (100 mg total) by mouth 2 (two) times daily. 04/14/23  Yes Pollyann Savoy, MD  amLODipine (NORVASC) 10 MG tablet Take by mouth. 06/30/20   [provider]  BAYER MICROLET LANCETS lancets Use as instructed to check blood sugar 3 times per day dx code E11.65 12/29/15   Reather Littler, MD  Continuous Blood Gluc Sensor (FREESTYLE LIBRE 2 SENSOR) MISC USE EVERY 14 DAYS 09/08/22   Reather Littler, MD  Continuous Blood Gluc Transmit (DEXCOM G6 TRANSMITTER) MISC Use one every 90 days Patient not taking: Reported on 09/10/2021 10/30/20   Reather Littler, MD  dapagliflozin propanediol (FARXIGA) 10 MG TABS tablet Take 1 tablet (10 mg total) by mouth daily before breakfast. 08/31/22   Reather Littler, MD  glucose blood (BAYER CONTOUR NEXT TEST) test strip Use as instructed to check blood sugar 3 times per day Dx code E11.65 06/02/21   Reather Littler, MD  Insulin Asp Prot & Asp FlexPen (NOVOLOG 70/30 MIX) (70-30) 100 UNIT/ML FlexPen INJECT 35 UNITS BEFORE BREAKFAST AND 45 UNITS BEFORE SUPPER 08/31/22   Reather Littler, MD  Insulin Pen Needle 32G X 4 MM MISC Use to inject insulin 08/31/22   Reather Littler, MD  lisinopril (PRINIVIL,ZESTRIL) 10 MG tablet Take 10 mg by mouth daily. Reported on 03/08/2016    [provider]  lisinopril (ZESTRIL) 40 MG tablet Take 40 mg by mouth daily.    [provider]  Multiple Vitamin (MULTIVITAMIN) capsule Take 1  capsule by mouth daily.    [provider]  OZEMPIC, 0.25 OR 0.5 MG/DOSE, 2 MG/3ML SOPN INJECT 0.5 MG WEEKLY 01/19/23   Reather Littler, MD  Semaglutide, 1 MG/DOSE, (OZEMPIC, 1 MG/DOSE,) 2 MG/1.5ML SOPN Inject 1 mg into the skin once a week. 09/10/21   Reather Littler, MD  sertraline (ZOLOFT) 100 MG tablet Take 100 mg by mouth daily.    [provider]  sildenafil (REVATIO) 20 MG tablet Take 3-4 pills as needed before sexual activity 06/02/21   Reather Littler, MD     Allergies    Metformin and Metformin and related   Review of Systems   Review of Systems Please see HPI for pertinent positives and negatives  Physical Exam BP (!) 145/89 (BP Location: Left Arm)   Pulse (!) 119   Temp 98.4 F (36.9 C) (Oral)   Resp 16   Ht 6' 6.5" (1.994 m)   Wt 126.1 kg   SpO2 95%   BMI 31.72 kg/m   Physical Exam Vitals and nursing note reviewed.  HENT:     Head: Normocephalic.     Nose: Nose normal.  Eyes:     Extraocular Movements: Extraocular movements intact.  Pulmonary:     Effort: Pulmonary effort is normal.  Musculoskeletal:        General: Normal range of motion.     Cervical back: Neck supple.     Comments: Large abscess  in L axilla with central fluctuance  Skin:    Findings: No rash (on exposed skin).  Neurological:     Mental Status: He is alert and oriented to person, place, and time.  Psychiatric:        Mood and Affect: Mood normal.     ED Results / Procedures / Treatments   EKG None  Procedures .Marland KitchenIncision and Drainage  Date/Time: 04/14/2023 12:25 AM  Performed by: Pollyann Savoy, MD Authorized by: Pollyann Savoy, MD   Consent:    Consent obtained:  Verbal   Consent given by:  Patient Universal protocol:    Patient identity confirmed:  Verbally with patient Location:    Type:  Abscess   Location:  Upper extremity   Upper extremity location: L axilla. Pre-procedure details:    Skin preparation:  Chlorhexidine with alcohol Sedation:     Sedation type:  None Anesthesia:    Anesthesia method:  Local infiltration   Local anesthetic:  Lidocaine 2% WITH epi Procedure type:    Complexity:  Complex Procedure details:    Incision types:  Single straight   Incision depth:  Dermal   Wound management:  Probed and deloculated   Drainage:  Purulent   Drainage amount:  Copious   Wound treatment:  Drain placed   Packing materials:  1/2 in iodoform gauze Post-procedure details:    Procedure completion:  Tolerated well, no immediate complications   Medications Ordered in the ED Medications  lidocaine-EPINEPHrine (XYLOCAINE W/EPI) 2 %-1:200000 (PF) injection 10 mL (has no administration in time range)  doxycycline (VIBRA-TABS) tablet 100 mg (has no administration in time range)    Initial Impression and Plan  Superficial abscess drained as above. Wound care instructions given, PCP follow up, RTED for wound check and possible repacking in 2 days.   ED Course   Clinical Course as of 04/14/23 0026  Thu Apr 13, 2023  2353 CBG is mildly elevated, patient has his medications at home does not need refills, although he has not taken his nighttime doses yet.  [CS]    Clinical Course User Index [CS] Pollyann Savoy, MD     MDM Rules/Calculators/A&P Medical Decision Making Problems Addressed: Abscess of left axilla: acute illness or injury  Amount and/or Complexity of Data Reviewed Labs: ordered. Decision-making details documented in ED Course.  Risk Prescription drug management.     Final Clinical Impression(s) / ED Diagnoses Final diagnoses:  Abscess of left axilla    Rx / DC Orders ED Discharge Orders          Ordered    doxycycline (VIBRAMYCIN) 100 MG capsule  2 times daily        04/14/23 0026             Pollyann Savoy, MD 04/14/23 [phone]

## 2023-04-14 MED ORDER — DOXYCYCLINE HYCLATE 100 MG PO CAPS: 100.0000 mg | ORAL_CAPSULE | Freq: Two times a day (BID) | ORAL | 0 refills | Status: DC

## 2023-04-14 MED ORDER — DOXYCYCLINE HYCLATE 100 MG PO TABS
100.0000 mg | ORAL_TABLET | Freq: Once | ORAL | Status: AC
  Administered 2023-04-14: 100 mg via ORAL
  Filled 2023-04-14: qty 1

## 2023-04-19 DIAGNOSIS — F339 Major depressive disorder, recurrent, unspecified: Secondary | ICD-10-CM | POA: Diagnosis not present

## 2023-04-19 DIAGNOSIS — F4310 Post-traumatic stress disorder, unspecified: Secondary | ICD-10-CM | POA: Diagnosis not present

## 2023-04-19 DIAGNOSIS — F1210 Cannabis abuse, uncomplicated: Secondary | ICD-10-CM | POA: Diagnosis not present

## 2023-04-19 DIAGNOSIS — F1010 Alcohol abuse, uncomplicated: Secondary | ICD-10-CM | POA: Diagnosis not present

## 2023-05-03 DIAGNOSIS — L97511 Non-pressure chronic ulcer of other part of right foot limited to breakdown of skin: Secondary | ICD-10-CM | POA: Diagnosis not present

## 2023-05-03 DIAGNOSIS — E11621 Type 2 diabetes mellitus with foot ulcer: Secondary | ICD-10-CM | POA: Diagnosis not present

## 2023-05-03 DIAGNOSIS — E1140 Type 2 diabetes mellitus with diabetic neuropathy, unspecified: Secondary | ICD-10-CM | POA: Diagnosis not present

## 2023-05-03 DIAGNOSIS — M2041 Other hammer toe(s) (acquired), right foot: Secondary | ICD-10-CM | POA: Diagnosis not present

## 2023-05-18 ENCOUNTER — Emergency Department (HOSPITAL_BASED_OUTPATIENT_CLINIC_OR_DEPARTMENT_OTHER): Payer: BC Managed Care – PPO

## 2023-05-18 ENCOUNTER — Encounter (HOSPITAL_BASED_OUTPATIENT_CLINIC_OR_DEPARTMENT_OTHER): Payer: Self-pay | Admitting: Emergency Medicine

## 2023-05-18 ENCOUNTER — Other Ambulatory Visit: Payer: Self-pay

## 2023-05-18 ENCOUNTER — Emergency Department (HOSPITAL_BASED_OUTPATIENT_CLINIC_OR_DEPARTMENT_OTHER)
Admission: EM | Admit: 2023-05-18 | Discharge: 2023-05-18 | Discharge status: Against Medical Advice | Payer: BC Managed Care – PPO | Attending: Emergency Medicine | Admitting: Emergency Medicine

## 2023-05-18 DIAGNOSIS — F17210 Nicotine dependence, cigarettes, uncomplicated: Secondary | ICD-10-CM | POA: Insufficient documentation

## 2023-05-18 DIAGNOSIS — Z7984 Long term (current) use of oral hypoglycemic drugs: Secondary | ICD-10-CM | POA: Diagnosis not present

## 2023-05-18 DIAGNOSIS — R0602 Shortness of breath: Secondary | ICD-10-CM | POA: Diagnosis not present

## 2023-05-18 DIAGNOSIS — R918 Other nonspecific abnormal finding of lung field: Secondary | ICD-10-CM | POA: Diagnosis not present

## 2023-05-18 DIAGNOSIS — Z79899 Other long term (current) drug therapy: Secondary | ICD-10-CM | POA: Diagnosis not present

## 2023-05-18 DIAGNOSIS — E119 Type 2 diabetes mellitus without complications: Secondary | ICD-10-CM | POA: Insufficient documentation

## 2023-05-18 DIAGNOSIS — J029 Acute pharyngitis, unspecified: Secondary | ICD-10-CM | POA: Diagnosis not present

## 2023-05-18 DIAGNOSIS — Z1152 Encounter for screening for COVID-19: Secondary | ICD-10-CM | POA: Diagnosis not present

## 2023-05-18 DIAGNOSIS — F1290 Cannabis use, unspecified, uncomplicated: Secondary | ICD-10-CM | POA: Insufficient documentation

## 2023-05-18 DIAGNOSIS — Z794 Long term (current) use of insulin: Secondary | ICD-10-CM | POA: Insufficient documentation

## 2023-05-18 DIAGNOSIS — E1159 Type 2 diabetes mellitus with other circulatory complications: Secondary | ICD-10-CM | POA: Diagnosis not present

## 2023-05-18 DIAGNOSIS — R079 Chest pain, unspecified: Secondary | ICD-10-CM | POA: Diagnosis not present

## 2023-05-18 DIAGNOSIS — I152 Hypertension secondary to endocrine disorders: Secondary | ICD-10-CM | POA: Diagnosis not present

## 2023-05-18 DIAGNOSIS — B370 Candidal stomatitis: Secondary | ICD-10-CM | POA: Insufficient documentation

## 2023-05-18 DIAGNOSIS — R051 Acute cough: Secondary | ICD-10-CM | POA: Diagnosis not present

## 2023-05-18 DIAGNOSIS — R7309 Other abnormal glucose: Secondary | ICD-10-CM | POA: Diagnosis not present

## 2023-05-18 DIAGNOSIS — R0789 Other chest pain: Secondary | ICD-10-CM | POA: Diagnosis not present

## 2023-05-18 DIAGNOSIS — Z5329 Procedure and treatment not carried out because of patient's decision for other reasons: Secondary | ICD-10-CM | POA: Diagnosis not present

## 2023-05-18 DIAGNOSIS — I10 Essential (primary) hypertension: Secondary | ICD-10-CM | POA: Insufficient documentation

## 2023-05-18 LAB — COMPREHENSIVE METABOLIC PANEL
ALT: 22 U/L (ref 0–44)
AST: 16 U/L (ref 15–41)
Albumin: 4 g/dL (ref 3.5–5.0)
Alkaline Phosphatase: 79 U/L (ref 38–126)
Anion gap: 12 (ref 5–15)
BUN: 12 mg/dL (ref 6–20)
CO2: 24 mmol/L (ref 22–32)
Calcium: 9.2 mg/dL (ref 8.9–10.3)
Chloride: 98 mmol/L (ref 98–111)
Creatinine, Ser: 0.89 mg/dL (ref 0.61–1.24)
GFR, Estimated: >60 mL/min (ref >60)
Glucose, Bld: 388 mg/dL — ABNORMAL HIGH (ref 70–99)
Potassium: 4.3 mmol/L (ref 3.5–5.1)
Sodium: 134 mmol/L — ABNORMAL LOW (ref 135–145)
Total Bilirubin: 0.5 mg/dL (ref 0.3–1.2)
Total Protein: 8.1 g/dL (ref 6.5–8.1)

## 2023-05-18 LAB — RAPID HIV SCREEN (HIV 1/2 AB+AG)
HIV 1/2 Antibodies: NONREACTIVE
HIV-1 P24 Antigen - HIV24: NONREACTIVE

## 2023-05-18 LAB — RAPID URINE DRUG SCREEN, HOSP PERFORMED
Amphetamines: NOT DETECTED
Barbiturates: NOT DETECTED
Benzodiazepines: NOT DETECTED
Cocaine: NOT DETECTED
Opiates: NOT DETECTED
Tetrahydrocannabinol: POSITIVE — AB

## 2023-05-18 LAB — URINALYSIS, ROUTINE W REFLEX MICROSCOPIC
Bilirubin Urine: NEGATIVE
Glucose, UA: >=500 mg/dL — AB
Ketones, ur: NEGATIVE mg/dL
Leukocytes,Ua: NEGATIVE
Nitrite: NEGATIVE
Protein, ur: 100 mg/dL — AB
Specific Gravity, Urine: 1.015 (ref 1.005–1.030)
pH: 5.5 (ref 5.0–8.0)

## 2023-05-18 LAB — URINALYSIS, MICROSCOPIC (REFLEX): Bacteria, UA: NONE SEEN

## 2023-05-18 LAB — CBC WITH DIFFERENTIAL/PLATELET
Abs Immature Granulocytes: 0.04 10*3/uL (ref 0.00–0.07)
Basophils Absolute: 0.1 10*3/uL (ref 0.0–0.1)
Basophils Relative: 1 %
Eosinophils Absolute: 0.4 10*3/uL (ref 0.0–0.5)
Eosinophils Relative: 3 %
HCT: 47.9 % (ref 39.0–52.0)
Hemoglobin: 15.6 g/dL (ref 13.0–17.0)
Immature Granulocytes: 0 %
Lymphocytes Relative: 15 %
Lymphs Abs: 1.8 10*3/uL (ref 0.7–4.0)
MCH: 26 pg (ref 26.0–34.0)
MCHC: 32.6 g/dL (ref 30.0–36.0)
MCV: 79.7 fL — ABNORMAL LOW (ref 80.0–100.0)
Monocytes Absolute: 0.8 10*3/uL (ref 0.1–1.0)
Monocytes Relative: 7 %
Neutro Abs: 9.1 10*3/uL — ABNORMAL HIGH (ref 1.7–7.7)
Neutrophils Relative %: 74 %
Platelets: 264 10*3/uL (ref 150–400)
RBC: 6.01 MIL/uL — ABNORMAL HIGH (ref 4.22–5.81)
RDW: 14.6 % (ref 11.5–15.5)
WBC: 12.2 10*3/uL — ABNORMAL HIGH (ref 4.0–10.5)
nRBC: 0 % (ref 0.0–0.2)

## 2023-05-18 LAB — SARS CORONAVIRUS 2 BY RT PCR: SARS Coronavirus 2 by RT PCR: NEGATIVE

## 2023-05-18 LAB — LIPASE, BLOOD: Lipase: 29 U/L (ref 11–51)

## 2023-05-18 LAB — TROPONIN I (HIGH SENSITIVITY)
Troponin I (High Sensitivity): 18 ng/L — ABNORMAL HIGH (ref <18)
Troponin I (High Sensitivity): 18 ng/L — ABNORMAL HIGH (ref <18)

## 2023-05-18 LAB — BRAIN NATRIURETIC PEPTIDE: B Natriuretic Peptide: 29.3 pg/mL (ref 0.0–100.0)

## 2023-05-18 LAB — LITHIUM LEVEL: Lithium Lvl: <0.06 mmol/L — ABNORMAL LOW (ref 0.60–1.20)

## 2023-05-18 MED ORDER — LIDOCAINE VISCOUS HCL 2 % MT SOLN
15.0000 mL | Freq: Once | OROMUCOSAL | Status: AC
  Administered 2023-05-18: 15 mL via OROMUCOSAL
  Filled 2023-05-18: qty 15

## 2023-05-18 MED ORDER — SODIUM CHLORIDE 0.9 % IV BOLUS
1000.0000 mL | Freq: Once | INTRAVENOUS | Status: AC
  Administered 2023-05-18: 1000 mL via INTRAVENOUS

## 2023-05-18 MED ORDER — NYSTATIN 100000 UNIT/ML MT SUSP: 500000.0000 [IU] | Freq: Four times a day (QID) | OROMUCOSAL | 0 refills | Status: DC

## 2023-05-18 MED ORDER — IOHEXOL 350 MG/ML SOLN
100.0000 mL | Freq: Once | INTRAVENOUS | Status: AC | PRN
  Administered 2023-05-18: 100 mL via INTRAVENOUS

## 2023-05-18 MED ORDER — NYSTATIN 100000 UNIT/ML MT SUSP: 6.0000 mL | Freq: Once | OROMUCOSAL | Status: DC

## 2023-05-18 NOTE — ED Notes (Signed)
Patient transported to CT 

## 2023-05-18 NOTE — ED Notes (Signed)
Pt voiced opinion about about being here too long. Pt is requesting to leave. EDP aware. EDP spoke with pt and states that he is going to leave AMA.

## 2023-05-18 NOTE — ED Triage Notes (Addendum)
Cp x 1 month  pt states feels like his tongue is swollen  and was seen at the dr today and sent here for further tests states his sugar has been in the 300s

## 2023-05-18 NOTE — ED Notes (Signed)
Fall risk armband Fall risk sign on door Patient is wearing sneekers

## 2023-05-18 NOTE — Discharge Instructions (Addendum)
Please follow-up with your primary care doctor for reevaluation of your swallowing difficulties likely secondary to thrush.  Please follow-up with your primary care doctor in regards to lithium dosing.  Your lithium level today was undetectable.  Please follow-up with cardiology.  Recommend marijuana cessation  It was a pleasure caring for you today in the emergency department.  Please return to the emergency department for any worsening or worrisome symptoms.

## 2023-05-18 NOTE — ED Provider Notes (Signed)
[facility] Provider Note  CSN: [id number] Arrival date & time: 05/18/23 8295  Chief Complaint(s) Chest Pain  HPI Lawrence Fisher is a 52 y.o. male with past medical history as below, significant for DM HTN who presents to the ED with complaint of cp/dib, sore throat   Patient reports has been feeling unwell for approximately 2 months, worsening chest pain, exertional dyspnea.  He was started on lithium about a month ago, reports he does not like how makes him feel, makes him sleepy in the daytime does help some with his mood.  He noticed sore throat, difficulty swallowing, questionable tongue swelling over the past 5 days which is gradually worsening.  He has difficulty describing his symptoms. He was seen at PCP office earlier today and sent to the ER for evaluation   Past Medical History Past Medical History:  Diagnosis Date   Diabetes mellitus without complication (HCC)    Hypertension    Patient Active Problem List   Diagnosis Date Noted   Erectile dysfunction due to diabetes mellitus (HCC) 06/02/2021   Hypertension associated with type 2 diabetes mellitus (HCC) 08/01/2016   Uncontrolled type 2 diabetes mellitus with hyperglycemia, without long-term current use of insulin (HCC) 12/29/2015   Home Medication(s) Prior to Admission medications   Medication Sig Start Date End Date Taking? Authorizing Provider  nystatin (MYCOSTATIN) 100000 UNIT/ML suspension Take 5 mLs (500,000 Units total) by mouth 4 (four) times daily. Take until symptoms resolved for 48 hours 05/18/23  Yes Tanda Rockers A, DO  amLODipine (NORVASC) 10 MG tablet Take by mouth. 06/30/20   [provider]  BAYER MICROLET LANCETS lancets Use as instructed to check blood sugar 3 times per day dx code E11.65 12/29/15   Reather Littler, MD  Continuous Blood Gluc Sensor (FREESTYLE LIBRE 2 SENSOR) MISC USE EVERY 14 DAYS 09/08/22   Reather Littler, MD  Continuous Blood Gluc Transmit  (DEXCOM G6 TRANSMITTER) MISC Use one every 90 days Patient not taking: Reported on 09/10/2021 10/30/20   Reather Littler, MD  dapagliflozin propanediol (FARXIGA) 10 MG TABS tablet Take 1 tablet (10 mg total) by mouth daily before breakfast. 08/31/22   Reather Littler, MD  doxycycline (VIBRAMYCIN) 100 MG capsule Take 1 capsule (100 mg total) by mouth 2 (two) times daily. 04/14/23   Pollyann Savoy, MD  glucose blood (BAYER CONTOUR NEXT TEST) test strip Use as instructed to check blood sugar 3 times per day Dx code E11.65 06/02/21   Reather Littler, MD  Insulin Asp Prot & Asp FlexPen (NOVOLOG 70/30 MIX) (70-30) 100 UNIT/ML FlexPen INJECT 35 UNITS BEFORE BREAKFAST AND 45 UNITS BEFORE SUPPER 08/31/22   Reather Littler, MD  Insulin Pen Needle 32G X 4 MM MISC Use to inject insulin 08/31/22   Reather Littler, MD  lisinopril (PRINIVIL,ZESTRIL) 10 MG tablet Take 10 mg by mouth daily. Reported on 03/08/2016    [provider]  lisinopril (ZESTRIL) 40 MG tablet Take 40 mg by mouth daily.    [provider]  Multiple Vitamin (MULTIVITAMIN) capsule Take 1 capsule by mouth daily.    [provider]  OZEMPIC, 0.25 OR 0.5 MG/DOSE, 2 MG/3ML SOPN INJECT 0.5 MG WEEKLY 01/19/23   Reather Littler, MD  Semaglutide, 1 MG/DOSE, (OZEMPIC, 1 MG/DOSE,) 2 MG/1.5ML SOPN Inject 1 mg into the skin once a week. 09/10/21   Reather Littler, MD  sertraline (ZOLOFT) 100 MG tablet Take 100 mg by mouth daily.    [provider]  sildenafil (REVATIO) 20 MG tablet Take 3-4 pills as needed before sexual activity 06/02/21   Reather Littler, MD                                                                                                                                    Past Surgical History Past Surgical History:  Procedure Laterality Date   ANKLE SURGERY     CHOLECYSTECTOMY     OPEN REDUCTION INTERNAL FIXATION (ORIF) METACARPAL Right 06/02/2016   Procedure: OPEN REDUCTION INTERNAL FIXATION (ORIF) right ring phalanx and  METACARPAL  fractures;  Surgeon: Betha Loa, MD;  Location: [facility];  Service: Orthopedics;  Laterality: Right;  OPEN REDUCTION INTERNAL FIXATION (ORIF) right ring phalanx and  METACARPAL fractures   TONSILLECTOMY     Family History History reviewed. No pertinent family history.  Social History Social History   Tobacco Use   Smoking status: Every Day    Current packs/day: 0.50    Types: Cigarettes   Smokeless tobacco: Never  Vaping Use   Vaping status: Never Used  Substance Use Topics   Alcohol use: Yes    Alcohol/week: 0.0 standard drinks of alcohol    Comment: occ   Drug use: Yes    Types: Marijuana    Comment: denies   Allergies Metformin and Metformin and related  Review of Systems Review of Systems  Constitutional:  Negative for chills and fever.  HENT:  Positive for sore throat and trouble swallowing.   Respiratory:  Positive for shortness of breath.   Cardiovascular:  Positive for chest pain.  Gastrointestinal:  Negative for abdominal pain, nausea and vomiting.  Genitourinary:  Negative for dysuria.  Neurological:  Negative for syncope and weakness.  Psychiatric/Behavioral:  Negative for agitation.     Physical Exam Vital Signs  I have reviewed the triage vital signs BP (!) 143/94   Pulse 87   Temp 98.9 F (37.2 C) (Oral)   Resp (!) 22   Ht 6\' 7"  (2.007 m)   Wt 126.1 kg   SpO2 95%   BMI 31.32 kg/m  Physical Exam Vitals and nursing note reviewed.  Constitutional:      General: He is not in acute distress.    Appearance: He is well-developed.  HENT:     Head: Normocephalic and atraumatic.     Right Ear: External ear normal.     Left Ear: External ear normal.     Mouth/Throat:     Mouth: Mucous membranes are moist.     Comments: Apparent thrush to posterior oropharynx Eyes:     General: No scleral icterus. Cardiovascular:     Rate and Rhythm: Normal rate and regular rhythm.     Pulses: Normal pulses.     Heart sounds: Normal heart  sounds.  Pulmonary:     Effort: Pulmonary effort is normal. No respiratory distress.     Breath sounds: Normal breath sounds.  Abdominal:     General: Abdomen is flat.     Palpations: Abdomen is soft.     Tenderness: There is no abdominal tenderness.  Musculoskeletal:     Cervical back: No rigidity.     Right lower leg: No edema.     Left lower leg: No edema.  Skin:    General: Skin is warm and dry.     Capillary Refill: Capillary refill takes less than 2 seconds.  Neurological:     Mental Status: He is alert.  Psychiatric:        Mood and Affect: Mood is anxious.        Speech: Speech is rapid and pressured.        Behavior: Behavior is withdrawn.     ED Results and Treatments Labs (all labs ordered are listed, but only abnormal results are displayed) Labs Reviewed  COMPREHENSIVE METABOLIC PANEL - Abnormal; Notable for the following components:      Result Value   Sodium 134 (*)    Glucose, Bld 388 (*)    All other components within normal limits  CBC WITH DIFFERENTIAL/PLATELET - Abnormal; Notable for the following components:   WBC 12.2 (*)    RBC 6.01 (*)    MCV 79.7 (*)    Neutro Abs 9.1 (*)    All other components within normal limits  LITHIUM LEVEL - Abnormal; Notable for the following components:   Lithium Lvl <0.06 (*)    All other components within normal limits  RAPID URINE DRUG SCREEN, HOSP PERFORMED - Abnormal; Notable for the following components:   Tetrahydrocannabinol POSITIVE (*)    All other components within normal limits  URINALYSIS, ROUTINE W REFLEX MICROSCOPIC - Abnormal; Notable for the following components:   Glucose, UA >=500 (*)    Hgb urine dipstick TRACE (*)    Protein, ur 100 (*)    All other components within normal limits  TROPONIN I (HIGH SENSITIVITY) - Abnormal; Notable for the following components:   Troponin I (High Sensitivity) 18 (*)    All other components within normal limits  TROPONIN I (HIGH SENSITIVITY) - Abnormal; Notable  for the following components:   Troponin I (High Sensitivity) 18 (*)    All other components within normal limits  SARS CORONAVIRUS 2 BY RT PCR  BRAIN NATRIURETIC PEPTIDE  RAPID HIV SCREEN (HIV 1/2 AB+AG)  URINALYSIS, MICROSCOPIC (REFLEX)  LIPASE, BLOOD                                                                                                                          Radiology DG Chest 2 View  Result Date: 05/18/2023 CLINICAL DATA:  One-month history of chest pain EXAM: CHEST - 2 VIEW COMPARISON:  Chest radiograph dated 06/02/2019 FINDINGS: Normal lung volumes. Questionable opacity projecting over the right lower lobe. No pleural effusion or pneumothorax. The heart size and mediastinal contours are within normal limits. No acute osseous abnormality. IMPRESSION: Questionable opacity projecting over the  right lower lobe, which may be artifactual related to superimposition of structures versus consolidation. Recommend correlation with right-sided chest/right upper quadrant symptoms. Electronically Signed   By: Agustin Cree M.D.   On: 05/18/2023 11:31    Pertinent labs & imaging results that were available during my care of the patient were reviewed by me and considered in my medical decision making (see MDM for details).  Medications Ordered in ED Medications  nystatin (MYCOSTATIN) 100000 UNIT/ML suspension 600,000 Units (600,000 Units Mouth/Throat Not Given 05/18/23 1431)  lidocaine (XYLOCAINE) 2 % viscous mouth solution 15 mL (15 mLs Mouth/Throat Given 05/18/23 0953)  sodium chloride 0.9 % bolus 1,000 mL (0 mLs Intravenous Stopped 05/18/23 1106)  iohexol (OMNIPAQUE) 350 MG/ML injection 100 mL (100 mLs Intravenous Contrast Given 05/18/23 1217)                                                                                                                                     Procedures .Critical Care  Performed by: Sloan Leiter, DO Authorized by: Sloan Leiter, DO   Critical care provider  statement:    Critical care time (minutes):  30   Critical care time was exclusive of:  Separately billable procedures and treating other patients   Critical care was necessary to treat or prevent imminent or life-threatening deterioration of the following conditions:  Cardiac failure   Critical care was time spent personally by me on the following activities:  Development of treatment plan with patient or surrogate, discussions with consultants, evaluation of patient's response to treatment, examination of patient, ordering and review of laboratory studies, ordering and review of radiographic studies, ordering and performing treatments and interventions, pulse oximetry, re-evaluation of patient's condition, review of old charts and obtaining history from patient or surrogate Comments:     +trop   (including critical care time)  Medical Decision Making / ED Course    Medical Decision Making:    NIKE NEWHART is a 52 y.o. male with past medical history as below, significant for DM HTN who presents to the ED with complaint of cp/dib, sore throat . The complaint involves an extensive differential diagnosis and also carries with it a high risk of complications and morbidity.  Serious etiology was considered. Ddx includes but is not limited to: Differential includes all life-threatening causes for chest pain. This includes but is not exclusive to acute coronary syndrome, aortic dissection, pulmonary embolism, cardiac tamponade, community-acquired pneumonia, pericarditis, musculoskeletal chest wall pain, etc. In my evaluation of this patient's dyspnea my DDx includes, but is not limited to, pneumonia, pulmonary embolism, pneumothorax, pulmonary edema, metabolic acidosis, asthma, COPD, cardiac cause, anemia, anxiety, etc.    Complete initial physical exam performed, notably the patient  was tachycardia noted, no hypoxia, no drooling stridor or trismus.    Reviewed and confirmed nursing documentation  for past medical history, family history, social history.  Vital signs reviewed.  Clinical Course as of 05/18/23 1437  Thu May 18, 2023  1326 Trop 18, delta was flat at 18 [SG]  1326 Lithium(!): <0.06 Started on lithium around 1 month ago, reports compliance  [SG]  1408 Concern for oropharyngeal candidiasis, give nystatin [SG]    Clinical Course User Index [SG] Sloan Leiter, DO     Patient with apparent oropharyngeal thrush on exam, start nystatin.  Unclear etiology of this, no recent antibiotics.  HIV was negative.  No recent use of steroids or inhaled steroids.  He is diabetic, likely factor. Defer further management to PCP  Trop elev but flat, HEART score 4, denies having cardiologist; not currently having chest pain or dyspnea.  Lithium level undetectable, reports compliance.  THC positive, recommend cessation  Patient is pending CTA of his chest, he is requesting immediate discharge because he is hungry.  He was offered food and drink here but reports that he has been here long enough and is ready to leave.  Will sign outpatient AMA.  Will give referral to cardiology, nystatin for home.  Advise he follow-up closely with his PCP for ongoing care.  The patient has requested to leave the ED against medical advice. I believe this patient is of sound mind and medical decision making capacity to refuse medical care. The patient is responding and asking questions appropriately. The patient is oriented to person, place and time. The patient is not psychotic, delusional, suicidal, homicidal or hallucinating. The patient demonstrates a normal mental capacity to make decisions regarding their healthcare. The patient is clinically sober and does not appear to be under the influence of any illicit drugs at this time. The patient has been advised of the risks, in layman terms, of leaving AMA which include, but are not limited to death, coma, permanent disability, loss of current lifestyle, delay  in diagnosis. Alternatives have been offered - the patient remains steadfast in their wish to leave. The patient has been advised that should they change their mind they are welcome to return to this hospital, or any other, at any time. The patient understands that in no way does an AMA discharge mean that I do not want them to have the best medical care available. To this end, I have offered appropriate prescriptions, referrals, and discharge instructions. The patient did sign AMA paperwork. The above discussion was witnessed by another member of staff.,a           Additional history obtained: -Additional history obtained from spouse -External records from outside source obtained and reviewed including: Chart review including previous notes, labs, imaging, consultation notes including  Home medications, prior labs and imaging   Lab Tests: -I ordered, reviewed, and interpreted labs.   The pertinent results include:   Labs Reviewed  COMPREHENSIVE METABOLIC PANEL - Abnormal; Notable for the following components:      Result Value   Sodium 134 (*)    Glucose, Bld 388 (*)    All other components within normal limits  CBC WITH DIFFERENTIAL/PLATELET - Abnormal; Notable for the following components:   WBC 12.2 (*)    RBC 6.01 (*)    MCV 79.7 (*)    Neutro Abs 9.1 (*)    All other components within normal limits  LITHIUM LEVEL - Abnormal; Notable for the following components:   Lithium Lvl <0.06 (*)    All other components within normal limits  RAPID URINE DRUG SCREEN, HOSP PERFORMED - Abnormal; Notable for the following components:   Tetrahydrocannabinol POSITIVE (*)  All other components within normal limits  URINALYSIS, ROUTINE W REFLEX MICROSCOPIC - Abnormal; Notable for the following components:   Glucose, UA >=500 (*)    Hgb urine dipstick TRACE (*)    Protein, ur 100 (*)    All other components within normal limits  TROPONIN I (HIGH SENSITIVITY) - Abnormal; Notable for the  following components:   Troponin I (High Sensitivity) 18 (*)    All other components within normal limits  TROPONIN I (HIGH SENSITIVITY) - Abnormal; Notable for the following components:   Troponin I (High Sensitivity) 18 (*)    All other components within normal limits  SARS CORONAVIRUS 2 BY RT PCR  BRAIN NATRIURETIC PEPTIDE  RAPID HIV SCREEN (HIV 1/2 AB+AG)  URINALYSIS, MICROSCOPIC (REFLEX)  LIPASE, BLOOD    Notable for as above, troponin 18  EKG   EKG Interpretation Date/Time:  Thursday May 18 2023 09:27:01 EDT Ventricular Rate:  103 PR Interval:  143 QRS Duration:  85 QT Interval:  335 QTC Calculation: 439 R Axis:   78  Text Interpretation: Sinus tachycardia Probable left atrial enlargement Borderline repolarization abnormality Confirmed by Tanda Rockers ([id number]) on 05/18/2023 2:08:29 PM         Imaging Studies ordered: I ordered imaging studies including chest x-ray, CT PE I independently visualized the following imaging with scope of interpretation limited to determining acute life threatening conditions related to emergency care; findings noted above, significant for right lower lobe opacity, questionable, CT imaging obtained to further evaluate, CT pending I independently visualized and interpreted imaging. I agree with the radiologist interpretation   Medicines ordered and prescription drug management: Meds ordered this encounter  Medications   lidocaine (XYLOCAINE) 2 % viscous mouth solution 15 mL   sodium chloride 0.9 % bolus 1,000 mL   iohexol (OMNIPAQUE) 350 MG/ML injection 100 mL   nystatin (MYCOSTATIN) 100000 UNIT/ML suspension 600,000 Units   nystatin (MYCOSTATIN) 100000 UNIT/ML suspension    Sig: Take 5 mLs (500,000 Units total) by mouth 4 (four) times daily. Take until symptoms resolved for 48 hours    Dispense:  60 mL    Refill:  0    -I have reviewed the patients home medicines and have made adjustments as needed   Consultations  Obtained: na   Cardiac Monitoring: The patient was maintained on a cardiac monitor.  I personally viewed and interpreted the cardiac monitored which showed an underlying rhythm of: NSR  Social Determinants of Health:  Diagnosis or treatment significantly limited by social determinants of health: current smoker thc use   Reevaluation: After the interventions noted above, I reevaluated the patient and found that they have stayed the same  Co morbidities that complicate the patient evaluation  Past Medical History:  Diagnosis Date   Diabetes mellitus without complication (HCC)    Hypertension       Dispostion: Disposition decision including need for hospitalization was considered, and patient Left against medical advice    Final Clinical Impression(s) / ED Diagnoses Final diagnoses:  Thrush  Chest pain, unspecified type  Marijuana use  Left against medical advice        Sloan Leiter, DO 05/18/23 1437

## 2023-05-18 NOTE — ED Notes (Signed)
Patient transported to X-ray 

## 2023-05-18 NOTE — ED Notes (Signed)
ED Provider at bedside. 

## 2023-05-19 DIAGNOSIS — F4310 Post-traumatic stress disorder, unspecified: Secondary | ICD-10-CM | POA: Diagnosis not present

## 2023-05-19 DIAGNOSIS — F1210 Cannabis abuse, uncomplicated: Secondary | ICD-10-CM | POA: Diagnosis not present

## 2023-05-19 DIAGNOSIS — F1010 Alcohol abuse, uncomplicated: Secondary | ICD-10-CM | POA: Diagnosis not present

## 2023-05-19 DIAGNOSIS — F339 Major depressive disorder, recurrent, unspecified: Secondary | ICD-10-CM | POA: Diagnosis not present

## 2023-05-31 DIAGNOSIS — R079 Chest pain, unspecified: Secondary | ICD-10-CM | POA: Diagnosis not present

## 2023-05-31 DIAGNOSIS — E1159 Type 2 diabetes mellitus with other circulatory complications: Secondary | ICD-10-CM | POA: Diagnosis not present

## 2023-05-31 DIAGNOSIS — F331 Major depressive disorder, recurrent, moderate: Secondary | ICD-10-CM | POA: Diagnosis not present

## 2023-05-31 DIAGNOSIS — E1165 Type 2 diabetes mellitus with hyperglycemia: Secondary | ICD-10-CM | POA: Diagnosis not present

## 2023-05-31 DIAGNOSIS — I152 Hypertension secondary to endocrine disorders: Secondary | ICD-10-CM | POA: Diagnosis not present

## 2023-05-31 DIAGNOSIS — B370 Candidal stomatitis: Secondary | ICD-10-CM | POA: Diagnosis not present

## 2023-06-19 DIAGNOSIS — L84 Corns and callosities: Secondary | ICD-10-CM | POA: Diagnosis not present

## 2023-06-19 DIAGNOSIS — Z8631 Personal history of diabetic foot ulcer: Secondary | ICD-10-CM | POA: Diagnosis not present

## 2023-06-19 DIAGNOSIS — E1165 Type 2 diabetes mellitus with hyperglycemia: Secondary | ICD-10-CM | POA: Diagnosis not present

## 2023-06-19 DIAGNOSIS — Z794 Long term (current) use of insulin: Secondary | ICD-10-CM | POA: Diagnosis not present

## 2023-06-22 ENCOUNTER — Ambulatory Visit: Payer: BC Managed Care – PPO

## 2023-08-01 ENCOUNTER — Other Ambulatory Visit: Payer: Self-pay

## 2023-08-01 DIAGNOSIS — E119 Type 2 diabetes mellitus without complications: Secondary | ICD-10-CM | POA: Insufficient documentation

## 2023-08-01 DIAGNOSIS — I10 Essential (primary) hypertension: Secondary | ICD-10-CM | POA: Insufficient documentation

## 2023-08-02 ENCOUNTER — Ambulatory Visit: Payer: BC Managed Care – PPO | Attending: Cardiology | Admitting: Cardiology

## 2023-08-02 ENCOUNTER — Encounter: Payer: Self-pay | Admitting: Cardiology

## 2023-08-02 VITALS — BP 160/90 | HR 111 | Ht 78.6 in | Wt 276.0 lb

## 2023-08-02 DIAGNOSIS — I259 Chronic ischemic heart disease, unspecified: Secondary | ICD-10-CM

## 2023-08-02 DIAGNOSIS — I209 Angina pectoris, unspecified: Secondary | ICD-10-CM | POA: Insufficient documentation

## 2023-08-02 DIAGNOSIS — E782 Mixed hyperlipidemia: Secondary | ICD-10-CM | POA: Diagnosis not present

## 2023-08-02 DIAGNOSIS — E1159 Type 2 diabetes mellitus with other circulatory complications: Secondary | ICD-10-CM | POA: Diagnosis not present

## 2023-08-02 DIAGNOSIS — I10 Essential (primary) hypertension: Secondary | ICD-10-CM

## 2023-08-02 DIAGNOSIS — I152 Hypertension secondary to endocrine disorders: Secondary | ICD-10-CM

## 2023-08-02 DIAGNOSIS — F17200 Nicotine dependence, unspecified, uncomplicated: Secondary | ICD-10-CM

## 2023-08-02 HISTORY — DX: Angina pectoris, unspecified: I20.9

## 2023-08-02 MED ORDER — METOPROLOL TARTRATE 100 MG PO TABS
100.0000 mg | ORAL_TABLET | Freq: Once | ORAL | 0 refills | Status: DC
Start: 2023-08-02 — End: 2023-09-05

## 2023-08-02 MED ORDER — NITROGLYCERIN 0.4 MG SL SUBL: 0.4000 mg | SUBLINGUAL_TABLET | SUBLINGUAL | 6 refills | Status: DC | PRN

## 2023-08-02 MED ORDER — ASPIRIN 81 MG PO TBEC: 81.0000 mg | DELAYED_RELEASE_TABLET | Freq: Every day | ORAL | 3 refills | Status: AC

## 2023-08-02 NOTE — Patient Instructions (Signed)
Please keep a BP log for 2 weeks and send by MyChart or mail.                         Name and DOB__________________________ Dr. Tomie China [address] [place], [place] [address]  Blood Pressure Record Sheet To take your blood pressure, you will need a blood pressure machine. You can buy a blood pressure machine (blood pressure monitor) at your clinic, drug store, or online. When choosing one, consider: An automatic monitor that has an arm cuff. A cuff that wraps snugly around your upper arm. You should be able to fit only one finger between your arm and the cuff. A device that stores blood pressure reading results. Do not choose a monitor that measures your blood pressure from your wrist or finger. Follow your health care provider's instructions for how to take your blood pressure. To use this form: Get one reading in the morning (a.m.) 1-2 hours after you take any medicines. Get one reading in the evening (p.m.) before supper.   Blood pressure log Date: _______________________  a.m. _____________________(1st reading) HR___________            p.m. _____________________(2nd reading) HR__________  Date: _______________________  a.m. _____________________(1st reading) HR___________            p.m. _____________________(2nd reading) HR__________  Date: _______________________  a.m. _____________________(1st reading) HR___________            p.m. _____________________(2nd reading) HR__________  Date: _______________________  a.m. _____________________(1st reading) HR___________            p.m. _____________________(2nd reading) HR__________  Date: _______________________  a.m. _____________________(1st reading) HR___________            p.m. _____________________(2nd reading) HR__________  Date: _______________________  a.m. _____________________(1st reading) HR___________            p.m. _____________________(2nd reading) HR__________  Date: _______________________  a.m.  _____________________(1st reading) HR___________            p.m. _____________________(2nd reading) HR__________   This information is not intended to replace advice given to you by your health care provider. Make sure you discuss any questions you have with your health care provider. Document Revised: 11/27/2019 Document Reviewed: 11/27/2019 Elsevier Patient Education  2021 [employer]   Medication Instructions:  Your physician has recommended you make the following change in your medication:   Start taking 81 mg coated aspirin daily.  Use nitroglycerin 1 tablet placed under the tongue at the first sign of chest pain or an angina attack. 1 tablet may be used every 5 minutes as needed, for up to 15 minutes. Do not take more than 3 tablets in 15 minutes. If pain persist call 911 or go to the nearest ED.   *If you need a refill on your cardiac medications before your next appointment, please call your pharmacy*   Lab Work: Your physician recommends that you have a CMP and lipids today in the office.    If you have labs (blood work) drawn today and your tests are completely normal, you will receive your results only by: MyChart Message (if you have MyChart) OR A paper copy in the mail If you have any lab test that is abnormal or we need to change your treatment, we will call you to review the results.    Testing/Procedures:   Your cardiac CT will be scheduled at the following location:   [facility] [address], [address] [place],  [place]  [address]  Main: [phone]  Hold all erectile dysfunction medications at least 3 days (72 hrs) prior to test. (Ie viagra, cialis, sildenafil, tadalafil, etc) We will administer nitroglycerin during this exam.   On the Night Before the Test: Be sure to Drink plenty of water. Do not consume any caffeinated/decaffeinated beverages or chocolate 12 hours prior to your test. Do not take  any antihistamines 12 hours prior to your test.  On the Day of the Test: Drink plenty of water until 1 hour prior to the test. Do not eat any food 1 hour prior to test. You may take your regular medications prior to the test.  Take metoprolol (Lopressor) 100 mg (1 tablet) two hours prior to test. This is a one time dose. RX has been sent to your pharmacy.  After the Test: Drink plenty of water. After receiving IV contrast, you may experience a mild flushed feeling. This is normal. On occasion, you may experience a mild rash up to 24 hours after the test. This is not dangerous. If this occurs, you can take Benadryl 25 mg and increase your fluid intake. If you experience trouble breathing, this can be serious. If it is severe call 911 IMMEDIATELY. If it is mild, please call our office. If you take any of these medications: Glipizide/Metformin, Avandament, Glucavance, please do not take 48 hours after completing test unless otherwise instructed.  We will call to schedule your test 2-4 weeks out understanding that some insurance companies will need an authorization prior to the service being performed.   For non-scheduling related questions, please contact the cardiac imaging nurse navigator should you have any questions/concerns: Rockwell Alexandria, Cardiac Imaging Nurse Navigator Larey Brick, Cardiac Imaging Nurse Navigator [facility] Direct Office Dial: [phone]   For scheduling needs, including cancellations and rescheduling, please call [place], [phone].   Your physician has requested that you have an echocardiogram. Echocardiography is a painless test that uses sound waves to create images of your heart. It provides your doctor with information about the size and shape of your heart and how well your heart's chambers and valves are working. This procedure takes approximately one hour. There are no restrictions for this procedure. Please do NOT wear  cologne, perfume, aftershave, or lotions (deodorant is allowed). Please arrive 15 minutes prior to your appointment time.   Your next appointment:   2 month(s)  The format for your next appointment:   In Person  Provider:   Belva Crome, MD   Other Instructions Cardiac CT Angiogram A cardiac CT angiogram is a procedure to look at the heart and the area around the heart. It may be done to help find the cause of chest pains or other symptoms of heart disease. During this procedure, a substance called contrast dye is injected into the blood vessels in the area to be checked. A large X-ray machine, called a CT scanner, then takes detailed pictures of the heart and the surrounding area. The procedure is also sometimes called a coronary CT angiogram, coronary artery scanning, or CTA. A cardiac CT angiogram allows the health care provider to see how well blood is flowing to and from the heart. The health care provider will be able to see if there are any problems, such as: Blockage or narrowing of the coronary arteries in the heart. Fluid around the heart. Signs of weakness or disease in the muscles, valves, and tissues of  the heart. Tell a health care provider about: Any allergies you have. This is especially important if you have had a previous allergic reaction to contrast dye. All medicines you are taking, including vitamins, herbs, eye drops, creams, and over-the-counter medicines. Any blood disorders you have. Any surgeries you have had. Any medical conditions you have. Whether you are pregnant or may be pregnant. Any anxiety disorders, chronic pain, or other conditions you have that may increase your stress or prevent you from lying still. What are the risks? Generally, this is a safe procedure. However, problems may occur, including: Bleeding. Infection. Allergic reactions to medicines or dyes. Damage to other structures or organs. Kidney damage from the contrast dye that is  used. Increased risk of cancer from radiation exposure. This risk is low. Talk with your health care provider about: The risks and benefits of testing. How you can receive the lowest dose of radiation. What happens before the procedure? Wear comfortable clothing and remove any jewelry, glasses, dentures, and hearing aids. Follow instructions from your health care provider about eating and drinking. This may include: For 12 hours before the procedure -- avoid caffeine. This includes tea, coffee, soda, energy drinks, and diet pills. Drink plenty of water or other fluids that do not have caffeine in them. Being well hydrated can prevent complications. For 4-6 hours before the procedure -- stop eating and drinking. The contrast dye can cause nausea, but this is less likely if your stomach is empty. Ask your health care provider about changing or stopping your regular medicines. This is especially important if you are taking diabetes medicines, blood thinners, or medicines to treat problems with erections (erectile dysfunction). What happens during the procedure?  Hair on your chest may need to be removed so that small sticky patches called electrodes can be placed on your chest. These will transmit information that helps to monitor your heart during the procedure. An IV will be inserted into one of your veins. You might be given a medicine to control your heart rate during the procedure. This will help to ensure that good images are obtained. You will be asked to lie on an exam table. This table will slide in and out of the CT machine during the procedure. Contrast dye will be injected into the IV. You might feel warm, or you may get a metallic taste in your mouth. You will be given a medicine called nitroglycerin. This will relax or dilate the arteries in your heart. The table that you are lying on will move into the CT machine tunnel for the scan. The person running the machine will give you  instructions while the scans are being done. You may be asked to: Keep your arms above your head. Hold your breath. Stay very still, even if the table is moving. When the scanning is complete, you will be moved out of the machine. The IV will be removed. The procedure may vary among health care providers and hospitals. What can I expect after the procedure? After your procedure, it is common to have: A metallic taste in your mouth from the contrast dye. A feeling of warmth. A headache from the nitroglycerin. Follow these instructions at home: Take over-the-counter and prescription medicines only as told by your health care provider. If you are told, drink enough fluid to keep your urine pale yellow. This will help to flush the contrast dye out of your body. Most people can return to their normal activities right after the procedure. Ask  your health care provider what activities are safe for you. It is up to you to get the results of your procedure. Ask your health care provider, or the department that is doing the procedure, when your results will be ready. Keep all follow-up visits as told by your health care provider. This is important. Contact a health care provider if: You have any symptoms of allergy to the contrast dye. These include: Shortness of breath. Rash or hives. A racing heartbeat. Summary A cardiac CT angiogram is a procedure to look at the heart and the area around the heart. It may be done to help find the cause of chest pains or other symptoms of heart disease. During this procedure, a large X-ray machine, called a CT scanner, takes detailed pictures of the heart and the surrounding area after a contrast dye has been injected into blood vessels in the area. Ask your health care provider about changing or stopping your regular medicines before the procedure. This is especially important if you are taking diabetes medicines, blood thinners, or medicines to treat erectile  dysfunction. If you are told, drink enough fluid to keep your urine pale yellow. This will help to flush the contrast dye out of your body. This information is not intended to replace advice given to you by your health care provider. Make sure you discuss any questions you have with your health care provider. Document Revised: 04/03/2019 Document Reviewed: 04/03/2019 Elsevier Patient Education  [employer].  Echocardiogram An echocardiogram is a test that uses sound waves to make images of your heart. This way of making images is often called ultrasound. The images from this test can help find out many things about your heart, including: The size and shape of your heart. The strength of your heart muscle and how well it's working. The size, thickness, and movement of your heart's walls. How your heart valves are working. Problems such as: A tumor or a growth from an infection around the heart valves. Areas of heart muscle that aren't working well because of poor blood flow or injury from a heart attack. An aneurysm. This is a weak or damaged part of an artery wall. An artery is a blood vessel. Tell a health care provider about: Any allergies you have. All medicines you're taking, including vitamins, herbs, eye drops, creams, and over-the-counter medicines. Any bleeding problems you have. Any surgeries you've had. Any medical problems you have. Whether you're pregnant or may be pregnant. What are the risks? Your health care provider will talk with you about risks. These may include an allergic reaction to IV dye that may be used during the test. What happens before the test? You don't need to do anything to get ready for this test. You may eat and drink normally. What happens during the test?  You'll take off your clothes from the waist up and put on a hospital gown. Sticky patches called electrodes may be placed on your chest. These will be connected to a machine that monitors  your heart rate and rhythm. You'll lie down on a table for the exam. A wand covered in gel will be moved over your chest. Sound waves from the wand will go to your heart and bounce back--or "echo" back. The sound waves will go to a computer that uses them to make images of your heart. The images can be viewed on a monitor. The images will also be recorded on the computer so your provider can look at them  later. You may be asked to change positions or hold your breath for a short time. This makes it easier to get different views or better views of your heart. In some cases, you may be given a dye through an IV. The IV is put into one of your veins. This dye can make the areas of your heart easier to see. The procedure may vary among providers and hospitals. What can I expect after the test? You may return to your normal diet, activities, and medicines unless your provider tells you not to. If an IV was placed for the test, it will be removed. It's up to you to get the results of your test. Ask your provider, or the department that's doing the test, when your results will be ready. This information is not intended to replace advice given to you by your health care provider. Make sure you discuss any questions you have with your health care provider. Document Revised: 10/07/2022 Document Reviewed: 10/07/2022 Elsevier Patient Education  2024 [employer].

## 2023-08-02 NOTE — Progress Notes (Signed)
Cardiology Office Note:    Date:  08/02/2023   ID:  [facility], DOB 1970/10/12, MRN [id number]  PCP:  Lawrence Nay, PA  Cardiologist:  Lawrence Brothers, MD   Referring MD: Lawrence Leiter, DO    ASSESSMENT:    1. Mixed hyperlipidemia   2. Hypertension associated with type 2 diabetes mellitus (HCC)   3. Primary hypertension   4. Angina pectoris (HCC)   5. Smoker    PLAN:    In order of problems listed above:  Angina pectoris: Patient's symptoms are concerning.  Patient has multiple risk factors for coronary artery disease and they are largely uncontrolled.  In view of this I discussed invasive and noninvasive evaluation.  Patient prefers CT coronary angiography and we will schedule him for the same.  Further recommendations will be made based on the findings of the coronary angiography.  He was advised to take a coated baby aspirin on a daily basis.  Sublingual nitroglycerin prescription was sent, its protocol and 911 protocol explained and the patient vocalized understanding questions were answered to the patient's satisfaction Essential hypertension: Blood pressure is elevated.  Appears to have a component of whitecoat hypertension.  I told the wife that they need to keep a track send blood pressure readings at home and they will send it to me in a week and I will titrate medications accordingly.  Sometimes he has issues that are suggestive of autonomic diabetic neuropathy and therefore I am not too keen on aggressive blood pressure lowering without understanding his home blood pressures. Uncontrolled diabetes mellitus: Followed by endocrinologist.  I discussed with him this agreement serious consequences of such elevated hemoglobin A1c and he vocalized understanding.  I discussed vascular events. Cigarette smoker: I spent 5 minutes with the patient discussing solely about smoking. Smoking cessation was counseled. I suggested to the patient also different medications and  pharmacological interventions. Patient is keen to try stopping on its own at this time. He will get back to me if he needs any further assistance in this matter. Cardiac murmur: Echocardiogram will be done to assess murmur heard on auscultation. Patient will be seen in follow-up appointment in 6 months or earlier if the patient has any concerns.    Medication Adjustments/Labs and Tests Ordered: Current medicines are reviewed at length with the patient today.  Concerns regarding medicines are outlined above.  Orders Placed This Encounter  Procedures   EKG 12-Lead   No orders of the defined types were placed in this encounter.    History of Present Illness:    Law[facility] Fisher is a 52 y.o. male who is being seen today for the evaluation of chest pain at the request of Lawrence Leiter, DO.  Patient is a pleasant 52 year old male.  He has past medical history of essential hypertension, uncontrolled diabetes mellitus, mixed dyslipidemia and unfortunately continues to smoke.  Patient mentions to me that he has been having chest tightness at times and he sweats when this happens.  Unclear whether this radiates to the neck.  He feels very heavy in the head and dizzy when it happens.  At the time of my evaluation, the patient is alert awake oriented and in no distress.  He has never passed out.  Past Medical History:  Diagnosis Date   Acquired bilateral hammer toes 11/18/2022   Chronic diarrhea 08/01/2016   Chronic fatigue 10/11/2021   Class 1 obesity with serious comorbidity and body mass index (BMI) of 33.0 to  33.9 in adult 10/18/2018   Closed displaced fracture of neck of fourth metacarpal bone of right hand with routine healing 06/22/2016   Closed displaced fracture of proximal phalanx of right ring finger with routine healing 06/22/2016   Diabetes mellitus without complication (HCC)    Diabetic neuropathy with neurologic complication (HCC) 10/18/2018   Erectile dysfunction due to diabetes  mellitus (HCC) 06/02/2021   Generalized abdominal pain 08/01/2016   Heel cord tightness, right 03/27/2023   History of diabetic ulcer of foot 01/09/2023   Hypertension    Hypertension associated with type 2 diabetes mellitus (HCC) 08/01/2016   Incontinence of feces 08/01/2016   Microalbuminuria due to type 2 diabetes mellitus (HCC) 07/10/2018   Mixed hyperlipidemia 07/10/2018   Moderate episode of recurrent major depressive disorder (HCC) 08/01/2016   Neuropathy due to type 2 diabetes mellitus (HCC) 11/18/2022   Plantar callus 12/19/2022   Right shoulder pain 09/25/2018   Added automatically from request for surgery [id number]     Smoker 08/01/2016   Ulcer of right foot, limited to breakdown of skin (HCC) 11/18/2022   Uncontrolled type 2 diabetes mellitus with hyperglycemia, without long-term current use of insulin (HCC) 12/29/2015   Unspecified dislocation of right shoulder joint, initial encounter 05/21/2018    MRI right shoulder-05/29/2018- changes consistent with history of anterior dislocation with subacute Hill-Sachs impaction fracture of the proximal humerus, displaced glenoid fracture and Bankart lesion with associated intra-articular loose bodies and extensive labral tearing, rotator cuff tendinopathy without tear, 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     Vitamin D deficiency 10/20/2021    Past Surgical History:  Procedure Laterality Date   ANKLE SURGERY     CHOLECYSTECTOMY     OPEN REDUCTION INTERNAL FIXATION (ORIF) METACARPAL Right 06/02/2016   Procedure: OPEN REDUCTION INTERNAL FIXATION (ORIF) right ring phalanx and  METACARPAL fractures;  Surgeon: Lawrence Loa, MD;  Location: [facility];  Service: Orthopedics;  Laterality: Right;  OPEN REDUCTION INTERNAL FIXATION (ORIF) right ring phalanx and  METACARPAL fractures   TONSILLECTOMY      Current Medications: Current Meds  Medication Sig   amLODipine (NORVASC) 10 MG tablet Take 10 mg by mouth daily.   BAYER  MICROLET LANCETS lancets Use as instructed to check blood sugar 3 times per day dx code E11.65   Continuous Blood Gluc Sensor (FREESTYLE LIBRE 2 SENSOR) MISC USE EVERY 14 DAYS   Continuous Blood Gluc Transmit (DEXCOM G6 TRANSMITTER) MISC Use one every 90 days   dapagliflozin propanediol (FARXIGA) 10 MG TABS tablet Take 1 tablet (10 mg total) by mouth daily before breakfast.   glucose blood (BAYER CONTOUR NEXT TEST) test strip Use as instructed to check blood sugar 3 times per day Dx code E11.65   Insulin Asp Prot & Asp FlexPen (NOVOLOG 70/30 MIX) (70-30) 100 UNIT/ML FlexPen INJECT 35 UNITS BEFORE BREAKFAST AND 45 UNITS BEFORE SUPPER   Insulin Pen Needle 32G X 4 MM MISC Use to inject insulin   lisinopril (ZESTRIL) 10 MG tablet Take 10 mg by mouth daily.   sertraline (ZOLOFT) 100 MG tablet Take 100 mg by mouth daily.     Allergies:   Metformin and Metformin and related   Social History   Socioeconomic History   Marital status: Married    Spouse name: Not on file   Number of children: Not on file   Years of education: Not on file   Highest education level: Not on file  Occupational History   Not on file  Tobacco Use   Smoking status: Every Day    Current packs/day: 0.50    Types: Cigarettes   Smokeless tobacco: Never  Vaping Use   Vaping status: Never Used  Substance and Sexual Activity   Alcohol use: Yes    Alcohol/week: 0.0 standard drinks of alcohol    Comment: occ   Drug use: Yes    Types: Marijuana    Comment: denies   Sexual activity: Not on file  Other Topics Concern   Not on file  Social History Narrative   ** Merged History Encounter **       Social Determinants of Health   Financial Resource Strain: Low Risk  (11/11/2022)   Received from [facility]   Overall Financial Resource Strain (CARDIA)    Difficulty of Paying Living Expenses: Not hard at all  Food Insecurity: No Food Insecurity (11/11/2022)   Received from [facility], [facility]    Hunger Vital Sign    Worried About Running Out of Food in the Last Year: Never true    Ran Out of Food in the Last Year: Never true  Transportation Needs: No Transportation Needs (11/11/2022)   Received from [facility], [facility]   PRAPARE - Transportation    Lack of Transportation (Medical): No    Lack of Transportation (Non-Medical): No  Physical Activity: Not on file  Stress: Not on file  Social Connections: Unknown (01/03/2022)   Received from [facility], [facility]   Social Network    Social Network: Not on file     Family History: The patient's family history is not on file.  ROS:   Please see the history of present illness.    All other systems reviewed and are negative.  EKGs/Labs/Other Studies Reviewed:    The following studies were reviewed today:  EKG Interpretation Date/Time:  Wednesday August 02 2023 09:01:41 EST Ventricular Rate:  111 PR Interval:  138 QRS Duration:  82 QT Interval:  336 QTC Calculation: 456 R Axis:   74  Text Interpretation: Sinus tachycardia T wave abnormality, consider inferior ischemia When compared with ECG of 18-May-2023 09:27, PREVIOUS ECG IS PRESENT Confirmed by Belva Crome [phone]) on 08/02/2023 9:08:41 AM     Recent Labs: 05/18/2023: ALT 22; B Natriuretic Peptide 29.3; BUN 12; Creatinine, Ser 0.89; Hemoglobin 15.6; Platelets 264; Potassium 4.3; Sodium 134  Recent Lipid Panel    Component Value Date/Time   CHOL 152 11/17/2021 0832   TRIG 160.0 (H) 11/17/2021 0832   HDL 43.00 11/17/2021 0832   CHOLHDL 4 11/17/2021 0832   VLDL 32.0 11/17/2021 0832   LDLCALC 77 11/17/2021 0832    Physical Exam:    VS:  BP (!) 160/90   Pulse (!) 111   Ht 6' 6.6" (1.996 m)   Wt 276 lb (125.2 kg)   SpO2 96%   BMI 31.41 kg/m     Wt Readings from Last 3 Encounters:  08/02/23 276 lb (125.2 kg)  05/18/23 278 lb (126.1 kg)  04/13/23 278 lb (126.1 kg)     GEN: Patient is in no acute distress HEENT: Normal NECK: No JVD;  No carotid bruits LYMPHATICS: No lymphadenopathy CARDIAC: S1 S2 regular, 2/6 systolic murmur at the apex. RESPIRATORY:  Clear to auscultation without rales, wheezing or rhonchi  ABDOMEN: Soft, non-tender, non-distended MUSCULOSKELETAL:  No edema; No deformity  SKIN: Warm and dry NEUROLOGIC:  Alert and oriented x 3 PSYCHIATRIC:  Normal affect    Signed, Lawrence Brothers, MD  08/02/2023 9:20 AM    [facility]

## 2023-08-04 ENCOUNTER — Other Ambulatory Visit: Payer: Self-pay

## 2023-08-04 DIAGNOSIS — E1165 Type 2 diabetes mellitus with hyperglycemia: Secondary | ICD-10-CM

## 2023-08-04 MED ORDER — DAPAGLIFLOZIN PROPANEDIOL 10 MG PO TABS: 10.0000 mg | ORAL_TABLET | Freq: Every day | ORAL | 3 refills | Status: DC

## 2023-08-28 ENCOUNTER — Ambulatory Visit (HOSPITAL_BASED_OUTPATIENT_CLINIC_OR_DEPARTMENT_OTHER): Payer: BC Managed Care – PPO

## 2023-08-28 ENCOUNTER — Other Ambulatory Visit (HOSPITAL_BASED_OUTPATIENT_CLINIC_OR_DEPARTMENT_OTHER): Payer: BC Managed Care – PPO

## 2023-08-31 ENCOUNTER — Encounter (HOSPITAL_COMMUNITY): Payer: Self-pay

## 2023-09-04 ENCOUNTER — Telehealth (HOSPITAL_COMMUNITY): Payer: Self-pay | Admitting: *Deleted

## 2023-09-04 NOTE — Telephone Encounter (Signed)
 Reaching out to patient to offer assistance regarding upcoming cardiac imaging study; spoke to wife who verbalizes understanding of appt date/time, parking situation and where to check in, pre-test NPO status and medications ordered, and verified current allergies; name and call back number provided for further questions should they arise Johney Frame RN Navigator Cardiac Imaging [facility] [phone] office [phone] cell

## 2023-09-05 ENCOUNTER — Ambulatory Visit: Payer: BC Managed Care – PPO | Admitting: Endocrinology

## 2023-09-05 ENCOUNTER — Ambulatory Visit (HOSPITAL_BASED_OUTPATIENT_CLINIC_OR_DEPARTMENT_OTHER)
Admission: RE | Admit: 2023-09-05 | Discharge: 2023-09-05 | Disposition: A | Discharge status: Home / Self Care | Payer: BC Managed Care – PPO | Source: Ambulatory Visit | Attending: Cardiology | Admitting: Cardiology

## 2023-09-05 ENCOUNTER — Encounter: Payer: Self-pay | Admitting: Endocrinology

## 2023-09-05 ENCOUNTER — Encounter (HOSPITAL_BASED_OUTPATIENT_CLINIC_OR_DEPARTMENT_OTHER): Payer: Self-pay

## 2023-09-05 ENCOUNTER — Other Ambulatory Visit (HOSPITAL_COMMUNITY): Payer: Self-pay | Admitting: *Deleted

## 2023-09-05 VITALS — BP 132/80 | HR 95 | Resp 20 | Ht 78.6 in | Wt 276.2 lb

## 2023-09-05 DIAGNOSIS — Z794 Long term (current) use of insulin: Secondary | ICD-10-CM | POA: Diagnosis not present

## 2023-09-05 DIAGNOSIS — I259 Chronic ischemic heart disease, unspecified: Secondary | ICD-10-CM | POA: Diagnosis not present

## 2023-09-05 DIAGNOSIS — E1165 Type 2 diabetes mellitus with hyperglycemia: Secondary | ICD-10-CM | POA: Diagnosis not present

## 2023-09-05 DIAGNOSIS — I209 Angina pectoris, unspecified: Secondary | ICD-10-CM | POA: Insufficient documentation

## 2023-09-05 LAB — POCT GLYCOSYLATED HEMOGLOBIN (HGB A1C): HbA1c POC (<> result, manual entry): >15 % (ref 4.0–5.6)

## 2023-09-05 LAB — POCT I-STAT CREATININE: Creatinine, Ser: 1.1 mg/dL (ref 0.61–1.24)

## 2023-09-05 MED ORDER — DAPAGLIFLOZIN PROPANEDIOL 10 MG PO TABS: 10.0000 mg | ORAL_TABLET | Freq: Every day | ORAL | 3 refills | Status: AC

## 2023-09-05 MED ORDER — INSULIN PEN NEEDLE 32G X 4 MM MISC
3 refills | Status: DC
Start: 2023-09-05 — End: 2023-10-26

## 2023-09-05 MED ORDER — DILTIAZEM HCL 25 MG/5ML IV SOLN
10.0000 mg | INTRAVENOUS | Status: AC | PRN
  Administered 2023-09-05 (×2): 10 mg via INTRAVENOUS

## 2023-09-05 MED ORDER — SEMAGLUTIDE(0.25 OR 0.5MG/DOS) 2 MG/3ML ~~LOC~~ SOPN: PEN_INJECTOR | SUBCUTANEOUS | 4 refills | Status: DC

## 2023-09-05 MED ORDER — GLIMEPIRIDE 2 MG PO TABS: 2.0000 mg | ORAL_TABLET | Freq: Every day | ORAL | 3 refills | Status: DC

## 2023-09-05 MED ORDER — INSULIN ASP PROT & ASP FLEXPEN (70-30) 100 UNIT/ML ~~LOC~~ SUPN
PEN_INJECTOR | SUBCUTANEOUS | 3 refills | Status: DC
Start: 2023-09-05 — End: 2023-10-26

## 2023-09-05 MED ORDER — DEXCOM G7 SENSOR MISC: 1.0000 | 3 refills | Status: DC

## 2023-09-05 MED ORDER — IVABRADINE HCL 7.5 MG PO TABS: 15.0000 mg | ORAL_TABLET | Freq: Once | ORAL | 0 refills | Status: AC

## 2023-09-05 MED ORDER — METOPROLOL TARTRATE 100 MG PO TABS: 100.0000 mg | ORAL_TABLET | Freq: Once | ORAL | 0 refills | Status: DC

## 2023-09-05 MED ORDER — IOHEXOL 350 MG/ML SOLN: 100.0000 mL | Freq: Once | INTRAVENOUS | Status: DC | PRN

## 2023-09-05 MED ORDER — METOPROLOL TARTRATE 5 MG/5ML IV SOLN
10.0000 mg | Freq: Once | INTRAVENOUS | Status: AC | PRN
  Administered 2023-09-05: 10 mg via INTRAVENOUS

## 2023-09-05 MED ORDER — NITROGLYCERIN 0.4 MG SL SUBL: 0.8000 mg | SUBLINGUAL_TABLET | Freq: Once | SUBLINGUAL | Status: DC

## 2023-09-05 NOTE — Progress Notes (Signed)
 Patient presents for cardiac CT scan.  Resting HR 85 BP 158/93.  Patient given 10mg  metoprolol  + 20mg  diltiazem . HR 80 BP 147/87 (75 with breath hold).   Dr. Krasowski made aware.  Advised to r/s with ivabradine .   Patient and wife aware and verbalized understanding.  Patient denies symptoms and ambulatory to lobby.

## 2023-09-05 NOTE — Patient Instructions (Addendum)
 Diabetes regimen:  NovoLog  mix 35 units with breakfast and 45 units with supper. Continue Farxiga  10 mg daily. Start Ozempic  0.25 mg weekly for 4 weeks and increase to 0.5 mg weekly. Start glimepiride  2 mg daily.  I sent prescription for Dexcom G7, check with the pharmacy.  I sent referral to diabetic educator for starting CGM training and Dexcom.

## 2023-09-05 NOTE — Progress Notes (Signed)
 Outpatient Endocrinology Note [name], MD   Patient's Name: Lawrence Fisher    DOB: 1971-02-26    MRN: [id number]                                                    REASON OF VISIT: Follow up for type 2 diabetes mellitus  PCP: Neysa Tinnie BRAVO, PA  HISTORY OF PRESENT ILLNESS:   [name] is a 53 y.o. old male with past medical history listed below, is here for follow up for type 2 diabetes mellitus.   Pertinent Diabetes History: Patient was previously seen by Dr. Von and was last time seen in January 2024.  Patient was diagnosed with type 2 diabetes mellitus in 2015.  At the time of diagnosis he had symptoms of increased thirst, weakness and found to have hyperglycemia on ER visit.  Patient was not compliant with diabetes care, he has uncontrolled type 2 diabetes mellitus.  Recently he is being evaluated for foot surgery for callus and shoulder surgery.  He was last time seen this in clinic 1 year ago.  He presented today to reestablish care for diabetes control.  Chronic Diabetes Complications : Retinopathy: unknown. Last ophthalmology exam was done on Due, following with ophthalmology regularly.  Nephropathy: Microalbuminuria present, on ACE/ARB / lisinopril  Peripheral neuropathy: yes. Coronary artery disease: no Stroke: no  Relevant comorbidities and cardiovascular risk factors: Obesity: yes Body mass index is 31.43 kg/m.  Hypertension: Yes  Hyperlipidemia : Yes, on statin   Current / Home Diabetic regimen includes:  NSULIN regimen is:  Novolog  Mix 35 units before breakfast and 45 at  dinner  , mostly not taking the dinner dose.   Non-insulin  hypoglycemic drugs the patient is taking are: Farxiga  10 mg daily.  Ozempic  2 mg weekly, stopped by patient due to rapid weight loss and significant appetite suppression, about 1 year ago.  Prior diabetic medications: Metformin  in the past stopped due to diarrhea.  He used to be on Bydureon  as well.  Glycemic data:   He has  not been checking blood sugar at home.  No glucose data to review.  Hypoglycemia: Patient has  hypoglycemic episodes. Patient has hypoglycemia awareness.  Factors modifying glucose control: 1.  Diabetic diet assessment: 3 -4 meals a day.  2.  Staying active or exercising: No formal exercise.  3.  Medication compliance: compliant some of the time.  Interval history  Patient has uncontrolled type 2 diabetes mellitus.  Hemoglobin A1c today more than 15%.  No glucose data to review.  He has not been checking blood sugar at home.  He would be requiring foot surgery for callus and shoulder surgery, presented today to reestablish diabetes care and controlled.  He was last time seen in this clinic 1 year ago.  He has complaints of numbness and tingling of the feet.  He reports not fully compliant with insulin  regimen he is mostly taking in the morning only.  He has a plan to change diet and eat better.  He has complaints of increased thirst and urination.  He stopped taking Ozempic  about 1 year ago due to significant weight loss and significant loss of appetite.  He is accompanied by his wife in the clinic today.  REVIEW OF SYSTEMS As per history of present illness.   PAST MEDICAL HISTORY: Past  Medical History:  Diagnosis Date   Acquired bilateral hammer toes 11/18/2022   Chronic diarrhea 08/01/2016   Chronic fatigue 10/11/2021   Class 1 obesity with serious comorbidity and body mass index (BMI) of 33.0 to 33.9 in adult 10/18/2018   Closed displaced fracture of neck of fourth metacarpal bone of right hand with routine healing 06/22/2016   Closed displaced fracture of proximal phalanx of right ring finger with routine healing 06/22/2016   Diabetes mellitus without complication (HCC)    Diabetic neuropathy with neurologic complication (HCC) 10/18/2018   Erectile dysfunction due to diabetes mellitus (HCC) 06/02/2021   Generalized abdominal pain 08/01/2016   Heel cord tightness, right 03/27/2023    History of diabetic ulcer of foot 01/09/2023   Hypertension    Hypertension associated with type 2 diabetes mellitus (HCC) 08/01/2016   Incontinence of feces 08/01/2016   Microalbuminuria due to type 2 diabetes mellitus (HCC) 07/10/2018   Mixed hyperlipidemia 07/10/2018   Moderate episode of recurrent major depressive disorder (HCC) 08/01/2016   Neuropathy due to type 2 diabetes mellitus (HCC) 11/18/2022   Plantar callus 12/19/2022   Right shoulder pain 09/25/2018   Added automatically from request for surgery [id number]     Smoker 08/01/2016   Ulcer of right foot, limited to breakdown of skin (HCC) 11/18/2022   Uncontrolled type 2 diabetes mellitus with hyperglycemia, without long-term current use of insulin  (HCC) 12/29/2015   Unspecified dislocation of right shoulder joint, initial encounter 05/21/2018    MRI right shoulder-05/29/2018- changes consistent with history of anterior dislocation with subacute Hill-Sachs impaction fracture of the proximal humerus, displaced glenoid fracture and Bankart lesion with associated intra-articular loose bodies and extensive labral tearing, rotator cuff tendinopathy without tear, 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     Vitamin D deficiency 10/20/2021    PAST SURGICAL HISTORY: Past Surgical History:  Procedure Laterality Date   ANKLE SURGERY     CHOLECYSTECTOMY     OPEN REDUCTION INTERNAL FIXATION (ORIF) METACARPAL Right 06/02/2016   Procedure: OPEN REDUCTION INTERNAL FIXATION (ORIF) right ring phalanx and  METACARPAL fractures;  Surgeon: Franky Curia, MD;  Location: [facility];  Service: Orthopedics;  Laterality: Right;  OPEN REDUCTION INTERNAL FIXATION (ORIF) right ring phalanx and  METACARPAL fractures   TONSILLECTOMY      ALLERGIES: Allergies  Allergen Reactions   Metformin      Other Reaction(s): Diarrhea   Metformin  And Related Diarrhea    FAMILY HISTORY:  History reviewed. No pertinent family history.  SOCIAL  HISTORY: Social History   Socioeconomic History   Marital status: Married    Spouse name: Not on file   Number of children: Not on file   Years of education: Not on file   Highest education level: Not on file  Occupational History   Not on file  Tobacco Use   Smoking status: Every Day    Current packs/day: 0.50    Types: Cigarettes   Smokeless tobacco: Never  Vaping Use   Vaping status: Never Used  Substance and Sexual Activity   Alcohol use: Yes    Alcohol/week: 0.0 standard drinks of alcohol    Comment: occ   Drug use: Yes    Types: Marijuana    Comment: denies   Sexual activity: Not on file  Other Topics Concern   Not on file  Social History Narrative   ** Merged History Encounter **       Social Drivers of Health   Financial Resource Strain: Low Risk  (  11/11/2022)   Received from [facility], [facility]   Overall Financial Resource Strain (CARDIA)    Difficulty of Paying Living Expenses: Not hard at all  Food Insecurity: No Food Insecurity (11/11/2022)   Received from [facility], [facility]   Hunger Vital Sign    Worried About Running Out of Food in the Last Year: Never true    Ran Out of Food in the Last Year: Never true  Transportation Needs: No Transportation Needs (11/11/2022)   Received from [facility], [facility]   [facility] - Transportation    Lack of Transportation (Medical): No    Lack of Transportation (Non-Medical): No  Physical Activity: Not on file  Stress: Not on file  Social Connections: Unknown (01/03/2022)   Received from [facility], [facility]   Social Network    Social Network: Not on file    MEDICATIONS:  Current Outpatient Medications  Medication Sig Dispense Refill   Continuous Glucose Sensor (DEXCOM G7 SENSOR) MISC 1 Device by Does not apply route continuous. 9 each 3   glimepiride  (AMARYL ) 2 MG tablet Take 1 tablet (2 mg total) by mouth daily before breakfast. 90 tablet 3   Semaglutide ,0.25 or 0.5MG /DOS, 2  MG/3ML SOPN Inject 0.25 mg into the skin once a week for 4 weeks and increase to 0.5 mg weekly. 3 mL 4   amLODipine (NORVASC) 10 MG tablet Take 10 mg by mouth daily.     aspirin  EC 81 MG tablet Take 1 tablet (81 mg total) by mouth daily. Swallow whole. 90 tablet 3   BAYER MICROLET LANCETS lancets Use as instructed to check blood sugar 3 times per day dx code E11.65 100 each 3   Continuous Blood Gluc Sensor (FREESTYLE LIBRE 2 SENSOR) MISC USE EVERY 14 DAYS (Patient not taking: Reported on 09/05/2023) 2 each 3   Continuous Blood Gluc Transmit (DEXCOM G6 TRANSMITTER) MISC Use one every 90 days (Patient not taking: Reported on 09/05/2023) 1 each 3   dapagliflozin  propanediol (FARXIGA ) 10 MG TABS tablet Take 1 tablet (10 mg total) by mouth daily before breakfast. 90 tablet 3   glucose blood (BAYER CONTOUR NEXT TEST) test strip Use as instructed to check blood sugar 3 times per day Dx code E11.65 100 each 3   Insulin  Asp Prot & Asp FlexPen (NOVOLOG  70/30 MIX) (70-30) 100 UNIT/ML FlexPen INJECT 35 UNITS BEFORE BREAKFAST AND 45 UNITS BEFORE SUPPER 30 mL 3   Insulin  Pen Needle 32G X 4 MM MISC Use to inject insulin  2 times a day. 200 each 3   lisinopril  (ZESTRIL ) 10 MG tablet Take 10 mg by mouth daily.     metoprolol  tartrate (LOPRESSOR ) 100 MG tablet Take 1 tablet (100 mg total) by mouth once for 1 dose. Take 2 hours prior to your CT if your heart rate is greater than 55 1 tablet 0   nitroGLYCERIN  (NITROSTAT ) 0.4 MG SL tablet Place 1 tablet (0.4 mg total) under the tongue every 5 (five) minutes as needed. 25 tablet 6   sertraline (ZOLOFT) 100 MG tablet Take 100 mg by mouth daily.     No current facility-administered medications for this visit.   Facility-Administered Medications Ordered in Other Visits  Medication Dose Route Frequency Provider Last Rate Last Admin   diltiazem  (CARDIZEM ) injection 10 mg  10 mg Intravenous Q5 min PRN Krasowski, Robert J, MD   10 mg at 09/05/23 1205   iohexol  (OMNIPAQUE ) 350  MG/ML injection 100 mL  100 mL Intravenous Once PRN  Revankar, Jennifer SAUNDERS, MD       nitroGLYCERIN  (NITROSTAT ) SL tablet 0.8 mg  0.8 mg Sublingual Once Krasowski, Robert J, MD        PHYSICAL EXAM: Vitals:   09/05/23 0859  BP: 132/80  Pulse: 95  Resp: 20  SpO2: 96%  Weight: 276 lb 3.2 oz (125.3 kg)  Height: 6' 6.6 (1.996 m)   Body mass index is 31.43 kg/m.  Wt Readings from Last 3 Encounters:  09/05/23 276 lb 3.2 oz (125.3 kg)  08/02/23 276 lb (125.2 kg)  05/18/23 278 lb (126.1 kg)    General: Well developed, well nourished male in no apparent distress.  HEENT: AT/[place], no external lesions.  Eyes: Conjunctiva clear and no icterus. Neck: Neck supple  Lungs: Respirations not labored Neurologic: Alert, oriented, normal speech Extremities / Skin: Dry. No sores or rashes noted.  Psychiatric: Does not appear depressed or anxious  Diabetic Foot Exam - Simple   Simple Foot Form Diabetic Foot exam was performed with the following findings: Yes 09/05/2023  9:30 AM  Visual Inspection See comments: Yes Sensation Testing See comments: Yes Pulse Check See comments: Yes Comments DP 2 + bilaterally. Right foot with callus on sole and 2nd toes. No ulcer. Left foot callus on 2nd toe. No ulcer. Hammer toes present bilaterally. Monofilament exam significantly reduced bilaterally.      LABS Reviewed Lab Results  Component Value Date   HGBA1C >15.0 09/05/2023   HGBA1C 10.5 (H) 08/29/2022   HGBA1C 10.5 (H) 08/29/2022   Lab Results  Component Value Date   FRUCTOSAMINE 333 (H) 09/06/2021   FRUCTOSAMINE 389 (H) 12/15/2020   FRUCTOSAMINE 253 01/19/2017   Lab Results  Component Value Date   CHOL 152 11/17/2021   HDL 43.00 11/17/2021   LDLCALC 77 11/17/2021   TRIG 160.0 (H) 11/17/2021   CHOLHDL 4 11/17/2021   Lab Results  Component Value Date   MICRALBCREAT 59.7 (H) 08/29/2022   MICRALBCREAT 30.5 (H) 12/15/2020   Lab Results  Component Value Date   CREATININE 1.10 09/05/2023    Lab Results  Component Value Date   GFR 71.94 08/29/2022    ASSESSMENT / PLAN  1. Uncontrolled type 2 diabetes mellitus with hyperglycemia, with long-term current use of insulin  (HCC)     Diabetes Mellitus type 2, complicated by diabetic neuropathy and microalbuminuria. - Diabetic status / severity: Poorly controlled.  Lab Results  Component Value Date   HGBA1C >15.0 09/05/2023    - Hemoglobin A1c goal : <7%  Discussed about importance of diabetes control to prevent chronic complications including retinopathy, neuropathy and nephropathy.  Discussed about importance of compliance with diabetes regimen and also diet and exercise.  No glucose data to review.  He is not fully compliant with insulin  regimen, mostly not taking the evening dose.  Will resume insulin  as follows.  Will keep premixed insulin  for the better compliance rather than basal bolus regimen.  Restart Ozempic .  Will stay on relatively low dose of Ozempic  due to his history of significant weight loss and significant appetite loss.  Adjusted diabetes regimen as follows.  - Medications: See below. NovoLog  mix 35 units with breakfast and 45 units with supper. Continue Farxiga  10 mg daily. Start Ozempic  0.25 mg weekly for 4 weeks and increase to 0.5 mg weekly. Start glimepiride  2 mg daily.  I sent prescription for Dexcom G7, check with the pharmacy.  I sent referral to diabetic educator for starting CGM training and Dexcom.  - Home glucose testing:  Start CGM or check blood sugar before meals and at bedtime.  Sent prescription for Dexcom G7.  Asked to bring glucometer in the follow-up visit. - Discussed/ Gave Hypoglycemia treatment plan.  # Consult : Diabetes educator. # Annual urine for microalbuminuria/ creatinine ratio, no microalbuminuria currently, continue ACE/ARB /lisinopril .  Also on Farxiga . Last  Lab Results  Component Value Date   MICRALBCREAT 59.7 (H) 08/29/2022    # Foot check nightly /  neuropathy, continue podiatry.  # Annual dilated diabetic eye exams.  Advised for diabetic eye exam.  - Diet: Make healthy diabetic food choices - Life style / activity / exercise: Discussed.  2. Blood pressure  -  BP Readings from Last 1 Encounters:  09/05/23 (!) 158/93    - Control is in target.  - No change in current plans.  3. Lipid status / Hyperlipidemia - Last  Lab Results  Component Value Date   LDLCALC 77 11/17/2021   -Previously on atorvastatin, managed by primary care provider.  Diagnoses and all orders for this visit:  Uncontrolled type 2 diabetes mellitus with hyperglycemia, with long-term current use of insulin  (HCC) -     POCT glycosylated hemoglobin (Hb A1C) -     Amb Referral to Nutrition and Diabetic Education -     Insulin  Pen Needle 32G X 4 MM MISC; Use to inject insulin  2 times a day. -     Insulin  Asp Prot & Asp FlexPen (NOVOLOG  70/30 MIX) (70-30) 100 UNIT/ML FlexPen; INJECT 35 UNITS BEFORE BREAKFAST AND 45 UNITS BEFORE SUPPER -     dapagliflozin  propanediol (FARXIGA ) 10 MG TABS tablet; Take 1 tablet (10 mg total) by mouth daily before breakfast.  Other orders -     Continuous Glucose Sensor (DEXCOM G7 SENSOR) MISC; 1 Device by Does not apply route continuous. -     Semaglutide ,0.25 or 0.5MG /DOS, 2 MG/3ML SOPN; Inject 0.25 mg into the skin once a week for 4 weeks and increase to 0.5 mg weekly. -     glimepiride  (AMARYL ) 2 MG tablet; Take 1 tablet (2 mg total) by mouth daily before breakfast.    DISPOSITION Follow up in clinic in 6 weeks suggested.   All questions answered and patient verbalized understanding of the plan.  Aastha Dayley, MD [facility] Endocrinology [facility] [address] [place], [address] [place], [place] [address] Phone # [phone]  At least part of this note was generated using voice recognition software. Inadvertent word errors may have occurred, which were not recognized during the proofreading process.

## 2023-09-06 NOTE — Progress Notes (Signed)
 Samples of this drug Dexcom G7 sensors  were given to the patient, quantity 2, Lot Number [id number] x 2

## 2023-09-19 ENCOUNTER — Other Ambulatory Visit (HOSPITAL_COMMUNITY): Payer: Self-pay

## 2023-09-25 ENCOUNTER — Telehealth (HOSPITAL_COMMUNITY): Payer: Self-pay | Admitting: *Deleted

## 2023-09-25 NOTE — Telephone Encounter (Signed)
 Reaching out to patient to offer assistance regarding upcoming cardiac imaging study; spoke to spouse (ok per DPR) verbalizes understanding of appt date/time, parking situation and where to check in, pre-test NPO status and medications ordered, and verified current allergies; name and call back number provided for further questions should they arise Johney Frame RN Navigator Cardiac Imaging [facility] [phone] office [phone] cell

## 2023-09-26 ENCOUNTER — Ambulatory Visit (HOSPITAL_BASED_OUTPATIENT_CLINIC_OR_DEPARTMENT_OTHER): Admission: RE | Admit: 2023-09-26 | Payer: BC Managed Care – PPO | Source: Ambulatory Visit

## 2023-10-03 ENCOUNTER — Ambulatory Visit (HOSPITAL_BASED_OUTPATIENT_CLINIC_OR_DEPARTMENT_OTHER)
Admission: RE | Admit: 2023-10-03 | Discharge: 2023-10-03 | Disposition: A | Discharge status: Home / Self Care | Payer: BC Managed Care – PPO | Source: Ambulatory Visit | Attending: Cardiology | Admitting: Cardiology

## 2023-10-03 DIAGNOSIS — I259 Chronic ischemic heart disease, unspecified: Secondary | ICD-10-CM | POA: Diagnosis not present

## 2023-10-03 DIAGNOSIS — E1140 Type 2 diabetes mellitus with diabetic neuropathy, unspecified: Secondary | ICD-10-CM | POA: Diagnosis not present

## 2023-10-03 DIAGNOSIS — E1149 Type 2 diabetes mellitus with other diabetic neurological complication: Secondary | ICD-10-CM | POA: Diagnosis not present

## 2023-10-03 DIAGNOSIS — L089 Local infection of the skin and subcutaneous tissue, unspecified: Secondary | ICD-10-CM | POA: Diagnosis not present

## 2023-10-03 DIAGNOSIS — E11628 Type 2 diabetes mellitus with other skin complications: Secondary | ICD-10-CM | POA: Diagnosis not present

## 2023-10-03 DIAGNOSIS — I209 Angina pectoris, unspecified: Secondary | ICD-10-CM | POA: Diagnosis not present

## 2023-10-03 LAB — ECHOCARDIOGRAM COMPLETE
AR max vel: 2.49 cm2
AV Area VTI: 2.62 cm2
AV Area mean vel: 2.52 cm2
AV Mean grad: 6 mm[Hg]
AV Peak grad: 9.6 mm[Hg]
AV Vena cont: 0.3 cm
Ao pk vel: 1.55 m/s
Area-P 1/2: 3.6 cm2
Calc EF: 63.6 %
MV M vel: 1.61 m/s
MV Peak grad: 10.3 mm[Hg]
P 1/2 time: 1692 ms
S' Lateral: 3.2 cm
Single Plane A2C EF: 67.3 %
Single Plane A4C EF: 60.6 %

## 2023-10-05 ENCOUNTER — Other Ambulatory Visit: Payer: Self-pay

## 2023-10-06 ENCOUNTER — Ambulatory Visit: Payer: BC Managed Care – PPO | Admitting: Cardiology

## 2023-10-09 ENCOUNTER — Telehealth (HOSPITAL_COMMUNITY): Payer: Self-pay | Admitting: Emergency Medicine

## 2023-10-09 NOTE — Telephone Encounter (Signed)
Spoke with wife Reaching out to patient to offer assistance regarding upcoming cardiac imaging study; pt verbalizes understanding of appt date/time, parking situation and where to check in, pre-test NPO status and medications ordered, and verified current allergies; name and call back number provided for further questions should they arise Rockwell Alexandria RN Navigator Cardiac Imaging [facility] [phone] office [phone] cell

## 2023-10-10 ENCOUNTER — Ambulatory Visit (HOSPITAL_BASED_OUTPATIENT_CLINIC_OR_DEPARTMENT_OTHER)
Admission: RE | Admit: 2023-10-10 | Discharge: 2023-10-10 | Disposition: A | Discharge status: Home / Self Care | Payer: BC Managed Care – PPO | Source: Ambulatory Visit | Attending: Cardiology | Admitting: Cardiology

## 2023-10-10 ENCOUNTER — Encounter (HOSPITAL_BASED_OUTPATIENT_CLINIC_OR_DEPARTMENT_OTHER): Payer: Self-pay

## 2023-10-10 DIAGNOSIS — I209 Angina pectoris, unspecified: Secondary | ICD-10-CM | POA: Insufficient documentation

## 2023-10-10 DIAGNOSIS — I259 Chronic ischemic heart disease, unspecified: Secondary | ICD-10-CM | POA: Diagnosis not present

## 2023-10-10 MED ORDER — NITROGLYCERIN 0.4 MG SL SUBL
SUBLINGUAL_TABLET | SUBLINGUAL | Status: AC
  Filled 2023-10-10: qty 2

## 2023-10-10 MED ORDER — DILTIAZEM HCL 25 MG/5ML IV SOLN
10.0000 mg | INTRAVENOUS | Status: DC | PRN
  Administered 2023-10-10: 10 mg via INTRAVENOUS

## 2023-10-10 MED ORDER — NITROGLYCERIN 0.4 MG SL SUBL: 0.8000 mg | SUBLINGUAL_TABLET | Freq: Once | SUBLINGUAL | Status: DC

## 2023-10-10 MED ORDER — METOPROLOL TARTRATE 5 MG/5ML IV SOLN
10.0000 mg | Freq: Once | INTRAVENOUS | Status: AC | PRN
  Administered 2023-10-10: 10 mg via INTRAVENOUS

## 2023-10-10 MED ORDER — DILTIAZEM HCL 25 MG/5ML IV SOLN
INTRAVENOUS | Status: AC
  Filled 2023-10-10: qty 5

## 2023-10-10 MED ORDER — IOHEXOL 350 MG/ML SOLN
100.0000 mL | Freq: Once | INTRAVENOUS | Status: AC | PRN
  Administered 2023-10-10: 95 mL via INTRAVENOUS

## 2023-10-10 MED ORDER — METOPROLOL TARTRATE 5 MG/5ML IV SOLN
INTRAVENOUS | Status: AC
  Filled 2023-10-10: qty 20

## 2023-10-10 NOTE — Progress Notes (Signed)
 Pt tolerated exam without incident; vital signs within normal limits; pt denies lightheadedness or dizziness; encouraged to drink caffeine, eat meal; pt ambulatory to lobby steady gait noted

## 2023-10-10 NOTE — Progress Notes (Signed)
Phone call to [facility] regarding patients vitals after intra-scan medication protocol, HR 80 (75 with breath hold) BP 138/82. HR very regular, no ecotpy- Dr. Bing Matter decided to proceed with scan.   Rockwell Alexandria RN Navigator Cardiac Imaging [facility] [phone] Office  [phone] Cell

## 2023-10-11 DIAGNOSIS — L089 Local infection of the skin and subcutaneous tissue, unspecified: Secondary | ICD-10-CM | POA: Diagnosis not present

## 2023-10-11 DIAGNOSIS — E11628 Type 2 diabetes mellitus with other skin complications: Secondary | ICD-10-CM | POA: Diagnosis not present

## 2023-10-11 DIAGNOSIS — E1149 Type 2 diabetes mellitus with other diabetic neurological complication: Secondary | ICD-10-CM | POA: Diagnosis not present

## 2023-10-11 DIAGNOSIS — E1140 Type 2 diabetes mellitus with diabetic neuropathy, unspecified: Secondary | ICD-10-CM | POA: Diagnosis not present

## 2023-10-17 ENCOUNTER — Ambulatory Visit: Payer: BC Managed Care – PPO | Admitting: Endocrinology

## 2023-10-23 ENCOUNTER — Ambulatory Visit: Payer: BC Managed Care – PPO | Admitting: Nutrition

## 2023-10-25 DIAGNOSIS — E1149 Type 2 diabetes mellitus with other diabetic neurological complication: Secondary | ICD-10-CM | POA: Diagnosis not present

## 2023-10-25 DIAGNOSIS — E1140 Type 2 diabetes mellitus with diabetic neuropathy, unspecified: Secondary | ICD-10-CM | POA: Diagnosis not present

## 2023-10-25 DIAGNOSIS — L97512 Non-pressure chronic ulcer of other part of right foot with fat layer exposed: Secondary | ICD-10-CM | POA: Diagnosis not present

## 2023-10-26 ENCOUNTER — Encounter: Payer: Self-pay | Admitting: Cardiology

## 2023-10-26 ENCOUNTER — Other Ambulatory Visit: Payer: Self-pay

## 2023-10-26 ENCOUNTER — Encounter: Payer: Self-pay | Admitting: Emergency Medicine

## 2023-10-26 ENCOUNTER — Ambulatory Visit: Payer: BC Managed Care – PPO | Attending: Cardiology | Admitting: Cardiology

## 2023-10-26 VITALS — BP 146/82 | HR 95 | Ht 78.6 in | Wt 280.1 lb

## 2023-10-26 DIAGNOSIS — I209 Angina pectoris, unspecified: Secondary | ICD-10-CM

## 2023-10-26 DIAGNOSIS — I152 Hypertension secondary to endocrine disorders: Secondary | ICD-10-CM

## 2023-10-26 DIAGNOSIS — F17210 Nicotine dependence, cigarettes, uncomplicated: Secondary | ICD-10-CM

## 2023-10-26 DIAGNOSIS — E1159 Type 2 diabetes mellitus with other circulatory complications: Secondary | ICD-10-CM | POA: Diagnosis not present

## 2023-10-26 DIAGNOSIS — E1165 Type 2 diabetes mellitus with hyperglycemia: Secondary | ICD-10-CM

## 2023-10-26 DIAGNOSIS — I25119 Atherosclerotic heart disease of native coronary artery with unspecified angina pectoris: Secondary | ICD-10-CM | POA: Insufficient documentation

## 2023-10-26 DIAGNOSIS — E782 Mixed hyperlipidemia: Secondary | ICD-10-CM

## 2023-10-26 DIAGNOSIS — F17200 Nicotine dependence, unspecified, uncomplicated: Secondary | ICD-10-CM

## 2023-10-26 HISTORY — DX: Atherosclerotic heart disease of native coronary artery with unspecified angina pectoris: I25.119

## 2023-10-26 MED ORDER — INSULIN ASP PROT & ASP FLEXPEN (70-30) 100 UNIT/ML ~~LOC~~ SUPN: PEN_INJECTOR | SUBCUTANEOUS | 3 refills | Status: DC

## 2023-10-26 MED ORDER — SEMAGLUTIDE(0.25 OR 0.5MG/DOS) 2 MG/3ML ~~LOC~~ SOPN: PEN_INJECTOR | SUBCUTANEOUS | 4 refills | Status: DC

## 2023-10-26 MED ORDER — INSULIN PEN NEEDLE 32G X 4 MM MISC: 3 refills | Status: AC

## 2023-10-26 NOTE — Progress Notes (Signed)
 Cardiology Office Note:    Date:  10/26/2023   ID:  LYFE MONGER, DOB February 07, 1971, MRN [id number]  PCP:  Heron Nay, PA  Cardiologist:  Garwin Brothers, MD   Referring MD: Heron Nay, PA    ASSESSMENT:    1. Mixed hyperlipidemia   2. Angina pectoris (HCC)   3. Coronary artery disease involving native coronary artery of native heart with angina pectoris (HCC)   4. Hypertension associated with type 2 diabetes mellitus (HCC)   5. Smoker    PLAN:    In order of problems listed above:    Coronary artery disease and angina pectoris: Results of coronary angiography discussed with the patient at length.  Questions were answered to their satisfaction.  Follow-up recommendations were made to the patient.I discussed coronary angiography and left heart catheterization with the patient at extensive length. Procedure, benefits and potential risks were explained. Patient had multiple questions which were answered to the patient's satisfaction. Patient agreed and consented for the procedure. Further recommendations will be made based on the findings of the coronary angiography. In the interim. The patient has any significant symptoms he knows to go to the nearest emergency room. Essential hypertension: Blood pressure is stable and diet was emphasized.  Lifestyle modification urged.  He appears to be at bit anxious at this time Mixed lipidemia: On lipid-lowering medications followed by primary care.  Goal LDL should be less than 60. Cigarette smoker: I spent 5 minutes with the patient discussing solely about smoking. Smoking cessation was counseled. I suggested to the patient also different medications and pharmacological interventions. Patient is keen to try stopping on its own at this time. He will get back to me if he needs any further assistance in this matter. He has been given a letter to be off work till the test is done and subsequently will decide the needful.  He will be seen in  follow-up appointment after coronary angiography.   Medication Adjustments/Labs and Tests Ordered: Current medicines are reviewed at length with the patient today.  Concerns regarding medicines are outlined above.  Orders Placed This Encounter  Procedures   EKG 12-Lead   No orders of the defined types were placed in this encounter.    No chief complaint on file.    History of Present Illness:    MARISSA LOWREY is a 53 y.o. male.  Patient has past medical history of recently diagnosed significant coronary artery disease by CT coronary angiography, essential hypertension, mixed dyslipidemia and diabetes mellitus.  Unfortunately he is a smoker and has smoked for the past several years.  Past few days he tells me that he is unable to hypertension.  His wife accompanies him for this visit.  At the time of my evaluation, the patient is alert awake oriented and in no distress.  Past Medical History:  Diagnosis Date   Acquired bilateral hammer toes 11/18/2022   Angina pectoris (HCC) 08/02/2023   Chronic diarrhea 08/01/2016   Chronic fatigue 10/11/2021   Class 1 obesity with serious comorbidity and body mass index (BMI) of 33.0 to 33.9 in adult 10/18/2018   Closed displaced fracture of neck of fourth metacarpal bone of right hand with routine healing 06/22/2016   Closed displaced fracture of proximal phalanx of right ring finger with routine healing 06/22/2016   Diabetes mellitus without complication (HCC)    Diabetic neuropathy with neurologic complication (HCC) 10/18/2018   Erectile dysfunction due to diabetes mellitus (HCC) 06/02/2021   Generalized  abdominal pain 08/01/2016   Heel cord tightness, right 03/27/2023   History of diabetic ulcer of foot 01/09/2023   Hypertension    Hypertension associated with type 2 diabetes mellitus (HCC) 08/01/2016   Incontinence of feces 08/01/2016   Microalbuminuria due to type 2 diabetes mellitus (HCC) 07/10/2018   Mixed hyperlipidemia 07/10/2018    Moderate episode of recurrent major depressive disorder (HCC) 08/01/2016   Neuropathy due to type 2 diabetes mellitus (HCC) 11/18/2022   Plantar callus 12/19/2022   Right shoulder pain 09/25/2018   Added automatically from request for surgery [id number]     Smoker 08/01/2016   Ulcer of right foot, limited to breakdown of skin (HCC) 11/18/2022   Uncontrolled type 2 diabetes mellitus with hyperglycemia, without long-term current use of insulin (HCC) 12/29/2015   Unspecified dislocation of right shoulder joint, initial encounter 05/21/2018    MRI right shoulder-05/29/2018- changes consistent with history of anterior dislocation with subacute Hill-Sachs impaction fracture of the proximal humerus, displaced glenoid fracture and Bankart lesion with associated intra-articular loose bodies and extensive labral tearing, rotator cuff tendinopathy without tear, 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     Vitamin D deficiency 10/20/2021    Past Surgical History:  Procedure Laterality Date   ANKLE SURGERY     CHOLECYSTECTOMY     OPEN REDUCTION INTERNAL FIXATION (ORIF) METACARPAL Right 06/02/2016   Procedure: OPEN REDUCTION INTERNAL FIXATION (ORIF) right ring phalanx and  METACARPAL fractures;  Surgeon: Betha Loa, MD;  Location: [facility];  Service: Orthopedics;  Laterality: Right;  OPEN REDUCTION INTERNAL FIXATION (ORIF) right ring phalanx and  METACARPAL fractures   TONSILLECTOMY      Current Medications: Current Meds  Medication Sig   amLODipine (NORVASC) 10 MG tablet Take 10 mg by mouth daily.   aspirin EC 81 MG tablet Take 1 tablet (81 mg total) by mouth daily. Swallow whole.   BAYER MICROLET LANCETS lancets Use as instructed to check blood sugar 3 times per day dx code E11.65   Continuous Blood Gluc Sensor (FREESTYLE LIBRE 2 SENSOR) MISC USE EVERY 14 DAYS   Continuous Blood Gluc Transmit (DEXCOM G6 TRANSMITTER) MISC Use one every 90 days   Continuous Glucose Sensor (DEXCOM G7  SENSOR) MISC 1 Device by Does not apply route continuous.   dapagliflozin propanediol (FARXIGA) 10 MG TABS tablet Take 1 tablet (10 mg total) by mouth daily before breakfast.   glimepiride (AMARYL) 2 MG tablet Take 1 tablet (2 mg total) by mouth daily before breakfast.   glucose blood (BAYER CONTOUR NEXT TEST) test strip Use as instructed to check blood sugar 3 times per day Dx code E11.65   Insulin Asp Prot & Asp FlexPen (NOVOLOG 70/30 MIX) (70-30) 100 UNIT/ML FlexPen INJECT 35 UNITS BEFORE BREAKFAST AND 45 UNITS BEFORE SUPPER   Insulin Pen Needle 32G X 4 MM MISC Use to inject insulin 2 times a day.   lisinopril (ZESTRIL) 10 MG tablet Take 10 mg by mouth daily.   nitroGLYCERIN (NITROSTAT) 0.4 MG SL tablet Place 0.4 mg under the tongue every 5 (five) minutes as needed for chest pain.   Semaglutide,0.25 or 0.5MG /DOS, 2 MG/3ML SOPN Inject 0.25 mg into the skin once a week for 4 weeks and increase to 0.5 mg weekly.   sertraline (ZOLOFT) 100 MG tablet Take 100 mg by mouth daily.     Allergies:   Metformin and Metformin and related   Social History   Socioeconomic History   Marital status: Married  Spouse name: Not on file   Number of children: Not on file   Years of education: Not on file   Highest education level: Not on file  Occupational History   Not on file  Tobacco Use   Smoking status: Every Day    Current packs/day: 0.50    Types: Cigarettes   Smokeless tobacco: Never  Vaping Use   Vaping status: Never Used  Substance and Sexual Activity   Alcohol use: Yes    Alcohol/week: 0.0 standard drinks of alcohol    Comment: occ   Drug use: Yes    Types: Marijuana    Comment: denies   Sexual activity: Not on file  Other Topics Concern   Not on file  Social History Narrative   ** Merged History Encounter **       Social Drivers of Health   Financial Resource Strain: Low Risk  (10/03/2023)   Received from [facility]   Overall Financial Resource Strain (CARDIA)     Difficulty of Paying Living Expenses: Not hard at all  Food Insecurity: No Food Insecurity (10/03/2023)   Received from [facility]   Hunger Vital Sign    Worried About Running Out of Food in the Last Year: Never true    Ran Out of Food in the Last Year: Never true  Transportation Needs: No Transportation Needs (10/03/2023)   [facility]    Lack of Transportation (Medical): No    Lack of Transportation (Non-Medical): No  Physical Activity: Not on file  Stress: Not on file  Social Connections: Unknown (01/03/2022)   Received from [facility]   Social Network    Social Network: Not on file     Family History: The patient's family history includes Diabetes in his father and mother; Heart disease in his father and mother; Hypertension in his father and mother. There is no history of Cancer.  ROS:   Please see the history of present illness.    All other systems reviewed and are negative.  EKGs/Labs/Other Studies Reviewed:    The following studies were reviewed today: .Marland KitchenEKG Interpretation Date/Time:  Thursday October 26 2023 08:03:47 EST Ventricular Rate:  95 PR Interval:  142 QRS Duration:  96 QT Interval:  366 QTC Calculation: 459 R Axis:   65  Text Interpretation: Normal sinus rhythm Cannot rule out Anterior infarct , age undetermined ST & T wave abnormality, consider inferior ischemia When compared with ECG of 02-Aug-2023 09:01, No significant change was found Confirmed by Belva Crome [phone]) on 10/26/2023 8:34:15 AM     Recent Labs: 05/18/2023: ALT 22; B Natriuretic Peptide 29.3; BUN 12; Hemoglobin 15.6; Platelets 264; Potassium 4.3; Sodium 134 09/05/2023: Creatinine, Ser 1.10  Recent Lipid Panel    Component Value Date/Time   CHOL 152 11/17/2021 0832   TRIG 160.0 (H) 11/17/2021 0832   HDL 43.00 11/17/2021 0832   CHOLHDL 4 11/17/2021 0832   VLDL 32.0 11/17/2021 0832   LDLCALC 77 11/17/2021 0832     Physical Exam:    VS:  BP (!) 146/82   Pulse 95   Ht 6' 6.6" (1.996 m)   Wt 280 lb 1.3 oz (127 kg)   SpO2 97%   BMI 31.87 kg/m     Wt Readings from Last 3 Encounters:  10/26/23 280 lb 1.3 oz (127 kg)  09/05/23 276 lb 3.2 oz (125.3 kg)  08/02/23 276 lb (125.2 kg)     GEN: Patient  is in no acute distress HEENT: Normal NECK: No JVD; No carotid bruits LYMPHATICS: No lymphadenopathy CARDIAC: Hear sounds regular, 2/6 systolic murmur at the apex. RESPIRATORY:  Clear to auscultation without rales, wheezing or rhonchi  ABDOMEN: Soft, non-tender, non-distended MUSCULOSKELETAL:  No edema; No deformity  SKIN: Warm and dry NEUROLOGIC:  Alert and oriented x 3 PSYCHIATRIC:  Normal affect   Signed, Garwin Brothers, MD  10/26/2023 8:34 AM    [facility]

## 2023-10-26 NOTE — Patient Instructions (Addendum)
  [place] [facility] DEPT OF [facility] [place] [address] [place], [place] [address] [place] [address] Dept: [phone] Loc: [phone]  Lawrence Fisher  10/26/2023  You are scheduled for a Cardiac Catheterization on Thursday, March 13 with Dr. Alverda Skeans.  1. Please arrive at the [facility] (Main Entrance A) at [facility]: [address] [place], [place] [address] at 5:30 AM (This time is 2 hour(s) before your procedure to ensure your preparation).   Free valet parking service is available. You will check in at ADMITTING. The support person will be asked to wait in the waiting room.  It is OK to have someone drop you off and come back when you are ready to be discharged.    Special note: Every effort is made to have your procedure done on time. Please understand that emergencies sometimes delay scheduled procedures.  2. Diet: Do not eat solid foods after midnight.  The patient may have clear liquids until 5am upon the day of the procedure.  3. Labs: You will need to have blood drawn today. You will have a CBC and CMP drawn.   4. Medication instructions in preparation for your procedure:   Contrast Allergy: No  Do not take Semaglutide, Glimepiride, or Farxiga the morning of your cath.     On the morning of your procedure, take your Aspirin 81 mg and any morning medicines NOT listed above.  You may use sips of water.  5. Plan to go home the same day, you will only stay overnight if medically necessary. 6. Bring a current list of your medications and current insurance cards. 7. You MUST have a responsible person to drive you home. 8. Someone MUST be with you the first 24 hours after you arrive home or your discharge will be delayed. 9. Please wear clothes that are easy to get on and off and wear slip-on shoes.  Thank you for allowing [place] to care for you!   -- [facility]

## 2023-10-26 NOTE — Telephone Encounter (Signed)
 Pt's wife left a vm stating the pt never received pen needles, ozempic, or his novolog from the pharmacy. I called her back and let her know the prescriptions were sent on 09/05/23 with refills and she said she went to the pharmacy a few days ago and they told her they didn't receive those prescriptions but did receive the other ones sent on that day. Rx resent.

## 2023-10-26 NOTE — H&P (View-Only) (Signed)
 Cardiology Office Note:    Date:  10/26/2023   ID:  Lawrence Fisher, DOB February 07, 1971, MRN [id number]  PCP:  Heron Nay, PA  Cardiologist:  Garwin Brothers, MD   Referring MD: Heron Nay, PA    ASSESSMENT:    1. Mixed hyperlipidemia   2. Angina pectoris (HCC)   3. Coronary artery disease involving native coronary artery of native heart with angina pectoris (HCC)   4. Hypertension associated with type 2 diabetes mellitus (HCC)   5. Smoker    PLAN:    In order of problems listed above:    Coronary artery disease and angina pectoris: Results of coronary angiography discussed with the patient at length.  Questions were answered to their satisfaction.  Follow-up recommendations were made to the patient.I discussed coronary angiography and left heart catheterization with the patient at extensive length. Procedure, benefits and potential risks were explained. Patient had multiple questions which were answered to the patient's satisfaction. Patient agreed and consented for the procedure. Further recommendations will be made based on the findings of the coronary angiography. In the interim. The patient has any significant symptoms he knows to go to the nearest emergency room. Essential hypertension: Blood pressure is stable and diet was emphasized.  Lifestyle modification urged.  He appears to be at bit anxious at this time Mixed lipidemia: On lipid-lowering medications followed by primary care.  Goal LDL should be less than 60. Cigarette smoker: I spent 5 minutes with the patient discussing solely about smoking. Smoking cessation was counseled. I suggested to the patient also different medications and pharmacological interventions. Patient is keen to try stopping on its own at this time. He will get back to me if he needs any further assistance in this matter. He has been given a letter to be off work till the test is done and subsequently will decide the needful.  He will be seen in  follow-up appointment after coronary angiography.   Medication Adjustments/Labs and Tests Ordered: Current medicines are reviewed at length with the patient today.  Concerns regarding medicines are outlined above.  Orders Placed This Encounter  Procedures   EKG 12-Lead   No orders of the defined types were placed in this encounter.    No chief complaint on file.    History of Present Illness:    Lawrence Fisher is a 53 y.o. male.  Patient has past medical history of recently diagnosed significant coronary artery disease by CT coronary angiography, essential hypertension, mixed dyslipidemia and diabetes mellitus.  Unfortunately he is a smoker and has smoked for the past several years.  Past few days he tells me that he is unable to hypertension.  His wife accompanies him for this visit.  At the time of my evaluation, the patient is alert awake oriented and in no distress.  Past Medical History:  Diagnosis Date   Acquired bilateral hammer toes 11/18/2022   Angina pectoris (HCC) 08/02/2023   Chronic diarrhea 08/01/2016   Chronic fatigue 10/11/2021   Class 1 obesity with serious comorbidity and body mass index (BMI) of 33.0 to 33.9 in adult 10/18/2018   Closed displaced fracture of neck of fourth metacarpal bone of right hand with routine healing 06/22/2016   Closed displaced fracture of proximal phalanx of right ring finger with routine healing 06/22/2016   Diabetes mellitus without complication (HCC)    Diabetic neuropathy with neurologic complication (HCC) 10/18/2018   Erectile dysfunction due to diabetes mellitus (HCC) 06/02/2021   Generalized  abdominal pain 08/01/2016   Heel cord tightness, right 03/27/2023   History of diabetic ulcer of foot 01/09/2023   Hypertension    Hypertension associated with type 2 diabetes mellitus (HCC) 08/01/2016   Incontinence of feces 08/01/2016   Microalbuminuria due to type 2 diabetes mellitus (HCC) 07/10/2018   Mixed hyperlipidemia 07/10/2018    Moderate episode of recurrent major depressive disorder (HCC) 08/01/2016   Neuropathy due to type 2 diabetes mellitus (HCC) 11/18/2022   Plantar callus 12/19/2022   Right shoulder pain 09/25/2018   Added automatically from request for surgery [id number]     Smoker 08/01/2016   Ulcer of right foot, limited to breakdown of skin (HCC) 11/18/2022   Uncontrolled type 2 diabetes mellitus with hyperglycemia, without long-term current use of insulin (HCC) 12/29/2015   Unspecified dislocation of right shoulder joint, initial encounter 05/21/2018    MRI right shoulder-05/29/2018- changes consistent with history of anterior dislocation with subacute Hill-Sachs impaction fracture of the proximal humerus, displaced glenoid fracture and Bankart lesion with associated intra-articular loose bodies and extensive labral tearing, rotator cuff tendinopathy without tear, 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     Vitamin D deficiency 10/20/2021    Past Surgical History:  Procedure Laterality Date   ANKLE SURGERY     CHOLECYSTECTOMY     OPEN REDUCTION INTERNAL FIXATION (ORIF) METACARPAL Right 06/02/2016   Procedure: OPEN REDUCTION INTERNAL FIXATION (ORIF) right ring phalanx and  METACARPAL fractures;  Surgeon: Betha Loa, MD;  Location: [facility];  Service: Orthopedics;  Laterality: Right;  OPEN REDUCTION INTERNAL FIXATION (ORIF) right ring phalanx and  METACARPAL fractures   TONSILLECTOMY      Current Medications: Current Meds  Medication Sig   amLODipine (NORVASC) 10 MG tablet Take 10 mg by mouth daily.   aspirin EC 81 MG tablet Take 1 tablet (81 mg total) by mouth daily. Swallow whole.   BAYER MICROLET LANCETS lancets Use as instructed to check blood sugar 3 times per day dx code E11.65   Continuous Blood Gluc Sensor (FREESTYLE LIBRE 2 SENSOR) MISC USE EVERY 14 DAYS   Continuous Blood Gluc Transmit (DEXCOM G6 TRANSMITTER) MISC Use one every 90 days   Continuous Glucose Sensor (DEXCOM G7  SENSOR) MISC 1 Device by Does not apply route continuous.   dapagliflozin propanediol (FARXIGA) 10 MG TABS tablet Take 1 tablet (10 mg total) by mouth daily before breakfast.   glimepiride (AMARYL) 2 MG tablet Take 1 tablet (2 mg total) by mouth daily before breakfast.   glucose blood (BAYER CONTOUR NEXT TEST) test strip Use as instructed to check blood sugar 3 times per day Dx code E11.65   Insulin Asp Prot & Asp FlexPen (NOVOLOG 70/30 MIX) (70-30) 100 UNIT/ML FlexPen INJECT 35 UNITS BEFORE BREAKFAST AND 45 UNITS BEFORE SUPPER   Insulin Pen Needle 32G X 4 MM MISC Use to inject insulin 2 times a day.   lisinopril (ZESTRIL) 10 MG tablet Take 10 mg by mouth daily.   nitroGLYCERIN (NITROSTAT) 0.4 MG SL tablet Place 0.4 mg under the tongue every 5 (five) minutes as needed for chest pain.   Semaglutide,0.25 or 0.5MG /DOS, 2 MG/3ML SOPN Inject 0.25 mg into the skin once a week for 4 weeks and increase to 0.5 mg weekly.   sertraline (ZOLOFT) 100 MG tablet Take 100 mg by mouth daily.     Allergies:   Metformin and Metformin and related   Social History   Socioeconomic History   Marital status: Married  Spouse name: Not on file   Number of children: Not on file   Years of education: Not on file   Highest education level: Not on file  Occupational History   Not on file  Tobacco Use   Smoking status: Every Day    Current packs/day: 0.50    Types: Cigarettes   Smokeless tobacco: Never  Vaping Use   Vaping status: Never Used  Substance and Sexual Activity   Alcohol use: Yes    Alcohol/week: 0.0 standard drinks of alcohol    Comment: occ   Drug use: Yes    Types: Marijuana    Comment: denies   Sexual activity: Not on file  Other Topics Concern   Not on file  Social History Narrative   ** Merged History Encounter **       Social Drivers of Health   Financial Resource Strain: Low Risk  (10/03/2023)   Received from [facility]   Overall Financial Resource Strain (CARDIA)     Difficulty of Paying Living Expenses: Not hard at all  Food Insecurity: No Food Insecurity (10/03/2023)   Received from [facility]   Hunger Vital Sign    Worried About Running Out of Food in the Last Year: Never true    Ran Out of Food in the Last Year: Never true  Transportation Needs: No Transportation Needs (10/03/2023)   Received [facility] - [facility]    Lack of Transportation (Medical): No    Lack of Transportation (Non-Medical): No  Physical Activity: Not on file  Stress: Not on file  Social Connections: Unknown (01/03/2022)   Received from [facility]   Social Network    Social Network: Not on file     Family History: The patient's family history includes Diabetes in his father and mother; Heart disease in his father and mother; Hypertension in his father and mother. There is no history of Cancer.  ROS:   Please see the history of present illness.    All other systems reviewed and are negative.  EKGs/Labs/Other Studies Reviewed:    The following studies were reviewed today: .Marland KitchenEKG Interpretation Date/Time:  Thursday October 26 2023 08:03:47 EST Ventricular Rate:  95 PR Interval:  142 QRS Duration:  96 QT Interval:  366 QTC Calculation: 459 R Axis:   65  Text Interpretation: Normal sinus rhythm Cannot rule out Anterior infarct , age undetermined ST & T wave abnormality, consider inferior ischemia When compared with ECG of 02-Aug-2023 09:01, No significant change was found Confirmed by Belva Crome [phone]) on 10/26/2023 8:34:15 AM     Recent Labs: 05/18/2023: ALT 22; B Natriuretic Peptide 29.3; BUN 12; Hemoglobin 15.6; Platelets 264; Potassium 4.3; Sodium 134 09/05/2023: Creatinine, Ser 1.10  Recent Lipid Panel    Component Value Date/Time   CHOL 152 11/17/2021 0832   TRIG 160.0 (H) 11/17/2021 0832   HDL 43.00 11/17/2021 0832   CHOLHDL 4 11/17/2021 0832   VLDL 32.0 11/17/2021 0832   LDLCALC 77 11/17/2021 0832     Physical Exam:    VS:  BP (!) 146/82   Pulse 95   Ht 6' 6.6" (1.996 m)   Wt 280 lb 1.3 oz (127 kg)   SpO2 97%   BMI 31.87 kg/m     Wt Readings from Last 3 Encounters:  10/26/23 280 lb 1.3 oz (127 kg)  09/05/23 276 lb 3.2 oz (125.3 kg)  08/02/23 276 lb (125.2 kg)     GEN: Patient  is in no acute distress HEENT: Normal NECK: No JVD; No carotid bruits LYMPHATICS: No lymphadenopathy CARDIAC: Hear sounds regular, 2/6 systolic murmur at the apex. RESPIRATORY:  Clear to auscultation without rales, wheezing or rhonchi  ABDOMEN: Soft, non-tender, non-distended MUSCULOSKELETAL:  No edema; No deformity  SKIN: Warm and dry NEUROLOGIC:  Alert and oriented x 3 PSYCHIATRIC:  Normal affect   Signed, Garwin Brothers, MD  10/26/2023 8:34 AM    [facility]

## 2023-10-27 LAB — COMPREHENSIVE METABOLIC PANEL
ALT: 23 IU/L (ref 0–44)
AST: 19 IU/L (ref 0–40)
Albumin: 4.3 g/dL (ref 3.8–4.9)
Alkaline Phosphatase: 86 IU/L (ref 44–121)
BUN/Creatinine Ratio: 12 (ref 9–20)
BUN: 13 mg/dL (ref 6–24)
Bilirubin Total: 0.4 mg/dL (ref 0.0–1.2)
CO2: 21 mmol/L (ref 20–29)
Calcium: 9.5 mg/dL (ref 8.7–10.2)
Chloride: 98 mmol/L (ref 96–106)
Creatinine, Ser: 1.06 mg/dL (ref 0.76–1.27)
Globulin, Total: 2.8 g/dL (ref 1.5–4.5)
Glucose: 403 mg/dL — ABNORMAL HIGH (ref 70–99)
Potassium: 4.9 mmol/L (ref 3.5–5.2)
Sodium: 139 mmol/L (ref 134–144)
Total Protein: 7.1 g/dL (ref 6.0–8.5)
eGFR: 85 mL/min/{1.73_m2} (ref >59)

## 2023-10-27 LAB — CBC
Hematocrit: 52.4 % — ABNORMAL HIGH (ref 37.5–51.0)
Hemoglobin: 17 g/dL (ref 13.0–17.7)
MCH: 27.4 pg (ref 26.6–33.0)
MCHC: 32.4 g/dL (ref 31.5–35.7)
MCV: 84 fL (ref 79–97)
Platelets: 252 10*3/uL (ref 150–450)
RBC: 6.21 x10E6/uL — ABNORMAL HIGH (ref 4.14–5.80)
RDW: 13.9 % (ref 11.6–15.4)
WBC: 7.6 10*3/uL (ref 3.4–10.8)

## 2023-10-31 ENCOUNTER — Telehealth: Payer: Self-pay | Admitting: Cardiology

## 2023-10-31 ENCOUNTER — Telehealth: Payer: Self-pay | Admitting: *Deleted

## 2023-10-31 DIAGNOSIS — Z0279 Encounter for issue of other medical certificate: Secondary | ICD-10-CM

## 2023-10-31 NOTE — Telephone Encounter (Signed)
 Cardiac Catheterization scheduled at [facility] for: Thursday November 02, 2023 7:30 AM Arrival time [facility] Main Entrance A at: 5:30 AM  Nothing to eat after midnight prior to procedure, clear liquids until 5 AM day of procedure.  Medication instructions: -Hold:  Insulin-AM of procedure -1/2 usual Insulin HS prior to procedure  Farxiga/Glimepiride-AM of procedure  Semaglutide weekly -last dose 10/27/23 -Other usual morning medications can be taken with sips of water including aspirin 81 mg.  Plan to go home the same day, you will only stay overnight if medically necessary.  You must have responsible adult to drive you home.  Someone must be with you the first 24 hours after you arrive home.  Reviewed instructions with patient's (DPR), wife Delice Bison.

## 2023-10-31 NOTE — Telephone Encounter (Signed)
 Forms received on 10/31/23  $29 received via cash and placed in safe. Forms taken and one was completed, waiting on the other form to be completed, scanned into chart. CB

## 2023-11-02 ENCOUNTER — Ambulatory Visit (HOSPITAL_COMMUNITY)
Admission: RE | Admit: 2023-11-02 | Discharge: 2023-11-02 | Disposition: A | Discharge status: Home / Self Care | Attending: Internal Medicine | Admitting: Internal Medicine

## 2023-11-02 ENCOUNTER — Other Ambulatory Visit: Payer: Self-pay

## 2023-11-02 ENCOUNTER — Encounter (HOSPITAL_COMMUNITY)
Admission: RE | Disposition: A | Discharge status: Home / Self Care | Payer: Self-pay | Source: Home / Self Care | Attending: Internal Medicine

## 2023-11-02 DIAGNOSIS — I10 Essential (primary) hypertension: Secondary | ICD-10-CM | POA: Diagnosis not present

## 2023-11-02 DIAGNOSIS — I25119 Atherosclerotic heart disease of native coronary artery with unspecified angina pectoris: Secondary | ICD-10-CM

## 2023-11-02 DIAGNOSIS — F17210 Nicotine dependence, cigarettes, uncomplicated: Secondary | ICD-10-CM | POA: Insufficient documentation

## 2023-11-02 DIAGNOSIS — Z7985 Long-term (current) use of injectable non-insulin antidiabetic drugs: Secondary | ICD-10-CM | POA: Diagnosis not present

## 2023-11-02 DIAGNOSIS — E1159 Type 2 diabetes mellitus with other circulatory complications: Secondary | ICD-10-CM | POA: Diagnosis not present

## 2023-11-02 DIAGNOSIS — I209 Angina pectoris, unspecified: Secondary | ICD-10-CM

## 2023-11-02 DIAGNOSIS — I2510 Atherosclerotic heart disease of native coronary artery without angina pectoris: Secondary | ICD-10-CM | POA: Diagnosis not present

## 2023-11-02 DIAGNOSIS — R079 Chest pain, unspecified: Secondary | ICD-10-CM | POA: Diagnosis not present

## 2023-11-02 DIAGNOSIS — E782 Mixed hyperlipidemia: Secondary | ICD-10-CM | POA: Diagnosis not present

## 2023-11-02 DIAGNOSIS — E1169 Type 2 diabetes mellitus with other specified complication: Secondary | ICD-10-CM | POA: Insufficient documentation

## 2023-11-02 DIAGNOSIS — F17200 Nicotine dependence, unspecified, uncomplicated: Secondary | ICD-10-CM

## 2023-11-02 DIAGNOSIS — I152 Hypertension secondary to endocrine disorders: Secondary | ICD-10-CM

## 2023-11-02 HISTORY — PX: CORONARY PRESSURE/FFR WITH 3D MAPPING: CATH118309

## 2023-11-02 HISTORY — PX: LEFT HEART CATH AND CORONARY ANGIOGRAPHY: CATH118249

## 2023-11-02 LAB — GLUCOSE, CAPILLARY: Glucose-Capillary: 205 mg/dL — ABNORMAL HIGH (ref 70–99)

## 2023-11-02 SURGERY — LEFT HEART CATH AND CORONARY ANGIOGRAPHY
Anesthesia: LOCAL

## 2023-11-02 MED ORDER — VERAPAMIL HCL 2.5 MG/ML IV SOLN
INTRAVENOUS | Status: AC
  Filled 2023-11-02: qty 2

## 2023-11-02 MED ORDER — FENTANYL CITRATE (PF) 100 MCG/2ML IJ SOLN
INTRAMUSCULAR | Status: AC
  Filled 2023-11-02: qty 2

## 2023-11-02 MED ORDER — HYDRALAZINE HCL 20 MG/ML IJ SOLN: 10.0000 mg | INTRAMUSCULAR | Status: DC | PRN

## 2023-11-02 MED ORDER — SODIUM CHLORIDE 0.9% FLUSH: 3.0000 mL | INTRAVENOUS | Status: DC | PRN

## 2023-11-02 MED ORDER — HEPARIN (PORCINE) IN NACL 1000-0.9 UT/500ML-% IV SOLN
INTRAVENOUS | Status: DC | PRN
  Administered 2023-11-02: 1000 mL

## 2023-11-02 MED ORDER — MIDAZOLAM HCL 2 MG/2ML IJ SOLN
INTRAMUSCULAR | Status: AC
  Filled 2023-11-02: qty 2

## 2023-11-02 MED ORDER — ACETAMINOPHEN 325 MG PO TABS: 650.0000 mg | ORAL_TABLET | ORAL | Status: DC | PRN

## 2023-11-02 MED ORDER — HEPARIN SODIUM (PORCINE) 1000 UNIT/ML IJ SOLN
INTRAMUSCULAR | Status: DC | PRN
Start: 2023-11-02 — End: 2023-11-02
  Administered 2023-11-02: 5000 [IU] via INTRA_ARTERIAL

## 2023-11-02 MED ORDER — ASPIRIN 81 MG PO CHEW: 81.0000 mg | CHEWABLE_TABLET | ORAL | Status: DC

## 2023-11-02 MED ORDER — SODIUM CHLORIDE 0.9 % IV SOLN: 250.0000 mL | INTRAVENOUS | Status: DC | PRN

## 2023-11-02 MED ORDER — MIDAZOLAM HCL 2 MG/2ML IJ SOLN
INTRAMUSCULAR | Status: DC | PRN
  Administered 2023-11-02: 1 mg via INTRAVENOUS

## 2023-11-02 MED ORDER — SODIUM CHLORIDE 0.9% FLUSH: 3.0000 mL | Freq: Two times a day (BID) | INTRAVENOUS | Status: DC

## 2023-11-02 MED ORDER — FENTANYL CITRATE (PF) 100 MCG/2ML IJ SOLN
INTRAMUSCULAR | Status: DC | PRN
  Administered 2023-11-02: 25 ug via INTRAVENOUS

## 2023-11-02 MED ORDER — LIDOCAINE HCL (PF) 1 % IJ SOLN
INTRAMUSCULAR | Status: AC
  Filled 2023-11-02: qty 30

## 2023-11-02 MED ORDER — VERAPAMIL HCL 2.5 MG/ML IV SOLN
INTRAVENOUS | Status: DC | PRN
  Administered 2023-11-02: 10 mL via INTRA_ARTERIAL

## 2023-11-02 MED ORDER — IOHEXOL 350 MG/ML SOLN
INTRAVENOUS | Status: DC | PRN
  Administered 2023-11-02: 30 mL via INTRA_ARTERIAL

## 2023-11-02 MED ORDER — HEPARIN SODIUM (PORCINE) 1000 UNIT/ML IJ SOLN
INTRAMUSCULAR | Status: AC
  Filled 2023-11-02: qty 10

## 2023-11-02 MED ORDER — SODIUM CHLORIDE 0.9 % WEIGHT BASED INFUSION: 1.0000 mL/kg/h | INTRAVENOUS | Status: DC

## 2023-11-02 MED ORDER — ONDANSETRON HCL 4 MG/2ML IJ SOLN: 4.0000 mg | Freq: Four times a day (QID) | INTRAMUSCULAR | Status: DC | PRN

## 2023-11-02 MED ORDER — LABETALOL HCL 5 MG/ML IV SOLN: 10.0000 mg | INTRAVENOUS | Status: DC | PRN

## 2023-11-02 MED ORDER — SODIUM CHLORIDE 0.9 % WEIGHT BASED INFUSION: 3.0000 mL/kg/h | INTRAVENOUS | Status: AC

## 2023-11-02 MED ORDER — ISOSORBIDE MONONITRATE ER 30 MG PO TB24: 30.0000 mg | ORAL_TABLET | Freq: Every day | ORAL | 2 refills | Status: DC

## 2023-11-02 MED ORDER — LIDOCAINE HCL (PF) 1 % IJ SOLN
INTRAMUSCULAR | Status: DC | PRN
  Administered 2023-11-02: 2 mL

## 2023-11-02 SURGICAL SUPPLY — 9 items
CARD KEY FFR CATHWORX (MISCELLANEOUS) IMPLANT
CATH INFINITI AMBI 6FR TG (CATHETERS) IMPLANT
DEVICE RAD COMP TR BAND LRG (VASCULAR PRODUCTS) IMPLANT
FFR CATHWORX KEY CARD (MISCELLANEOUS) ×1 IMPLANT
GLIDESHEATH SLEND SS 6F .021 (SHEATH) IMPLANT
KIT SYRINGE INJ CVI SPIKEX1 (MISCELLANEOUS) IMPLANT
PACK CARDIAC CATHETERIZATION (CUSTOM PROCEDURE TRAY) ×1 IMPLANT
SET ATX-X65L (MISCELLANEOUS) IMPLANT
WIRE EMERALD 3MM-J .035X260CM (WIRE) IMPLANT

## 2023-11-02 NOTE — Progress Notes (Signed)
 TR BAND REMOVAL  LOCATION:    right radial  DEFLATED PER PROTOCOL:    Yes.    TIME BAND OFF / DRESSING APPLIED:    1020 Gauze dressing applied   SITE UPON ARRIVAL:    Level 0  SITE AFTER BAND REMOVAL:    Level 0  CIRCULATION SENSATION AND MOVEMENT:    Within Normal Limits   Yes.    COMMENTS:   no issues noted

## 2023-11-02 NOTE — Discharge Instructions (Signed)

## 2023-11-02 NOTE — Interval H&P Note (Signed)
 History and Physical Interval Note:  11/02/2023 6:51 AM  Lawrence Fisher  has presented today for surgery, with the diagnosis of abnormal CT.  The various methods of treatment have been discussed with the patient and family. After consideration of risks, benefits and other options for treatment, the patient has consented to  Procedure(s): LEFT HEART CATH AND CORONARY ANGIOGRAPHY (N/A) as a surgical intervention.  The patient's history has been reviewed, patient examined, no change in status, stable for surgery.  I have reviewed the patient's chart and labs.  Questions were answered to the patient's satisfaction.     Orbie Pyo

## 2023-11-03 ENCOUNTER — Encounter (HOSPITAL_COMMUNITY): Payer: Self-pay | Admitting: Internal Medicine

## 2023-11-08 ENCOUNTER — Encounter: Payer: Self-pay | Admitting: Endocrinology

## 2023-11-08 ENCOUNTER — Ambulatory Visit: Admitting: Endocrinology

## 2023-11-08 VITALS — BP 138/80 | HR 106 | Resp 20 | Ht 78.0 in | Wt 274.2 lb

## 2023-11-08 DIAGNOSIS — E1165 Type 2 diabetes mellitus with hyperglycemia: Secondary | ICD-10-CM

## 2023-11-08 DIAGNOSIS — Z794 Long term (current) use of insulin: Secondary | ICD-10-CM

## 2023-11-08 DIAGNOSIS — R5382 Chronic fatigue, unspecified: Secondary | ICD-10-CM | POA: Diagnosis not present

## 2023-11-08 MED ORDER — GLIMEPIRIDE 4 MG PO TABS
4.0000 mg | ORAL_TABLET | Freq: Every day | ORAL | 3 refills | Status: DC
Start: 2023-11-08 — End: 2024-06-24

## 2023-11-08 NOTE — Progress Notes (Unsigned)
 Outpatient Endocrinology Note [name], MD   Patient's Name: Lawrence Fisher    DOB: [birth year]    MRN: [id number]                                                    REASON OF VISIT: Follow up for type 2 diabetes mellitus  PCP: Heron Nay, PA  HISTORY OF PRESENT ILLNESS:   Lawrence Fisher is a 53 y.o. old male with past medical history listed below, is here for follow up for type 2 diabetes mellitus.   Pertinent Diabetes History: Patient was previously seen by Dr. Lucianne Muss and was last time seen in January 2024.  Patient was diagnosed with type 2 diabetes mellitus in 2015.  At the time of diagnosis he had symptoms of increased thirst, weakness and found to have hyperglycemia on ER visit.  He reestablish follow-up visit in January 2024.  Chronic Diabetes Complications : Retinopathy: unknown. Last ophthalmology exam was done on Due, following with ophthalmology regularly.  Nephropathy: Microalbuminuria present, on ACE/ARB / lisinopril Peripheral neuropathy: yes. Coronary artery disease: yes Stroke: no  Relevant comorbidities and cardiovascular risk factors: Obesity: yes Body mass index is 31.69 kg/m.  Hypertension: Yes  Hyperlipidemia : Yes, on statin   Current / Home Diabetic regimen includes:  NSULIN regimen is:  Novolog Mix 35 units before breakfast and 45 at  dinner  , mostly not taking the dinner dose.   Non-insulin hypoglycemic drugs the patient is taking are: Glimepiride 2 mg daily.   Prior diabetic medications: Metformin in the past stopped due to diarrhea.   He used to be on Bydureon as well.Ozempic 2 mg weekly, stopped by patient due to rapid weight loss and significant appetite suppression, about 1 year ago.  Glycemic data:   He has not been checking blood sugar at home.  No glucose data to review. 205.  He has Dexcom and continue using due to recent cardiology evaluations.  Hypoglycemia: Patient has  hypoglycemic episodes. Patient has hypoglycemia  awareness.  Factors modifying glucose control: 1.  Diabetic diet assessment: 3 -4 meals a day.  2.  Staying active or exercising: No formal exercise.  3.  Medication compliance: compliant some of the time.  Interval history   He reports he is mostly missing the evening dose of insulin.  About 3-4 times a week.  He reports taking glimepiride.  He has noticed some improvement in blood sugar.  He was using Dexcom CGM and not everything lately for the evaluation for heart, he was recently diagnosed with coronary artery disease at heart cath.  No glucose data to review.  He reports he is taking glimepiride however not taking Ozempic and [place].  Diabetes regimen as reviewed and noted above.  REVIEW OF SYSTEMS As per history of present illness.   PAST MEDICAL HISTORY: Past Medical History:  Diagnosis Date   Acquired bilateral hammer toes 11/18/2022   Angina pectoris (HCC) 08/02/2023   Chronic diarrhea 08/01/2016   Chronic fatigue 10/11/2021   Class 1 obesity with serious comorbidity and body mass index (BMI) of 33.0 to 33.9 in adult 10/18/2018   Closed displaced fracture of neck of fourth metacarpal bone of right hand with routine healing 06/22/2016   Closed displaced fracture of proximal phalanx of right ring finger with routine healing 06/22/2016   Diabetes  mellitus without complication (HCC)    Diabetic neuropathy with neurologic complication (HCC) 10/18/2018   Erectile dysfunction due to diabetes mellitus (HCC) 06/02/2021   Generalized abdominal pain 08/01/2016   Heel cord tightness, right 03/27/2023   History of diabetic ulcer of foot 01/09/2023   Hypertension    Hypertension associated with type 2 diabetes mellitus (HCC) 08/01/2016   Incontinence of feces 08/01/2016   Microalbuminuria due to type 2 diabetes mellitus (HCC) 07/10/2018   Mixed hyperlipidemia 07/10/2018   Moderate episode of recurrent major depressive disorder (HCC) 08/01/2016   Neuropathy due to type 2 diabetes  mellitus (HCC) 11/18/2022   Plantar callus 12/19/2022   Right shoulder pain 09/25/2018   Added automatically from request for surgery [id number]     Smoker 08/01/2016   Ulcer of right foot, limited to breakdown of skin (HCC) 11/18/2022   Uncontrolled type 2 diabetes mellitus with hyperglycemia, without long-term current use of insulin (HCC) 12/29/2015   Unspecified dislocation of right shoulder joint, initial encounter 05/21/2018    MRI right shoulder-05/29/2018- changes consistent with history of anterior dislocation with subacute Hill-Sachs impaction fracture of the proximal humerus, displaced glenoid fracture and Bankart lesion with associated intra-articular loose bodies and extensive labral tearing, rotator cuff tendinopathy without tear, 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     Vitamin D deficiency 10/20/2021    PAST SURGICAL HISTORY: Past Surgical History:  Procedure Laterality Date   ANKLE SURGERY     CHOLECYSTECTOMY     CORONARY PRESSURE/FFR WITH 3D MAPPING N/A 11/02/2023   Procedure: Coronary Pressure/FFR w/3D Mapping;  Surgeon: Orbie Pyo, MD;  Location: [facility] INVASIVE CV [facility];  Service: Cardiovascular;  Laterality: N/A;   LEFT HEART CATH AND CORONARY ANGIOGRAPHY N/A 11/02/2023   Procedure: LEFT HEART CATH AND CORONARY ANGIOGRAPHY;  Surgeon: Orbie Pyo, MD;  Location: [facility] INVASIVE CV LAB;  Service: Cardiovascular;  Laterality: N/A;   OPEN REDUCTION INTERNAL FIXATION (ORIF) METACARPAL Right 06/02/2016   Procedure: OPEN REDUCTION INTERNAL FIXATION (ORIF) right ring phalanx and  METACARPAL fractures;  Surgeon: Betha Loa, MD;  Location: [facility];  Service: Orthopedics;  Laterality: Right;  OPEN REDUCTION INTERNAL FIXATION (ORIF) right ring phalanx and  METACARPAL fractures   TONSILLECTOMY      ALLERGIES: Allergies  Allergen Reactions   Metformin Diarrhea    FAMILY HISTORY:  Family History  Problem Relation Age of Onset   Heart disease Mother     Hypertension Mother    Diabetes Mother    Diabetes Father    Heart disease Father    Hypertension Father    Cancer Neg Hx     SOCIAL HISTORY: Social History   Socioeconomic History   Marital status: Married    Spouse name: Not on file   Number of children: Not on file   Years of education: Not on file   Highest education level: Not on file  Occupational History   Not on file  Tobacco Use   Smoking status: Every Day    Current packs/day: 0.50    Types: Cigarettes   Smokeless tobacco: Never  Vaping Use   Vaping status: Never Used  Substance and Sexual Activity   Alcohol use: Yes    Alcohol/week: 0.0 standard drinks of alcohol    Comment: occ   Drug use: Yes    Types: Marijuana    Comment: denies   Sexual activity: Not on file  Other Topics Concern   Not on file  Social History Narrative   **  Merged History Encounter **       Social Drivers of Health   Financial Resource Strain: Low Risk  (10/03/2023)   Received from [facility]   Overall Financial Resource Strain (CARDIA)    Difficulty of Paying Living Expenses: Not hard at all  Food Insecurity: No Food Insecurity (10/03/2023)   Received from [facility]   Hunger Vital Sign    Worried About Running Out of Food in the Last Year: Never true    Ran Out of Food in the Last Year: Never true  Transportation Needs: No Transportation Needs (10/03/2023)   Received from [facility] - Transportation    Lack of Transportation (Medical): No    Lack of Transportation (Non-Medical): No  Physical Activity: Not on file  Stress: Not on file  Social Connections: Unknown (01/03/2022)   Received from [facility]   Social Network    Social Network: Not on file    MEDICATIONS:  Current Outpatient Medications  Medication Sig Dispense Refill   amLODipine (NORVASC) 10 MG tablet Take 10 mg by mouth daily.     aspirin EC 81 MG tablet Take 1 tablet (81 mg total) by mouth daily. Swallow whole. 90  tablet 3   atorvastatin (LIPITOR) 80 MG tablet Take 80 mg by mouth daily.     BAYER MICROLET LANCETS lancets Use as instructed to check blood sugar 3 times per day dx code E11.65 100 each 3   dapagliflozin propanediol (FARXIGA) 10 MG TABS tablet Take 1 tablet (10 mg total) by mouth daily before breakfast. 90 tablet 3   glucose blood (BAYER CONTOUR NEXT TEST) test strip Use as instructed to check blood sugar 3 times per day Dx code E11.65 100 each 3   Insulin Asp Prot & Asp FlexPen (NOVOLOG 70/30 MIX) (70-30) 100 UNIT/ML FlexPen INJECT 35 UNITS BEFORE BREAKFAST AND 45 UNITS BEFORE SUPPER 30 mL 3   Insulin Pen Needle 32G X 4 MM MISC Use to inject insulin 2 times a day. 200 each 3   isosorbide mononitrate (IMDUR) 30 MG 24 hr tablet Take 1 tablet (30 mg total) by mouth daily. 30 tablet 2   lisinopril (ZESTRIL) 40 MG tablet Take 40 mg by mouth daily.     Multiple Vitamins-Minerals (ADULT GUMMY PO) Take 1 capsule by mouth daily.     nitroGLYCERIN (NITROSTAT) 0.4 MG SL tablet Place 0.4 mg under the tongue every 5 (five) minutes as needed for chest pain.     Semaglutide,0.25 or 0.5MG /DOS, 2 MG/3ML SOPN Inject 0.25 mg into the skin once a week for 4 weeks and increase to 0.5 mg weekly. (Patient taking differently: Inject 0.5 mg into the skin every Friday.) 3 mL 4   sertraline (ZOLOFT) 100 MG tablet Take 150 mg by mouth daily.     Vitamin D, Ergocalciferol, (DRISDOL) 1.25 MG (50000 UNIT) CAPS capsule Take 50,000 Units by mouth every Tuesday.     Continuous Blood Gluc Sensor (FREESTYLE LIBRE 2 SENSOR) MISC USE EVERY 14 DAYS (Patient not taking: Reported on 11/08/2023) 2 each 3   Continuous Blood Gluc Transmit (DEXCOM G6 TRANSMITTER) MISC Use one every 90 days (Patient not taking: Reported on 11/08/2023) 1 each 3   Continuous Glucose Sensor (DEXCOM G7 SENSOR) MISC 1 Device by Does not apply route continuous. (Patient not taking: Reported on 11/08/2023) 9 each 3   glimepiride (AMARYL) 4 MG tablet Take 1 tablet (4  mg total) by mouth daily before breakfast. 90 tablet 3  No current facility-administered medications for this visit.    PHYSICAL EXAM: Vitals:   11/08/23 1320  BP: 138/80  Pulse: (!) 106  Resp: 20  SpO2: 97%  Weight: 274 lb 3.2 oz (124.4 kg)  Height: 6\' 6"  (1.981 m)    Body mass index is 31.69 kg/m.  Wt Readings from Last 3 Encounters:  11/08/23 274 lb 3.2 oz (124.4 kg)  11/02/23 280 lb (127 kg)  10/26/23 280 lb 1.3 oz (127 kg)    General: Well developed, well nourished male in no apparent distress.  HEENT: AT/[place], no external lesions.  Eyes: Conjunctiva clear and no icterus. Neck: Neck supple  Lungs: Respirations not labored Neurologic: Alert, oriented, normal speech Extremities / Skin: Dry. No sores or rashes noted.  Psychiatric: Does not appear depressed or anxious  Diabetic Foot Exam - Simple   No data filed      LABS Reviewed Lab Results  Component Value Date   HGBA1C >15.0 09/05/2023   HGBA1C 10.5 (H) 08/29/2022   HGBA1C 10.5 (H) 08/29/2022   Lab Results  Component Value Date   FRUCTOSAMINE 333 (H) 09/06/2021   FRUCTOSAMINE 389 (H) 12/15/2020   FRUCTOSAMINE 253 01/19/2017   Lab Results  Component Value Date   CHOL 152 11/17/2021   HDL 43.00 11/17/2021   LDLCALC 77 11/17/2021   TRIG 160.0 (H) 11/17/2021   CHOLHDL 4 11/17/2021   Lab Results  Component Value Date   MICRALBCREAT 59.7 (H) 08/29/2022   MICRALBCREAT 30.5 (H) 12/15/2020   Lab Results  Component Value Date   CREATININE 1.06 10/26/2023   Lab Results  Component Value Date   GFR 71.94 08/29/2022    ASSESSMENT / PLAN  1. Uncontrolled type 2 diabetes mellitus with hyperglycemia, with long-term current use of insulin (HCC)   2. Chronic fatigue      Diabetes Mellitus type 2, complicated by diabetic neuropathy and microalbuminuria. - Diabetic status / severity: Poorly controlled.  Lab Results  Component Value Date   HGBA1C >15.0 09/05/2023    - Hemoglobin A1c goal :  <7%  Discussed about importance of diabetes control to prevent chronic complications including retinopathy, neuropathy and nephropathy.  Discussed about importance of compliance with diabetes regimen and also diet and exercise.  No glucose data to review.  He is not fully compliant with insulin regimen, mostly not taking the evening dose.   Will keep premixed insulin for the better compliance rather than basal bolus regimen.  Will stay on relatively low dose of Ozempic due to his history of significant weight loss and significant appetite loss.  Adjusted diabetes regimen as follows.  - Medications: See below. NovoLog mix 35 units with breakfast and 45 units with supper. Start Farxiga 10 mg daily.  Asked to check with pharmacy. Start Ozempic 0.25 mg weekly for 4 weeks and increase to 0.5 mg weekly. Increase glimepiride 2 mg daily 4 mg daily.  - Home glucose testing: Stay on CGM or check blood sugar before meals and at bedtime.    - Discussed/ Gave Hypoglycemia treatment plan.  # Consult : Not required at this time. # Annual urine for microalbuminuria/ creatinine ratio, + microalbuminuria currently, continue ACE/ARB /lisinopril.  Also on Farxiga.  Will check urine microalbumin creatinine ratio today. Last  Lab Results  Component Value Date   MICRALBCREAT 59.7 (H) 08/29/2022    # Foot check nightly / neuropathy, continue podiatry.  # Annual dilated diabetic eye exams.  Advised for diabetic eye exam.  - Diet: Make healthy  diabetic food choices - Life style / activity / exercise: Discussed.  2. Blood pressure  -  BP Readings from Last 1 Encounters:  11/08/23 138/80    - Control is in target.  - No change in current plans.  3. Lipid status / Hyperlipidemia - Last  Lab Results  Component Value Date   LDLCALC 77 11/17/2021   -Previously on atorvastatin, managed by primary care provider.  # Chronic fatigue -Will check thyroid function test today.  Diagnoses and all orders for  this visit:  Uncontrolled type 2 diabetes mellitus with hyperglycemia, with long-term current use of insulin (HCC) -     glimepiride (AMARYL) 4 MG tablet; Take 1 tablet (4 mg total) by mouth daily before breakfast. -     Microalbumin / creatinine urine ratio  Chronic fatigue -     T4, free -     TSH     DISPOSITION Follow up in clinic in 6 weeks suggested.   All questions answered and patient verbalized understanding of the plan.  [place] Jessica Checketts, MD [facility] [address] [place], [address] [place], [place] [address] Phone # [phone]  At least part of this note was generated using voice recognition software. Inadvertent word errors may have occurred, which were not recognized during the proofreading process.

## 2023-11-17 DIAGNOSIS — E1149 Type 2 diabetes mellitus with other diabetic neurological complication: Secondary | ICD-10-CM | POA: Diagnosis not present

## 2023-11-17 DIAGNOSIS — E1140 Type 2 diabetes mellitus with diabetic neuropathy, unspecified: Secondary | ICD-10-CM | POA: Diagnosis not present

## 2023-11-17 DIAGNOSIS — L97511 Non-pressure chronic ulcer of other part of right foot limited to breakdown of skin: Secondary | ICD-10-CM | POA: Diagnosis not present

## 2023-11-21 ENCOUNTER — Ambulatory Visit: Payer: BC Managed Care – PPO | Admitting: Cardiology

## 2023-11-28 ENCOUNTER — Ambulatory Visit: Payer: BC Managed Care – PPO | Admitting: Endocrinology

## 2023-11-30 ENCOUNTER — Ambulatory Visit: Attending: Cardiology | Admitting: Cardiology

## 2023-11-30 ENCOUNTER — Encounter: Payer: Self-pay | Admitting: Cardiology

## 2023-11-30 VITALS — BP 138/78 | HR 98 | Ht 78.0 in | Wt 280.1 lb

## 2023-11-30 DIAGNOSIS — I152 Hypertension secondary to endocrine disorders: Secondary | ICD-10-CM

## 2023-11-30 DIAGNOSIS — E66811 Obesity, class 1: Secondary | ICD-10-CM | POA: Insufficient documentation

## 2023-11-30 DIAGNOSIS — F17200 Nicotine dependence, unspecified, uncomplicated: Secondary | ICD-10-CM | POA: Diagnosis not present

## 2023-11-30 DIAGNOSIS — E782 Mixed hyperlipidemia: Secondary | ICD-10-CM | POA: Diagnosis not present

## 2023-11-30 DIAGNOSIS — E1159 Type 2 diabetes mellitus with other circulatory complications: Secondary | ICD-10-CM

## 2023-11-30 DIAGNOSIS — I25119 Atherosclerotic heart disease of native coronary artery with unspecified angina pectoris: Secondary | ICD-10-CM

## 2023-11-30 HISTORY — DX: Obesity, class 1: E66.811

## 2023-11-30 NOTE — Patient Instructions (Signed)
 Medication Instructions:  Your physician recommends that you continue on your current medications as directed. Please refer to the Current Medication list given to you today.  *If you need a refill on your cardiac medications before your next appointment, please call your pharmacy*  Lab Work: Your physician recommends that you return for lab work in:   Labs in 1 month: BMP, LFT, Lipids  If you have labs (blood work) drawn today and your tests are completely normal, you will receive your results only by: MyChart Message (if you have MyChart) OR A paper copy in the mail If you have any lab test that is abnormal or we need to change your treatment, we will call you to review the results.  Testing/Procedures: None  Follow-Up: At [facility], you and your health needs are our priority.  As part of our continuing mission to provide you with exceptional heart care, our providers are all part of one team.  This team includes your primary Cardiologist (physician) and Advanced Practice Providers or APPs (Physician Assistants and Nurse Practitioners) who all work together to provide you with the care you need, when you need it.  Your next appointment:   9 month(s)  Provider:   Belva Crome, MD    We recommend signing up for the patient portal called "MyChart".  Sign up information is provided on this After Visit Summary.  MyChart is used to connect with patients for Virtual Visits (Telemedicine).  Patients are able to view lab/test results, encounter notes, upcoming appointments, etc.  Non-urgent messages can be sent to your provider as well.   To learn more about what you can do with MyChart, go to [email or web address].   Other Instructions None

## 2023-11-30 NOTE — Progress Notes (Signed)
 Cardiology Office Note:    Date:  11/30/2023   ID:  Lawrence Fisher, DOB 25-Dec-1970, MRN [id number]  PCP:  Heron Nay, PA  Cardiologist:  Garwin Brothers, MD   Referring MD: Heron Nay, PA    ASSESSMENT:    1. Coronary artery disease involving native coronary artery of native heart with angina pectoris (HCC)   2. Hypertension associated with type 2 diabetes mellitus (HCC)   3. Mixed hyperlipidemia   4. Smoker   5. Obesity (BMI 30.0-34.9)    PLAN:    In order of problems listed above:  Coronary artery disease: Coronary angiography report discussed with the patient at length.  Questions were answered to satisfaction.  Secondary prevention stressed.  Importance of compliance with diet medication stressed any vocalized understanding. Essential hypertension: Blood pressure is stable and diet was emphasized and lifestyle modification urged Mixed dyslipidemia: On lipid-lowering medications.  Told him to come back in 1 month for follow-up lipid check.  Goal LDL less than 60. Diabetes mellitus and obesity: Lifestyle modification urged weight reduction stressed risks of obesity explained and told him that he needs to follow-up very closely with primary care about A1c management. Cigarette smoker: I spent 5 minutes with the patient discussing solely about smoking. Smoking cessation was counseled. I suggested to the patient also different medications and pharmacological interventions. Patient is keen to try stopping on its own at this time. He will get back to me if he needs any further assistance in this matter. Patient will be seen in follow-up appointment in 12 months or earlier if the patient has any concerns.    Medication Adjustments/Labs and Tests Ordered: Current medicines are reviewed at length with the patient today.  Concerns regarding medicines are outlined above.  No orders of the defined types were placed in this encounter.  No orders of the defined types were placed in  this encounter.    No chief complaint on file.    History of Present Illness:    Lawrence Fisher is a 53 y.o. male.  Patient has past medical history of recently diagnosed nonobstructive coronary artery disease by coronary angiography, essential hypertension, mixed dyslipidemia and diabetes mellitus.  Unfortunately continues to smoke 2 years though he has cut down on smoking.  At the time of my evaluation, the patient is alert awake oriented and in no distress.  He is using exercise bicycle at home.  His wife accompanies him for this visit.  Past Medical History:  Diagnosis Date   Acquired bilateral hammer toes 11/18/2022   Angina pectoris (HCC) 08/02/2023   Chronic diarrhea 08/01/2016   Chronic fatigue 10/11/2021   Class 1 obesity with serious comorbidity and body mass index (BMI) of 33.0 to 33.9 in adult 10/18/2018   Closed displaced fracture of neck of fourth metacarpal bone of right hand with routine healing 06/22/2016   Closed displaced fracture of proximal phalanx of right ring finger with routine healing 06/22/2016   Coronary artery disease with angina pectoris (HCC) 10/26/2023   Diabetes mellitus without complication (HCC)    Diabetic neuropathy with neurologic complication (HCC) 10/18/2018   Erectile dysfunction due to diabetes mellitus (HCC) 06/02/2021   Generalized abdominal pain 08/01/2016   Heel cord tightness, right 03/27/2023   History of diabetic ulcer of foot 01/09/2023   Hypertension    Hypertension associated with type 2 diabetes mellitus (HCC) 08/01/2016   Incontinence of feces 08/01/2016   Microalbuminuria due to type 2 diabetes mellitus (HCC) 07/10/2018  Mixed hyperlipidemia 07/10/2018   Moderate episode of recurrent major depressive disorder (HCC) 08/01/2016   Neuropathy due to type 2 diabetes mellitus (HCC) 11/18/2022   Plantar callus 12/19/2022   Right shoulder pain 09/25/2018   Added automatically from request for surgery [id number]     Smoker 08/01/2016    Ulcer of right foot, limited to breakdown of skin (HCC) 11/18/2022   Uncontrolled type 2 diabetes mellitus with hyperglycemia, without long-term current use of insulin (HCC) 12/29/2015   Unspecified dislocation of right shoulder joint, initial encounter 05/21/2018    MRI right shoulder-05/29/2018- changes consistent with history of anterior dislocation with subacute Hill-Sachs impaction fracture of the proximal humerus, displaced glenoid fracture and Bankart lesion with associated intra-articular loose bodies and extensive labral tearing, rotator cuff tendinopathy without tear, 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     Vitamin D deficiency 10/20/2021    Past Surgical History:  Procedure Laterality Date   ANKLE SURGERY     CHOLECYSTECTOMY     CORONARY PRESSURE/FFR WITH 3D MAPPING N/A 11/02/2023   Procedure: Coronary Pressure/FFR w/3D Mapping;  Surgeon: Orbie Pyo, MD;  Location: [facility];  Service: Cardiovascular;  Laterality: N/A;   LEFT HEART CATH AND CORONARY ANGIOGRAPHY N/A 11/02/2023   Procedure: LEFT HEART CATH AND CORONARY ANGIOGRAPHY;  Surgeon: Orbie Pyo, MD;  Location: [facility];  Service: Cardiovascular;  Laterality: N/A;   OPEN REDUCTION INTERNAL FIXATION (ORIF) METACARPAL Right 06/02/2016   Procedure: OPEN REDUCTION INTERNAL FIXATION (ORIF) right ring phalanx and  METACARPAL fractures;  Surgeon: Betha Loa, MD;  Location: [facility];  Service: Orthopedics;  Laterality: Right;  OPEN REDUCTION INTERNAL FIXATION (ORIF) right ring phalanx and  METACARPAL fractures   TONSILLECTOMY      Current Medications: Current Meds  Medication Sig   amLODipine (NORVASC) 10 MG tablet Take 10 mg by mouth daily.   aspirin EC 81 MG tablet Take 1 tablet (81 mg total) by mouth daily. Swallow whole.   atorvastatin (LIPITOR) 80 MG tablet Take 80 mg by mouth daily.   BAYER MICROLET LANCETS lancets Use as instructed to check blood sugar 3 times per day dx code  E11.65   Continuous Blood Gluc Sensor (FREESTYLE LIBRE 2 SENSOR) MISC USE EVERY 14 DAYS   Continuous Blood Gluc Transmit (DEXCOM G6 TRANSMITTER) MISC Use one every 90 days   Continuous Glucose Sensor (DEXCOM G7 SENSOR) MISC 1 Device by Does not apply route continuous.   dapagliflozin propanediol (FARXIGA) 10 MG TABS tablet Take 1 tablet (10 mg total) by mouth daily before breakfast.   glimepiride (AMARYL) 4 MG tablet Take 1 tablet (4 mg total) by mouth daily before breakfast.   glucose blood (BAYER CONTOUR NEXT TEST) test strip Use as instructed to check blood sugar 3 times per day Dx code E11.65   Insulin Asp Prot & Asp FlexPen (NOVOLOG 70/30 MIX) (70-30) 100 UNIT/ML FlexPen INJECT 35 UNITS BEFORE BREAKFAST AND 45 UNITS BEFORE SUPPER   Insulin Pen Needle 32G X 4 MM MISC Use to inject insulin 2 times a day.   isosorbide mononitrate (IMDUR) 30 MG 24 hr tablet Take 1 tablet (30 mg total) by mouth daily.   lisinopril (ZESTRIL) 40 MG tablet Take 40 mg by mouth daily.   Multiple Vitamins-Minerals (ADULT GUMMY PO) Take 1 capsule by mouth daily.   nitroGLYCERIN (NITROSTAT) 0.4 MG SL tablet Place 0.4 mg under the tongue every 5 (five) minutes as needed for chest pain.   Semaglutide,0.25 or  0.5MG /DOS, 2 MG/3ML SOPN Inject 0.25 mg into the skin once a week for 4 weeks and increase to 0.5 mg weekly.   sertraline (ZOLOFT) 100 MG tablet Take 150 mg by mouth daily.   Vitamin D, Ergocalciferol, (DRISDOL) 1.25 MG (50000 UNIT) CAPS capsule Take 50,000 Units by mouth every Tuesday.     Allergies:   Metformin   Social History   Socioeconomic History   Marital status: Married    Spouse name: Not on file   Number of children: Not on file   Years of education: Not on file   Highest education level: Not on file  Occupational History   Not on file  Tobacco Use   Smoking status: Every Day    Current packs/day: 0.50    Types: Cigarettes   Smokeless tobacco: Never  Vaping Use   Vaping status: Never Used   Substance and Sexual Activity   Alcohol use: Yes    Alcohol/week: 0.0 standard drinks of alcohol    Comment: occ   Drug use: Yes    Types: Marijuana    Comment: denies   Sexual activity: Not on file  Other Topics Concern   Not on file  Social History Narrative   ** Merged History Encounter **       Social Drivers of Health   Financial Resource Strain: Low Risk  (10/03/2023)   Received from [facility], [facility]   Overall Financial Resource Strain (CARDIA)    Difficulty of Paying Living Expenses: Not hard at all  Food Insecurity: No Food Insecurity (10/03/2023)   Received from [facility], [facility]   Hunger Vital Sign    Worried About Running Out of Food in the Last Year: Never true    Ran Out of Food in the Last Year: Never true  Transportation Needs: No Transportation Needs (10/03/2023)   Received from [facility], [facility]   PRAPARE - Transportation    Lack of Transportation (Medical): No    Lack of Transportation (Non-Medical): No  Physical Activity: Not on file  Stress: Not on file  Social Connections: Unknown (01/03/2022)   Received from [facility], [facility]   Social Network    Social Network: Not on file     Family History: The patient's family history includes Diabetes in his father and mother; Heart disease in his father and mother; Hypertension in his father and mother. There is no history of Cancer.  ROS:   Please see the history of present illness.    All other systems reviewed and are negative.  EKGs/Labs/Other Studies Reviewed:    The following studies were reviewed today: I discussed my findings with the patient at length   Recent Labs: 05/18/2023: B Natriuretic Peptide 29.3 10/26/2023: ALT 23; BUN 13; Creatinine, Ser 1.06; Hemoglobin 17.0; Platelets 252; Potassium 4.9; Sodium 139  Recent Lipid Panel    Component Value Date/Time   CHOL 152 11/17/2021 0832   TRIG 160.0 (H) 11/17/2021 0832   HDL 43.00 11/17/2021 0832    CHOLHDL 4 11/17/2021 0832   VLDL 32.0 11/17/2021 0832   LDLCALC 77 11/17/2021 0832    Physical Exam:    VS:  BP 138/78   Pulse 98   Ht 6\' 6"  (1.981 m)   Wt 280 lb 1.3 oz (127 kg)   SpO2 97%   BMI 32.37 kg/m     Wt Readings from Last 3 Encounters:  11/30/23 280 lb 1.3 oz (127 kg)  11/08/23 274 lb 3.2 oz (124.4  kg)  11/02/23 280 lb (127 kg)     GEN: Patient is in no acute distress HEENT: Normal NECK: No JVD; No carotid bruits LYMPHATICS: No lymphadenopathy CARDIAC: Hear sounds regular, 2/6 systolic murmur at the apex. RESPIRATORY:  Clear to auscultation without rales, wheezing or rhonchi  ABDOMEN: Soft, non-tender, non-distended MUSCULOSKELETAL:  No edema; No deformity  SKIN: Warm and dry NEUROLOGIC:  Alert and oriented x 3 PSYCHIATRIC:  Normal affect   Signed, Garwin Brothers, MD  11/30/2023 11:07 AM    [facility]

## 2023-12-08 ENCOUNTER — Telehealth: Payer: Self-pay | Admitting: Cardiology

## 2023-12-08 ENCOUNTER — Encounter: Payer: Self-pay | Admitting: Cardiology

## 2023-12-08 DIAGNOSIS — M79671 Pain in right foot: Secondary | ICD-10-CM | POA: Diagnosis not present

## 2023-12-08 DIAGNOSIS — L84 Corns and callosities: Secondary | ICD-10-CM | POA: Diagnosis not present

## 2023-12-08 DIAGNOSIS — M216X1 Other acquired deformities of right foot: Secondary | ICD-10-CM | POA: Diagnosis not present

## 2023-12-08 DIAGNOSIS — E1149 Type 2 diabetes mellitus with other diabetic neurological complication: Secondary | ICD-10-CM | POA: Diagnosis not present

## 2023-12-08 DIAGNOSIS — E1140 Type 2 diabetes mellitus with diabetic neuropathy, unspecified: Secondary | ICD-10-CM | POA: Diagnosis not present

## 2023-12-08 NOTE — Telephone Encounter (Signed)
 Advised letter has been printed and ready to mail. Advised that letter can be printed from MyChart.

## 2023-12-08 NOTE — Telephone Encounter (Signed)
 Patient's spouse called and said that patient need a letter stating that he can start going back to work immediately. Said that they need the letter asap and it can be mailed to their address. Would like a call back for verification of message

## 2023-12-24 DIAGNOSIS — R1031 Right lower quadrant pain: Secondary | ICD-10-CM | POA: Diagnosis not present

## 2023-12-24 DIAGNOSIS — R1909 Other intra-abdominal and pelvic swelling, mass and lump: Secondary | ICD-10-CM | POA: Diagnosis not present

## 2023-12-24 DIAGNOSIS — N492 Inflammatory disorders of scrotum: Secondary | ICD-10-CM | POA: Diagnosis not present

## 2023-12-27 ENCOUNTER — Encounter: Payer: Self-pay | Admitting: Endocrinology

## 2023-12-27 ENCOUNTER — Ambulatory Visit (INDEPENDENT_AMBULATORY_CARE_PROVIDER_SITE_OTHER): Admitting: Endocrinology

## 2023-12-27 VITALS — BP 138/80 | HR 99 | Resp 20 | Ht 78.0 in | Wt 291.4 lb

## 2023-12-27 DIAGNOSIS — M79671 Pain in right foot: Secondary | ICD-10-CM | POA: Diagnosis not present

## 2023-12-27 DIAGNOSIS — M216X1 Other acquired deformities of right foot: Secondary | ICD-10-CM | POA: Diagnosis not present

## 2023-12-27 DIAGNOSIS — Z794 Long term (current) use of insulin: Secondary | ICD-10-CM

## 2023-12-27 DIAGNOSIS — E1165 Type 2 diabetes mellitus with hyperglycemia: Secondary | ICD-10-CM

## 2023-12-27 LAB — POCT GLYCOSYLATED HEMOGLOBIN (HGB A1C): Hemoglobin A1C: 10.2 % — AB (ref 4.0–5.6)

## 2023-12-27 MED ORDER — INSULIN ASP PROT & ASP FLEXPEN (70-30) 100 UNIT/ML ~~LOC~~ SUPN: PEN_INJECTOR | SUBCUTANEOUS | 3 refills | Status: AC

## 2023-12-27 MED ORDER — SEMAGLUTIDE (1 MG/DOSE) 4 MG/3ML ~~LOC~~ SOPN: 1.0000 mg | PEN_INJECTOR | SUBCUTANEOUS | 3 refills | Status: AC

## 2023-12-27 NOTE — Progress Notes (Signed)
 Outpatient Endocrinology Note [name], MD   Patient's Name: Lawrence Fisher    DOB: September 19, 1970    MRN: [id number]                                                    REASON OF VISIT: Follow up for type 2 diabetes mellitus  PCP: Mayer Speaker, PA  HISTORY OF PRESENT ILLNESS:   Lawrence Fisher is a 53 y.o. old male with past medical history listed below, is here for follow up for type 2 diabetes mellitus.   Pertinent Diabetes History: Patient was previously seen by Dr. Hubert Madden and was last time seen in January 2024.  Patient was diagnosed with type 2 diabetes mellitus in 2015.  At the time of diagnosis he had symptoms of increased thirst, weakness and found to have hyperglycemia on ER visit.  He reestablish follow-up visit in January 2024.  Chronic Diabetes Complications : Retinopathy: unknown. Last ophthalmology exam was done on Due, following with ophthalmology regularly.  Nephropathy: Microalbuminuria present, on ACE/ARB / lisinopril  Peripheral neuropathy: yes. Coronary artery disease: yes Stroke: no  Relevant comorbidities and cardiovascular risk factors: Obesity: yes Body mass index is 33.67 kg/m.  Hypertension: Yes  Hyperlipidemia : Yes, on statin   Current / Home Diabetic regimen includes:  NovoLog  mix 35 units with breakfast and 45 units with supper. Farxiga  10 mg daily. Ozempic  0.5 mg weekly. Glimepiride  4 mg daily..   Prior diabetic medications: Metformin  in the past stopped due to diarrhea.   He used to be on Bydureon  as well.Ozempic  2 mg weekly, stopped by patient due to rapid weight loss and significant appetite suppression, about 1 year ago.  Glycemic data:    CONTINUOUS GLUCOSE MONITORING SYSTEM (CGMS) INTERPRETATION:                         Dexcom G7 CGM-  Sensor Download (Sensor download was reviewed and summarized below.) Dates: April 20 - Dec 23, 2023  Glucose Management Indicator: 7.8% Sensor usage:99 %    Impression: - Frequent mild  hyperglycemia postprandially related with meals usually 200-250 range and sometimes up to 350 range especially with supper.  Especially in the afternoon blood sugar in between the meals are acceptable.  Significant improvement on diabetes control with GMI 7.8%.  No hypoglycemia.  Hypoglycemia: Patient has  no hypoglycemic episodes. Patient has hypoglycemia awareness.  Factors modifying glucose control: 1.  Diabetic diet assessment: 3 -4 meals a day.  2.  Staying active or exercising: No formal exercise.  3.  Medication compliance: compliant some of the time.  Interval history  CGM data as reviewed above, significant improvement on blood sugar.  GMI on CGM 728%.  Hemoglobin A1c 10.2% improved from more than 15%.  He still complains of numbness of the toes, occasional dizziness.  Diabetes regimen is reviewed and noted above.  He has been tolerating Ozempic  well, denies any GI issues.  No other complaints today.  REVIEW OF SYSTEMS As per history of present illness.   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) 10/26/2023   Diabetes mellitus without complication (HCC)    Diabetic neuropathy with neurologic complication (HCC) 10/18/2018   Erectile dysfunction due to diabetes mellitus (HCC) 06/02/2021   Generalized abdominal pain 08/01/2016   Heel cord tightness, right 03/27/2023   History of diabetic ulcer of foot 01/09/2023   Hypertension    Hypertension associated with  type 2 diabetes mellitus (HCC) 08/01/2016   Incontinence of feces 08/01/2016   Microalbuminuria due to type 2 diabetes mellitus (HCC) 07/10/2018   Mixed hyperlipidemia 07/10/2018   Moderate episode of recurrent major depressive disorder (HCC) 08/01/2016   Neuropathy due to type 2 diabetes mellitus (HCC) 11/18/2022   Plantar callus 12/19/2022   Right shoulder pain 09/25/2018   Added automatically from request for surgery [id number]     Smoker 08/01/2016   Ulcer of right foot, limited to breakdown of skin (HCC) 11/18/2022   Uncontrolled type 2 diabetes mellitus with hyperglycemia, without long-term current use of insulin  (HCC) 12/29/2015   Unspecified dislocation of right shoulder joint, initial encounter 05/21/2018    MRI right shoulder-05/29/2018- changes consistent with history of anterior dislocation with subacute Hill-Sachs impaction fracture of the proximal humerus, displaced glenoid fracture and Bankart lesion with associated intra-articular loose bodies and extensive labral tearing, rotator cuff tendinopathy without tear, 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     Vitamin D deficiency 10/20/2021    PAST SURGICAL HISTORY: Past Surgical History:  Procedure Laterality Date   ANKLE SURGERY     CHOLECYSTECTOMY     CORONARY PRESSURE/FFR WITH 3D MAPPING N/A 11/02/2023   Procedure: Coronary Pressure/FFR w/3D Mapping;  Surgeon: Kyra Phy, MD;  Location: [facility];  Service: Cardiovascular;  Laterality: N/A;   LEFT HEART CATH AND CORONARY ANGIOGRAPHY N/A 11/02/2023   Procedure: LEFT HEART CATH AND CORONARY ANGIOGRAPHY;  Surgeon: Kyra Phy, MD;  Location: [facility];  Service: Cardiovascular;  Laterality: N/A;   OPEN REDUCTION INTERNAL FIXATION (ORIF) METACARPAL Right 06/02/2016   Procedure: OPEN REDUCTION INTERNAL FIXATION (ORIF) right ring phalanx and  METACARPAL fractures;  Surgeon: Brunilda Capra, MD;  Location: [facility];  Service: Orthopedics;   Laterality: Right;  OPEN REDUCTION INTERNAL FIXATION (ORIF) right ring phalanx and  METACARPAL fractures   TONSILLECTOMY      ALLERGIES: Allergies  Allergen Reactions   Metformin  Diarrhea    FAMILY HISTORY:  Family History  Problem Relation Age of Onset   Heart disease Mother    Hypertension Mother    Diabetes Mother    Diabetes Father    Heart disease Father    Hypertension Father    Cancer Neg Hx     SOCIAL HISTORY: Social History   Socioeconomic History   Marital status: Married    Spouse name: Not on file   Number of children: Not on file   Years of education: Not on file   Highest education level: Not on file  Occupational History   Not on file  Tobacco Use   Smoking status: Every Day    Current packs/day: 0.50    Types: Cigarettes   Smokeless tobacco: Never  Vaping Use   Vaping status: Never Used  Substance and Sexual Activity   Alcohol use:  Yes    Alcohol/week: 0.0 standard drinks of alcohol    Comment: occ   Drug use: Yes    Types: Marijuana    Comment: denies   Sexual activity: Not on file  Other Topics Concern   Not on file  Social History Narrative   ** Merged History Encounter **       Social Drivers of Health   Financial Resource Strain: Low Risk  (10/03/2023)   Received from [facility], [facility]   Overall Financial Resource Strain (CARDIA)    Difficulty of Paying Living Expenses: Not hard at all  Food Insecurity: Low Risk  (12/24/2023)   Received from [facility]   Hunger Vital Sign    Worried About Running Out of Food in the Last Year: Never true    Ran Out of Food in the Last Year: Patient declined to answer  Transportation Needs: No Transportation Needs (12/24/2023)   Received from [facility]    In the past 12 months, has lack of reliable transportation kept you from medical appointments, meetings, work or from getting things needed for daily living? : No  Physical Activity: Not on file  Stress: Not on  file  Social Connections: Unknown (01/03/2022)   Received from [facility], [facility]   Social Network    Social Network: Not on file    MEDICATIONS:  Current Outpatient Medications  Medication Sig Dispense Refill   amLODipine (NORVASC) 10 MG tablet Take 10 mg by mouth daily.     aspirin  EC 81 MG tablet Take 1 tablet (81 mg total) by mouth daily. Swallow whole. 90 tablet 3   atorvastatin (LIPITOR) 80 MG tablet Take 80 mg by mouth daily.     BAYER MICROLET LANCETS lancets Use as instructed to check blood sugar 3 times per day dx code E11.65 100 each 3   Continuous Blood Gluc Sensor (FREESTYLE LIBRE 2 SENSOR) MISC USE EVERY 14 DAYS 2 each 3   Continuous Blood Gluc Transmit (DEXCOM G6 TRANSMITTER) MISC Use one every 90 days 1 each 3   Continuous Glucose Sensor (DEXCOM G7 SENSOR) MISC 1 Device by Does not apply route continuous. 9 each 3   dapagliflozin  propanediol (FARXIGA ) 10 MG TABS tablet Take 1 tablet (10 mg total) by mouth daily before breakfast. 90 tablet 3   glimepiride  (AMARYL ) 4 MG tablet Take 1 tablet (4 mg total) by mouth daily before breakfast. 90 tablet 3   glucose blood (BAYER CONTOUR NEXT TEST) test strip Use as instructed to check blood sugar 3 times per day Dx code E11.65 100 each 3   Insulin  Pen Needle 32G X 4 MM MISC Use to inject insulin  2 times a day. 200 each 3   isosorbide  mononitrate (IMDUR ) 30 MG 24 hr tablet Take 1 tablet (30 mg total) by mouth daily. 30 tablet 2   lisinopril  (ZESTRIL ) 40 MG tablet Take 40 mg by mouth daily.     Multiple Vitamins-Minerals (ADULT GUMMY PO) Take 1 capsule by mouth daily.     nitroGLYCERIN  (NITROSTAT ) 0.4 MG SL tablet Place 0.4 mg under the tongue every 5 (five) minutes as needed for chest pain.     Semaglutide , 1 MG/DOSE, 4 MG/3ML SOPN Inject 1 mg as directed once a week. 9 mL 3   sertraline (ZOLOFT) 100 MG tablet Take 150 mg by mouth daily.     Vitamin D, Ergocalciferol, (DRISDOL) 1.25 MG (50000 UNIT) CAPS capsule Take 50,000  Units by mouth every Tuesday.  Insulin  Asp Prot & Asp FlexPen (NOVOLOG  70/30 MIX) (70-30) 100 UNIT/ML FlexPen INJECT 30 UNITS BEFORE BREAKFAST AND 30 UNITS BEFORE SUPPER 30 mL 3   No current facility-administered medications for this visit.    PHYSICAL EXAM: Vitals:   12/27/23 0809  BP: 138/80  Pulse: 99  Resp: 20  SpO2: 96%  Weight: 291 lb 6.4 oz (132.2 kg)  Height: 6\' 6"  (1.981 m)     Body mass index is 33.67 kg/m.  Wt Readings from Last 3 Encounters:  12/27/23 291 lb 6.4 oz (132.2 kg)  11/30/23 280 lb 1.3 oz (127 kg)  11/08/23 274 lb 3.2 oz (124.4 kg)    General: Well developed, well nourished male in no apparent distress.  HEENT: AT/[place], no external lesions.  Eyes: Conjunctiva clear and no icterus. Neck: Neck supple  Lungs: Respirations not labored Neurologic: Alert, oriented, normal speech Extremities / Skin: Dry.  Psychiatric: Does not appear depressed or anxious  Diabetic Foot Exam - Simple   No data filed      LABS Reviewed Lab Results  Component Value Date   HGBA1C 10.2 (A) 12/27/2023   HGBA1C >15.0 09/05/2023   HGBA1C 10.5 (H) 08/29/2022   Lab Results  Component Value Date   FRUCTOSAMINE 333 (H) 09/06/2021   FRUCTOSAMINE 389 (H) 12/15/2020   FRUCTOSAMINE 253 01/19/2017   Lab Results  Component Value Date   CHOL 152 11/17/2021   HDL 43.00 11/17/2021   LDLCALC 77 11/17/2021   TRIG 160.0 (H) 11/17/2021   CHOLHDL 4 11/17/2021   Lab Results  Component Value Date   MICRALBCREAT 59.7 (H) 08/29/2022   MICRALBCREAT 30.5 (H) 12/15/2020   Lab Results  Component Value Date   CREATININE 1.06 10/26/2023   Lab Results  Component Value Date   GFR 71.94 08/29/2022    ASSESSMENT / PLAN  1. Uncontrolled type 2 diabetes mellitus with hyperglycemia, with long-term current use of insulin  (HCC)     Diabetes Mellitus type 2, complicated by diabetic neuropathy and microalbuminuria. - Diabetic status / severity: Uncontrolled, improving.  Lab  Results  Component Value Date   HGBA1C 10.2 (A) 12/27/2023    - Hemoglobin A1c goal : <6.5%  Congratulated him for the improvement of diabetes control.  GMI on CGM 7.8%.  Discussed about limiting carbohydrate and portion control.  Adjusted diabetes regimen as follows.  - Medications: See below. Decrease NovoLog  mix 35 units to 30 units with breakfast and 45 units to 30 units with supper. Continue Farxiga  10 mg daily. Increase Ozempic  from 0.5 to 1 mg weekly.   Continue glimepiride  4 mg daily.  - Home glucose testing: Stay on CGM Dexcom G7 or check blood sugar before meals and at bedtime.    - Discussed/ Gave Hypoglycemia treatment plan.  # Consult : Not required at this time. # Annual urine for microalbuminuria/ creatinine ratio, + microalbuminuria currently, continue ACE/ARB /lisinopril .  Also on Farxiga .  Will check urine microalbumin creatinine ratio in next follow-up visit.   Last  Lab Results  Component Value Date   MICRALBCREAT 59.7 (H) 08/29/2022    # Foot check nightly / neuropathy, continue podiatry.  # Annual dilated diabetic eye exams.  Advised for diabetic eye exam.  - Diet: Make healthy diabetic food choices - Life style / activity / exercise: Discussed.  2. Blood pressure  -  BP Readings from Last 1 Encounters:  12/27/23 138/80    - Control is in target.  - No change in current plans.  3.  Lipid status / Hyperlipidemia - Last  Lab Results  Component Value Date   LDLCALC 77 11/17/2021   -Previously on atorvastatin, managed by primary care provider.   Diagnoses and all orders for this visit:  Uncontrolled type 2 diabetes mellitus with hyperglycemia, with long-term current use of insulin  (HCC) -     POCT glycosylated hemoglobin (Hb A1C) -     Semaglutide , 1 MG/DOSE, 4 MG/3ML SOPN; Inject 1 mg as directed once a week. -     Insulin  Asp Prot & Asp FlexPen (NOVOLOG  70/30 MIX) (70-30) 100 UNIT/ML FlexPen; INJECT 30 UNITS BEFORE BREAKFAST AND 30 UNITS  BEFORE SUPPER    DISPOSITION Follow up in clinic in 3 months suggested.   All questions answered and patient verbalized understanding of the plan.  [place] Talya Quain, MD [facility] Endocrinology [facility] [address] [place], [address] [place], [place] [address] Phone # [phone]  At least part of this note was generated using voice recognition software. Inadvertent word errors may have occurred, which were not recognized during the proofreading process.

## 2023-12-27 NOTE — Patient Instructions (Signed)
 NovoLog  mix 30 units with breakfast and 30 units with supper. Farxiga  10 mg daily.   Increase Ozempic  1 mg weekly. Glimepiride  4 mg daily.

## 2024-01-24 DIAGNOSIS — M216X1 Other acquired deformities of right foot: Secondary | ICD-10-CM | POA: Diagnosis not present

## 2024-01-24 DIAGNOSIS — M79671 Pain in right foot: Secondary | ICD-10-CM | POA: Diagnosis not present

## 2024-01-24 DIAGNOSIS — E1165 Type 2 diabetes mellitus with hyperglycemia: Secondary | ICD-10-CM | POA: Diagnosis not present

## 2024-01-24 DIAGNOSIS — L84 Corns and callosities: Secondary | ICD-10-CM | POA: Diagnosis not present

## 2024-02-05 ENCOUNTER — Other Ambulatory Visit (HOSPITAL_COMMUNITY): Payer: Self-pay | Admitting: Cardiology

## 2024-02-05 ENCOUNTER — Other Ambulatory Visit: Payer: Self-pay | Admitting: Cardiology

## 2024-04-10 ENCOUNTER — Ambulatory Visit: Admitting: Endocrinology

## 2024-06-20 ENCOUNTER — Ambulatory Visit: Admitting: Endocrinology

## 2024-06-24 ENCOUNTER — Ambulatory Visit: Admitting: Endocrinology

## 2024-06-24 ENCOUNTER — Encounter: Payer: Self-pay | Admitting: Endocrinology

## 2024-06-24 ENCOUNTER — Ambulatory Visit: Payer: Self-pay | Admitting: Endocrinology

## 2024-06-24 VITALS — BP 148/90 | HR 98 | Ht 78.0 in | Wt 290.0 lb

## 2024-06-24 DIAGNOSIS — Z794 Long term (current) use of insulin: Secondary | ICD-10-CM

## 2024-06-24 DIAGNOSIS — E1165 Type 2 diabetes mellitus with hyperglycemia: Secondary | ICD-10-CM

## 2024-06-24 LAB — POCT GLYCOSYLATED HEMOGLOBIN (HGB A1C): Hemoglobin A1C: 10.4 % — AB (ref 4.0–5.6)

## 2024-06-24 MED ORDER — GLIMEPIRIDE 4 MG PO TABS: 4.0000 mg | ORAL_TABLET | Freq: Every day | ORAL | 3 refills | Status: AC

## 2024-06-24 MED ORDER — DEXCOM G7 SENSOR MISC: 1.0000 | 3 refills | Status: AC

## 2024-06-24 NOTE — Patient Instructions (Signed)
 NovoLog  mix 30 units with breakfast and 30 units with supper. Farxiga  10 mg daily.   Ozempic  1 mg weekly. Glimepiride  4 mg daily.

## 2024-06-24 NOTE — Progress Notes (Signed)
 Outpatient Endocrinology Note Loveda Colaizzi, MD   Patient's Name: Lawrence Fisher    DOB: May 17, 1971    MRN: [id number]                                                    REASON OF VISIT: Follow up for type 2 diabetes mellitus  PCP: Neysa Tinnie BRAVO, PA  HISTORY OF PRESENT ILLNESS:   Lawrence Fisher is a 53 y.o. old male with past medical history listed below, is here for follow up for type 2 diabetes mellitus.   Pertinent Diabetes History: Patient was previously seen by Dr. Von and was last time seen in January 2024.  Patient was diagnosed with type 2 diabetes mellitus in 2015.  At the time of diagnosis he had symptoms of increased thirst, weakness and found to have hyperglycemia on ER visit.  He reestablish follow-up visit in January 2024.  Chronic Diabetes Complications : Retinopathy: unknown. Last ophthalmology exam was done on Due, following with ophthalmology regularly.  Nephropathy: Microalbuminuria present, on ACE/ARB / lisinopril  Peripheral neuropathy: yes. Coronary artery disease: yes Stroke: no  Relevant comorbidities and cardiovascular risk factors: Obesity: yes Body mass index is 33.51 kg/m.  Hypertension: Yes  Hyperlipidemia : Yes, on statin   Current / Home Diabetic regimen includes:  NovoLog  mix 30 units with breakfast and 30 units with supper. Taking once a day only.  Farxiga  10 mg daily. Ozempic  1 mg weekly. Glimepiride  4 mg daily..   Prior diabetic medications: Metformin  in the past stopped due to diarrhea.   He used to be on Bydureon  as well.Ozempic  2 mg weekly, stopped by patient due to rapid weight loss and significant appetite suppression, about 1 year ago.  Glycemic data:    Had to use Dexcom CGM in the past, has not been using for last 1 month, not glucose data to review.  Hypoglycemia: Patient has  no hypoglycemic episodes. Patient has hypoglycemia awareness.  Factors modifying glucose control: 1.  Diabetic diet assessment: 3 -4 meals a  day.  2.  Staying active or exercising: No formal exercise.  3.  Medication compliance: compliant some of the time.  Interval history  No glucose data to review.  He ran out of the Dexcom, reports last resolved did not have insurance now he has Medicaid.  Diabetes is been as reviewed and noted above.  He reports he has been taking insulin  once a day in the morning only.  Hemoglobin A1c 10.4%.  He has complaints of leg muscle ache and numbness and tingling.  Occasional dizziness.  He has been getting occasional nausea, currently taking Ozempic  1 mg weekly, denies vomiting.  No other complaints today.  REVIEW OF SYSTEMS As per history of present illness.   PAST MEDICAL HISTORY: Past Medical History:  Diagnosis Date   Acquired bilateral hammer toes 11/18/2022   Angina pectoris 08/02/2023   Chronic diarrhea 08/01/2016   Chronic fatigue 10/11/2021   Class 1 obesity with serious comorbidity and body mass index (BMI) of 33.0 to 33.9 in adult 10/18/2018   Closed displaced fracture of neck of fourth metacarpal bone of right hand with routine healing 06/22/2016   Closed displaced fracture of proximal phalanx of right ring finger with routine healing 06/22/2016   Coronary artery disease with angina pectoris 10/26/2023   Diabetes mellitus without complication (  HCC)    Diabetic neuropathy with neurologic complication (HCC) 10/18/2018   Erectile dysfunction due to diabetes mellitus (HCC) 06/02/2021   Generalized abdominal pain 08/01/2016   Heel cord tightness, right 03/27/2023   History of diabetic ulcer of foot 01/09/2023   Hypertension    Hypertension associated with type 2 diabetes mellitus (HCC) 08/01/2016   Incontinence of feces 08/01/2016   Microalbuminuria due to type 2 diabetes mellitus (HCC) 07/10/2018   Mixed hyperlipidemia 07/10/2018   Moderate episode of recurrent major depressive disorder (HCC) 08/01/2016   Neuropathy due to type 2 diabetes mellitus (HCC) 11/18/2022   Plantar  callus 12/19/2022   Right shoulder pain 09/25/2018   Added automatically from request for surgery [id number]     Smoker 08/01/2016   Ulcer of right foot, limited to breakdown of skin (HCC) 11/18/2022   Uncontrolled type 2 diabetes mellitus with hyperglycemia, without long-term current use of insulin  (HCC) 12/29/2015   Unspecified dislocation of right shoulder joint, initial encounter 05/21/2018    MRI right shoulder-05/29/2018- changes consistent with history of anterior dislocation with subacute Hill-Sachs impaction fracture of the proximal humerus, displaced glenoid fracture and Bankart lesion with associated intra-articular loose bodies and extensive labral tearing, rotator cuff tendinopathy without tear, 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     Vitamin D deficiency 10/20/2021    PAST SURGICAL HISTORY: Past Surgical History:  Procedure Laterality Date   ANKLE SURGERY     CHOLECYSTECTOMY     CORONARY PRESSURE/FFR WITH 3D MAPPING N/A 11/02/2023   Procedure: Coronary Pressure/FFR w/3D Mapping;  Surgeon: Wendel Lurena POUR, MD;  Location: [facility];  Service: Cardiovascular;  Laterality: N/A;   LEFT HEART CATH AND CORONARY ANGIOGRAPHY N/A 11/02/2023   Procedure: LEFT HEART CATH AND CORONARY ANGIOGRAPHY;  Surgeon: Wendel Lurena POUR, MD;  Location: [facility];  Service: Cardiovascular;  Laterality: N/A;   OPEN REDUCTION INTERNAL FIXATION (ORIF) METACARPAL Right 06/02/2016   Procedure: OPEN REDUCTION INTERNAL FIXATION (ORIF) right ring phalanx and  METACARPAL fractures;  Surgeon: Franky Curia, MD;  Location: [facility];  Service: Orthopedics;  Laterality: Right;  OPEN REDUCTION INTERNAL FIXATION (ORIF) right ring phalanx and  METACARPAL fractures   TONSILLECTOMY      ALLERGIES: Allergies  Allergen Reactions   Metformin  Diarrhea    FAMILY HISTORY:  Family History  Problem Relation Age of Onset   Heart disease Mother    Hypertension Mother    Diabetes Mother     Diabetes Father    Heart disease Father    Hypertension Father    Cancer Neg Hx     SOCIAL HISTORY: Social History   Socioeconomic History   Marital status: Married    Spouse name: Not on file   Number of children: Not on file   Years of education: Not on file   Highest education level: Not on file  Occupational History   Not on file  Tobacco Use   Smoking status: Every Day    Current packs/day: 0.50    Types: Cigarettes   Smokeless tobacco: Never  Vaping Use   Vaping status: Never Used  Substance and Sexual Activity   Alcohol use: Yes    Alcohol/week: 0.0 standard drinks of alcohol    Comment: occ   Drug use: Yes    Types: Marijuana    Comment: denies   Sexual activity: Not on file  Other Topics Concern   Not on file  Social History Narrative   ** Merged History  Encounter **       Social Drivers of Health   Financial Resource Strain: Low Risk  (10/03/2023)   [facility]   Overall Financial Resource Strain (CARDIA)    Difficulty of Paying Living Expenses: Not hard at all  Food Insecurity: Low Risk  (12/24/2023)   Received from [facility]   Hunger Vital Sign    Within the past 12 months, you worried that your food would run out before you got money to buy more: Never true    Within the past 12 months, the food you bought just didn't last and you didn't have money to get more. : Patient declined to answer  Transportation Needs: No Transportation Needs (12/24/2023)   Received from [facility]    In the past 12 months, has lack of reliable transportation kept you from medical appointments, meetings, work or from getting things needed for daily living? : No  Physical Activity: Not on file  Stress: Not on file  Social Connections: Unknown (01/03/2022)   Received from [facility]   Social Network    Social Network: Not on file    MEDICATIONS:  Current Outpatient Medications  Medication Sig Dispense Refill   amLODipine  (NORVASC) 10 MG tablet Take 10 mg by mouth daily.     aspirin  EC 81 MG tablet Take 1 tablet (81 mg total) by mouth daily. Swallow whole. 90 tablet 3   atorvastatin (LIPITOR) 80 MG tablet Take 80 mg by mouth daily.     BAYER MICROLET LANCETS lancets Use as instructed to check blood sugar 3 times per day dx code E11.65 100 each 3   Continuous Blood Gluc Transmit (DEXCOM G6 TRANSMITTER) MISC Use one every 90 days 1 each 3   dapagliflozin  propanediol (FARXIGA ) 10 MG TABS tablet Take 1 tablet (10 mg total) by mouth daily before breakfast. 90 tablet 3   glucose blood (BAYER CONTOUR NEXT TEST) test strip Use as instructed to check blood sugar 3 times per day Dx code E11.65 100 each 3   Insulin  Asp Prot & Asp FlexPen (NOVOLOG  70/30 MIX) (70-30) 100 UNIT/ML FlexPen INJECT 30 UNITS BEFORE BREAKFAST AND 30 UNITS BEFORE SUPPER 30 mL 3   Insulin  Pen Needle 32G X 4 MM MISC Use to inject insulin  2 times a day. 200 each 3   isosorbide  mononitrate (IMDUR ) 30 MG 24 hr tablet TAKE 1 TABLET BY MOUTH DAILY 90 tablet 1   Multiple Vitamins-Minerals (ADULT GUMMY PO) Take 1 capsule by mouth daily.     nitroGLYCERIN  (NITROSTAT ) 0.4 MG SL tablet Place 0.4 mg under the tongue every 5 (five) minutes as needed for chest pain.     Semaglutide , 1 MG/DOSE, 4 MG/3ML SOPN Inject 1 mg as directed once a week. 9 mL 3   sertraline (ZOLOFT) 100 MG tablet Take 150 mg by mouth daily.     Vitamin D, Ergocalciferol, (DRISDOL) 1.25 MG (50000 UNIT) CAPS capsule Take 50,000 Units by mouth every Tuesday.     Continuous Glucose Sensor (DEXCOM G7 SENSOR) MISC 1 Device by Does not apply route continuous. 9 each 3   glimepiride  (AMARYL ) 4 MG tablet Take 1 tablet (4 mg total) by mouth daily before breakfast. 90 tablet 3   No current facility-administered medications for this visit.    PHYSICAL EXAM: Vitals:   06/24/24 0912  BP: (!) 148/90  Pulse: 98  SpO2: 98%  Weight: 290 lb (131.5 kg)  Height: 6' 6 (1.981 m)  Body mass index is  33.51 kg/m.  Wt Readings from Last 3 Encounters:  06/24/24 290 lb (131.5 kg)  12/27/23 291 lb 6.4 oz (132.2 kg)  11/30/23 280 lb 1.3 oz (127 kg)    General: Well developed, well nourished male in no apparent distress.  HEENT: AT/[place], no external lesions.  Eyes: Conjunctiva clear and no icterus. Neck: Neck supple  Lungs: Respirations not labored Neurologic: Alert, oriented, normal speech Extremities / Skin: Dry.  Psychiatric: Does not appear depressed or anxious  Diabetic Foot Exam - Simple   No data filed      LABS Reviewed Lab Results  Component Value Date   HGBA1C 10.4 (A) 06/24/2024   HGBA1C 10.2 (A) 12/27/2023   HGBA1C >15.0 09/05/2023   Lab Results  Component Value Date   FRUCTOSAMINE 333 (H) 09/06/2021   FRUCTOSAMINE 389 (H) 12/15/2020   FRUCTOSAMINE 253 01/19/2017   Lab Results  Component Value Date   CHOL 152 11/17/2021   HDL 43.00 11/17/2021   LDLCALC 77 11/17/2021   TRIG 160.0 (H) 11/17/2021   CHOLHDL 4 11/17/2021   No results found for: [facility]  Lab Results  Component Value Date   CREATININE 1.06 10/26/2023   Lab Results  Component Value Date   GFR 71.94 08/29/2022    ASSESSMENT / PLAN  1. Uncontrolled type 2 diabetes mellitus with hyperglycemia, with long-term current use of insulin  (HCC)     Diabetes Mellitus type 2, complicated by diabetic neuropathy and microalbuminuria. - Diabetic status / severity: Uncontrolled, improving.  Lab Results  Component Value Date   HGBA1C 10.4 (A) 06/24/2024    - Hemoglobin A1c goal : <6.5%  No glucose data to review.  Hemoglobin A1c still high at 10.4%.  He reports he has only been taking insulin  once a day in the morning.  He has also been snacking at bedtime.  Advised patient to limit or avoid bedtime snacks.  Discussed about importance of diabetes control.  - Medications: See below. Take NovoLog  mix 30 units with breakfast and 30 units with supper. Continue Farxiga  10 mg daily. Continue  Ozempic  1 mg weekly.   Continue glimepiride  4 mg daily.  - Home glucose testing: Stay on CGM Dexcom G7 or check blood sugar before meals and at bedtime.   Sent prescription for Dexcom G7.  - Discussed/ Gave Hypoglycemia treatment plan.  # Consult : Not required at this time. # Annual urine for microalbuminuria/ creatinine ratio, + microalbuminuria currently, continue ACE/ARB /lisinopril .  Also on Farxiga .  Will check urine microalbumin creatinine ratio in next follow-up visit.   Last  No results found for: MICRALBCREAT   # Foot check nightly / neuropathy, continue podiatry.  # Annual dilated diabetic eye exams.  Advised for diabetic eye exam.  - Diet: Make healthy diabetic food choices - Life style / activity / exercise: Discussed.  2. Blood pressure  -  BP Readings from Last 1 Encounters:  06/24/24 (!) 148/90    - Control is in target.  - No change in current plans.  3. Lipid status / Hyperlipidemia - Last  Lab Results  Component Value Date   LDLCALC 77 11/17/2021   -Previously on atorvastatin, managed by primary care provider.   Lawrence Fisher was seen today for diabetes.  Diagnoses and all orders for this visit:  Uncontrolled type 2 diabetes mellitus with hyperglycemia, with long-term current use of insulin  (HCC) -     POCT glycosylated hemoglobin (Hb A1C) -     Continuous Glucose Sensor (  DEXCOM G7 SENSOR) MISC; 1 Device by Does not apply route continuous. -     glimepiride  (AMARYL ) 4 MG tablet; Take 1 tablet (4 mg total) by mouth daily before breakfast.    DISPOSITION Follow up in clinic in 3 months suggested.   All questions answered and patient verbalized understanding of the plan.  Lawrence Sokolowski, MD [facility] Endocrinology [facility] [address] [place], [address] [place], [place] [address] Phone # [phone]  At least part of this note was generated using voice recognition software. Inadvertent word errors may have occurred, which were not recognized  during the proofreading process.

## 2024-06-27 ENCOUNTER — Telehealth: Payer: Self-pay | Admitting: Pharmacy Technician

## 2024-06-27 ENCOUNTER — Other Ambulatory Visit (HOSPITAL_COMMUNITY): Payer: Self-pay

## 2024-06-27 NOTE — Telephone Encounter (Signed)
 Pharmacy Patient Advocate Encounter   Received notification from Onbase that prior authorization for Dexcom G7 Sensor is required/requested.   Insurance verification completed.   The patient is insured through [employer].   Per test claim: PA required and submitted KEY/EOC/Request #: [id number] from 06/27/24 to 12/24/24. Ran test claim, Copay is $0.00. This test claim was processed through [facility]- copay amounts may vary at other pharmacies due to pharmacy/plan contracts, or as the patient moves through the different stages of their insurance plan.

## 2024-07-04 ENCOUNTER — Telehealth: Payer: Self-pay | Admitting: Pharmacy Technician

## 2024-07-04 ENCOUNTER — Other Ambulatory Visit (HOSPITAL_COMMUNITY): Payer: Self-pay

## 2024-07-04 NOTE — Telephone Encounter (Signed)
 Pharmacy Patient Advocate Encounter   Received notification from CoverMyMeds that prior authorization for Ozempic  (1 MG/DOSE) 4MG /3ML pen-injectors  is required/requested.   Insurance verification completed.   The patient is insured through [employer].   Per test claim: PA required and submitted KEY/EOC/Request #: [id number] from 07/04/24 to 07/04/25. Ran test claim, Copay is $4.00. This test claim was processed through [facility]- copay amounts may vary at other pharmacies due to pharmacy/plan contracts, or as the patient moves through the different stages of their insurance plan.

## 2024-08-13 ENCOUNTER — Telehealth: Payer: Self-pay | Admitting: Endocrinology

## 2024-08-13 ENCOUNTER — Telehealth: Payer: Self-pay

## 2024-08-13 ENCOUNTER — Other Ambulatory Visit (HOSPITAL_COMMUNITY): Payer: Self-pay

## 2024-08-13 NOTE — Telephone Encounter (Signed)
 Pharmacy Patient Advocate Encounter   Received notification from Pt Calls Messages that prior authorization for Farxiga  10mg  is required/requested.   Insurance verification completed.   The patient is insured through [employer].   Per test claim: PA required; PA submitted to above mentioned insurance via Latent Key/confirmation #/EOC [facility] Status is pending   PA has been APPROVED from 08/13/24 to 08/13/2025 Case ID# [id number]

## 2024-08-13 NOTE — Telephone Encounter (Signed)
 Spouse called stating patient out of Farxiga  and needs a PA.Routed to PA team.

## 2024-08-13 NOTE — Telephone Encounter (Signed)
 Patient's spouse, Markon Jares, is calling to say that per pharmacy, [facility] [id number] - [place], [place] - [address] (Ph: [phone]) the prescription for dapagliflozin  propanediol (FARXIGA ) 10 MG TABS tablet is requiring a prior authorization.  Rexene states that Jenesis has been out of the medication for 3-4 days and his blood sugar levels are running around 250 since he has been out.

## 2024-09-05 ENCOUNTER — Encounter: Payer: Self-pay | Admitting: Endocrinology

## 2024-09-05 ENCOUNTER — Encounter: Payer: Self-pay | Admitting: Cardiology

## 2024-09-09 ENCOUNTER — Other Ambulatory Visit: Payer: Self-pay

## 2024-09-17 ENCOUNTER — Encounter: Payer: Self-pay | Admitting: Cardiology

## 2024-09-17 ENCOUNTER — Ambulatory Visit: Attending: Cardiology | Admitting: Cardiology

## 2024-09-17 VITALS — BP 138/88 | HR 103 | Ht 78.5 in | Wt 289.0 lb

## 2024-09-17 DIAGNOSIS — E1159 Type 2 diabetes mellitus with other circulatory complications: Secondary | ICD-10-CM | POA: Diagnosis not present

## 2024-09-17 DIAGNOSIS — I152 Hypertension secondary to endocrine disorders: Secondary | ICD-10-CM | POA: Insufficient documentation

## 2024-09-17 DIAGNOSIS — E66811 Obesity, class 1: Secondary | ICD-10-CM | POA: Insufficient documentation

## 2024-09-17 DIAGNOSIS — R079 Chest pain, unspecified: Secondary | ICD-10-CM | POA: Diagnosis not present

## 2024-09-17 DIAGNOSIS — F17200 Nicotine dependence, unspecified, uncomplicated: Secondary | ICD-10-CM | POA: Diagnosis not present

## 2024-09-17 DIAGNOSIS — I25119 Atherosclerotic heart disease of native coronary artery with unspecified angina pectoris: Secondary | ICD-10-CM | POA: Diagnosis not present

## 2024-09-17 NOTE — Patient Instructions (Signed)
 Medication Instructions:  Your physician recommends that you continue on your current medications as directed. Please refer to the Current Medication list given to you today.  *If you need a refill on your cardiac medications before your next appointment, please call your pharmacy*  Lab Work: None If you have labs (blood work) drawn today and your tests are completely normal, you will receive your results only by: MyChart Message (if you have MyChart) OR A paper copy in the mail If you have any lab test that is abnormal or we need to change your treatment, we will call you to review the results.  Testing/Procedures: None  Follow-Up: At [facility], you and your health needs are our priority.  As part of our continuing mission to provide you with exceptional heart care, our providers are all part of one team.  This team includes your primary Cardiologist (physician) and Advanced Practice Providers or APPs (Physician Assistants and Nurse Practitioners) who all work together to provide you with the care you need, when you need it.  Your next appointment:   9 month(s)  Provider:   Belva Crome, MD    We recommend signing up for the patient portal called "MyChart".  Sign up information is provided on this After Visit Summary.  MyChart is used to connect with patients for Virtual Visits (Telemedicine).  Patients are able to view lab/test results, encounter notes, upcoming appointments, etc.  Non-urgent messages can be sent to your provider as well.   To learn more about what you can do with MyChart, go to [email or web address].   Other Instructions None

## 2024-09-17 NOTE — Progress Notes (Signed)
 " Cardiology Office Note:    Date:  09/17/2024   ID:  Lawrence Fisher, DOB [birth year], MRN [id number]  PCP:  Neysa Tinnie BRAVO, PA  Cardiologist:  Jennifer JONELLE Crape, MD   Referring MD: Neysa Tinnie BRAVO, PA    ASSESSMENT:    1. Chest pain of uncertain etiology   2. Coronary artery disease involving native coronary artery of native heart with angina pectoris   3. Hypertension associated with type 2 diabetes mellitus (HCC)   4. Obesity (BMI 30.0-34.9)   5. Smoker    PLAN:    In order of problems listed above:  Coronary artery disease: Primary prevention stressed with the patient.  Importance of compliance with diet medication stressed and patient verbalized standing. He was advised to walk at least half an hour a day on a daily basis. Mixed dyslipidemia: On lipid-lowering medications followed by primary care.  Lipids reviewed and discussed with the patient.  They are doing fine.  Diet emphasized. Diabetes mellitus: Uncontrolled and hemoglobin A1c is greater than 9.  He is talking to his primary provider and endocrinologist in the next few days to assess this.  Diet emphasized.  Compliance urged.  I urged him against a sedentary lifestyle. Cigarette smoker: I spent 5 minutes with the patient discussing solely about smoking. Smoking cessation was counseled. I suggested to the patient also different medications and pharmacological interventions. Patient is keen to try stopping on its own at this time. He will get back to me if he needs any further assistance in this matter. \ Essential hypertension: Blood pressure is stable and diet was emphasized.  Salt intake issues were discussed. Patient will be seen in follow-up appointment in 6 months or earlier if the patient has any concerns.    Medication Adjustments/Labs and Tests Ordered: Current medicines are reviewed at length with the patient today.  Concerns regarding medicines are outlined above.  Orders Placed This Encounter  Procedures   EKG  12-Lead   No orders of the defined types were placed in this encounter.    No chief complaint on file.    History of Present Illness:    Lawrence Fisher is a 54 y.o. male.  Patient has past medical history of coronary artery disease, essential hypertension, mixed dyslipidemia and diabetes mellitus.  He denies any problems at this time and takes care of activities of daily living.  No chest pain orthopnea or PND.  Overall he leads a sedentary lifestyle and does not exercise on a regular basis.  Unfortunately continues to smoke.  He is A1c is markedly elevated.  Past Medical History:  Diagnosis Date   Acquired bilateral hammer toes 11/18/2022   Angina pectoris 08/02/2023   Chronic diarrhea 08/01/2016   Chronic fatigue 10/11/2021   Class 1 obesity with serious comorbidity and body mass index (BMI) of 33.0 to 33.9 in adult 10/18/2018   Closed displaced fracture of neck of fourth metacarpal bone of right hand with routine healing 06/22/2016   Closed displaced fracture of proximal phalanx of right ring finger with routine healing 06/22/2016   Coronary artery disease with angina pectoris 10/26/2023   Diabetes mellitus without complication (HCC)    Diabetic neuropathy with neurologic complication (HCC) 10/18/2018   Erectile dysfunction due to diabetes mellitus (HCC) 06/02/2021   Generalized abdominal pain 08/01/2016   Heel cord tightness, right 03/27/2023   History of diabetic ulcer of foot 01/09/2023   Hypertension    Hypertension associated with type 2 diabetes mellitus (HCC) 08/01/2016  Incontinence of feces 08/01/2016   Microalbuminuria due to type 2 diabetes mellitus (HCC) 07/10/2018   Mixed hyperlipidemia 07/10/2018   Moderate episode of recurrent major depressive disorder (HCC) 08/01/2016   Neuropathy due to type 2 diabetes mellitus (HCC) 11/18/2022   Obesity (BMI 30.0-34.9) 11/30/2023   Plantar callus 12/19/2022   Right shoulder pain 09/25/2018   Added automatically from request  for surgery [id number]     Smoker 08/01/2016   Ulcer of right foot, limited to breakdown of skin (HCC) 11/18/2022   Uncontrolled type 2 diabetes mellitus with hyperglycemia, without long-term current use of insulin  (HCC) 12/29/2015   Unspecified dislocation of right shoulder joint, initial encounter 05/21/2018    MRI right shoulder-05/29/2018- changes consistent with history of anterior dislocation with subacute Hill-Sachs impaction fracture of the proximal humerus, displaced glenoid fracture and Bankart lesion with associated intra-articular loose bodies and extensive labral tearing, rotator cuff tendinopathy without tear, 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     Vitamin D deficiency 10/20/2021    Past Surgical History:  Procedure Laterality Date   ANKLE SURGERY     CHOLECYSTECTOMY     CORONARY PRESSURE/FFR WITH 3D MAPPING N/A 11/02/2023   Procedure: Coronary Pressure/FFR w/3D Mapping;  Surgeon: Wendel Lurena POUR, MD;  Location: [facility];  Service: Cardiovascular;  Laterality: N/A;   LEFT HEART CATH AND CORONARY ANGIOGRAPHY N/A 11/02/2023   Procedure: LEFT HEART CATH AND CORONARY ANGIOGRAPHY;  Surgeon: Wendel Lurena POUR, MD;  Location: [facility];  Service: Cardiovascular;  Laterality: N/A;   OPEN REDUCTION INTERNAL FIXATION (ORIF) METACARPAL Right 06/02/2016   Procedure: OPEN REDUCTION INTERNAL FIXATION (ORIF) right ring phalanx and  METACARPAL fractures;  Surgeon: Lawrence Curia, MD;  Location: [facility];  Service: Orthopedics;  Laterality: Right;  OPEN REDUCTION INTERNAL FIXATION (ORIF) right ring phalanx and  METACARPAL fractures   TONSILLECTOMY      Current Medications: Active Medications[1]   Allergies:   Metformin    Social History   Socioeconomic History   Marital status: Married    Spouse name: Not on file   Number of children: Not on file   Years of education: Not on file   Highest education level: Not on file  Occupational History   Not on file   Tobacco Use   Smoking status: Every Day    Current packs/day: 0.50    Types: Cigarettes   Smokeless tobacco: Never  Vaping Use   Vaping status: Never Used  Substance and Sexual Activity   Alcohol use: Yes    Alcohol/week: 0.0 standard drinks of alcohol    Comment: occ   Drug use: Yes    Types: Marijuana    Comment: denies   Sexual activity: Not on file  Other Topics Concern   Not on file  Social History Narrative   ** Merged History Encounter **       Social Drivers of Health   Tobacco Use: High Risk (09/17/2024)   Patient History    Smoking Tobacco Use: Every Day    Smokeless Tobacco Use: Never    Passive Exposure: Not on file  Financial Resource Strain: Low Risk (10/03/2023)   Received from [facility]   Overall Financial Resource Strain (CARDIA)    Difficulty of Paying Living Expenses: Not hard at all  Food Insecurity: Low Risk (12/24/2023)   Received from [facility]   Epic    Within the past 12 months, you worried that your food would run out before you  got money to buy more: Never true    Within the past 12 months, the food you bought just didn't last and you didn't have money to get more. : Patient declined to answer  Transportation Needs: No Transportation Needs (12/24/2023)   Received from [facility]    In the past 12 months, has lack of reliable transportation kept you from medical appointments, meetings, work or from getting things needed for daily living? : No  Physical Activity: Not on file  Stress: Not on file  Social Connections: Not on file  Depression (EYV7-0): Not on file  Alcohol Screen: Not on file  Housing: Low Risk (12/24/2023)   Received from [facility]   Epic    What is your living situation today?: I have a steady place to live    Think about the place you live. Do you have problems with any of the following? Choose all that apply:: None/None on this list  Utilities: Low Risk (12/24/2023)   Received from [facility]    Utilities    In the past 12 months has the electric, gas, oil, or water company threatened to shut off services in your home? : No  Health Literacy: Not on file     Family History: The patient's family history includes Diabetes in his father and mother; Heart disease in his father and mother; Hypertension in his father and mother. There is no history of Cancer.  ROS:   Please see the history of present illness.    All other systems reviewed and are negative.  EKGs/Labs/Other Studies Reviewed:    The following studies were reviewed today: .SABRAEKG Interpretation Date/Time:  Tuesday September 17 2024 11:02:55 EST Ventricular Rate:  103 PR Interval:  138 QRS Duration:  86 QT Interval:  354 QTC Calculation: 463 R Axis:   75  Text Interpretation: Sinus tachycardia When compared with ECG of 26-Oct-2023 08:03, T wave inversion no longer evident in Inferior leads Confirmed by Edwyna Backers [phone]) on 09/17/2024 11:08:15 AM     Recent Labs: 10/26/2023: ALT 23; BUN 13; Creatinine, Ser 1.06; Hemoglobin 17.0; Platelets 252; Potassium 4.9; Sodium 139  Recent Lipid Panel    Component Value Date/Time   CHOL 152 11/17/2021 0832   TRIG 160.0 (H) 11/17/2021 0832   HDL 43.00 11/17/2021 0832   CHOLHDL 4 11/17/2021 0832   VLDL 32.0 11/17/2021 0832   LDLCALC 77 11/17/2021 0832    Physical Exam:    VS:  BP 138/88   Pulse (!) 103   Ht 6' 6.5 (1.994 m)   Wt 289 lb (131.1 kg)   SpO2 95%   BMI 32.97 kg/m     Wt Readings from Last 3 Encounters:  09/17/24 289 lb (131.1 kg)  06/24/24 290 lb (131.5 kg)  12/27/23 291 lb 6.4 oz (132.2 kg)     GEN: Patient is in no acute distress HEENT: Normal NECK: No JVD; No carotid bruits LYMPHATICS: No lymphadenopathy CARDIAC: Hear sounds regular, 2/6 systolic murmur at the apex. RESPIRATORY:  Clear to auscultation without rales, wheezing or rhonchi  ABDOMEN: Soft, non-tender, non-distended MUSCULOSKELETAL:  No edema; No deformity  SKIN: Warm and  dry NEUROLOGIC:  Alert and oriented x 3 PSYCHIATRIC:  Normal affect   Signed, Backers JONELLE Edwyna, MD  09/17/2024 11:26 AM    [facility]     [1]  Current Meds  Medication Sig   amLODipine (NORVASC) 10 MG tablet Take 10 mg by mouth daily.  aspirin  EC 81 MG tablet Take 1 tablet (81 mg total) by mouth daily. Swallow whole.   atorvastatin (LIPITOR) 80 MG tablet Take 80 mg by mouth daily.   BAYER MICROLET LANCETS lancets Use as instructed to check blood sugar 3 times per day dx code E11.65   Continuous Blood Gluc Transmit (DEXCOM G6 TRANSMITTER) MISC Use one every 90 days   Continuous Glucose Sensor (DEXCOM G7 SENSOR) MISC 1 Device by Does not apply route continuous.   dapagliflozin  propanediol (FARXIGA ) 10 MG TABS tablet Take 1 tablet (10 mg total) by mouth daily before breakfast.   glimepiride  (AMARYL ) 4 MG tablet Take 1 tablet (4 mg total) by mouth daily before breakfast.   glucose blood (BAYER CONTOUR NEXT TEST) test strip Use as instructed to check blood sugar 3 times per day Dx code E11.65   Insulin  Asp Prot & Asp FlexPen (NOVOLOG  70/30 MIX) (70-30) 100 UNIT/ML FlexPen INJECT 30 UNITS BEFORE BREAKFAST AND 30 UNITS BEFORE SUPPER   Insulin  Pen Needle 32G X 4 MM MISC Use to inject insulin  2 times a day.   isosorbide  mononitrate (IMDUR ) 30 MG 24 hr tablet TAKE 1 TABLET BY MOUTH DAILY   metoprolol  succinate (TOPROL -XL) 50 MG 24 hr tablet Take 50 mg by mouth daily.   Multiple Vitamins-Minerals (ADULT GUMMY PO) Take 1 capsule by mouth daily.   nitroGLYCERIN  (NITROSTAT ) 0.4 MG SL tablet Place 0.4 mg under the tongue every 5 (five) minutes as needed for chest pain.   Semaglutide , 1 MG/DOSE, 4 MG/3ML SOPN Inject 1 mg as directed once a week.   sertraline (ZOLOFT) 100 MG tablet Take 150 mg by mouth daily.   Vitamin D, Ergocalciferol, (DRISDOL) 1.25 MG (50000 UNIT) CAPS capsule Take 50,000 Units by mouth every Tuesday.   "

## 2024-10-03 ENCOUNTER — Ambulatory Visit: Admitting: Endocrinology
